# Patient Record
Sex: Female | Born: 1960 | Race: White | Hispanic: No | Smoking: Former smoker
Health system: Southern US, Community
[De-identification: ages and names within clinical notes are randomized; demographics above are authoritative.]

## PROBLEM LIST (undated history)

## (undated) DIAGNOSIS — J309 Allergic rhinitis, unspecified: Secondary | ICD-10-CM

## (undated) DIAGNOSIS — J45909 Unspecified asthma, uncomplicated: Secondary | ICD-10-CM

## (undated) DIAGNOSIS — R053 Chronic cough: Secondary | ICD-10-CM

## (undated) DIAGNOSIS — R112 Nausea with vomiting, unspecified: Secondary | ICD-10-CM

## (undated) DIAGNOSIS — E039 Hypothyroidism, unspecified: Secondary | ICD-10-CM

## (undated) DIAGNOSIS — Z9889 Other specified postprocedural states: Secondary | ICD-10-CM

## (undated) DIAGNOSIS — IMO0002 Reserved for concepts with insufficient information to code with codable children: Secondary | ICD-10-CM

## (undated) DIAGNOSIS — D219 Benign neoplasm of connective and other soft tissue, unspecified: Secondary | ICD-10-CM

## (undated) DIAGNOSIS — R87619 Unspecified abnormal cytological findings in specimens from cervix uteri: Secondary | ICD-10-CM

## (undated) DIAGNOSIS — M1990 Unspecified osteoarthritis, unspecified site: Secondary | ICD-10-CM

## (undated) HISTORY — DX: Unspecified abnormal cytological findings in specimens from cervix uteri: R87.619

## (undated) HISTORY — PX: KNEE SURGERY: SHX244

## (undated) HISTORY — DX: Unspecified osteoarthritis, unspecified site: M19.90

## (undated) HISTORY — DX: Benign neoplasm of connective and other soft tissue, unspecified: D21.9

## (undated) HISTORY — DX: Reserved for concepts with insufficient information to code with codable children: IMO0002

## (undated) HISTORY — PX: APPENDECTOMY: SHX54

---

## 1999-12-12 HISTORY — PX: APPENDECTOMY: SHX54

## 2005-03-13 HISTORY — PX: OTHER SURGICAL HISTORY: SHX169

## 2005-09-29 ENCOUNTER — Ambulatory Visit: Payer: Self-pay | Admitting: Gynecology

## 2005-10-09 ENCOUNTER — Ambulatory Visit: Payer: Self-pay | Admitting: Gynecology

## 2005-10-09 ENCOUNTER — Encounter (INDEPENDENT_AMBULATORY_CARE_PROVIDER_SITE_OTHER): Payer: Self-pay | Admitting: Gynecology

## 2006-01-04 ENCOUNTER — Ambulatory Visit: Payer: Self-pay | Admitting: Specialist

## 2006-01-26 ENCOUNTER — Ambulatory Visit: Payer: Self-pay | Admitting: Specialist

## 2006-09-27 ENCOUNTER — Encounter: Payer: Self-pay | Admitting: Obstetrics & Gynecology

## 2006-09-27 ENCOUNTER — Ambulatory Visit: Payer: Self-pay | Admitting: Obstetrics & Gynecology

## 2006-10-01 ENCOUNTER — Ambulatory Visit: Payer: Self-pay | Admitting: Obstetrics & Gynecology

## 2006-10-01 ENCOUNTER — Encounter: Admission: RE | Admit: 2006-10-01 | Discharge: 2006-10-01 | Payer: Self-pay | Admitting: Obstetrics & Gynecology

## 2006-10-10 ENCOUNTER — Encounter: Admission: RE | Admit: 2006-10-10 | Discharge: 2006-10-10 | Payer: Self-pay | Admitting: Obstetrics & Gynecology

## 2006-10-18 ENCOUNTER — Ambulatory Visit: Payer: Self-pay | Admitting: Obstetrics & Gynecology

## 2006-10-25 ENCOUNTER — Ambulatory Visit: Payer: Self-pay | Admitting: Obstetrics & Gynecology

## 2007-05-07 ENCOUNTER — Encounter: Payer: Self-pay | Admitting: Obstetrics & Gynecology

## 2007-05-07 ENCOUNTER — Ambulatory Visit: Payer: Self-pay | Admitting: Obstetrics & Gynecology

## 2007-08-12 ENCOUNTER — Ambulatory Visit: Payer: Self-pay | Admitting: Family Medicine

## 2007-10-08 ENCOUNTER — Encounter: Admission: RE | Admit: 2007-10-08 | Discharge: 2007-10-08 | Payer: Self-pay | Admitting: Gynecology

## 2007-11-19 ENCOUNTER — Encounter (INDEPENDENT_AMBULATORY_CARE_PROVIDER_SITE_OTHER): Payer: Self-pay | Admitting: Gynecology

## 2007-11-19 ENCOUNTER — Ambulatory Visit: Payer: Self-pay | Admitting: Gynecology

## 2008-09-15 ENCOUNTER — Ambulatory Visit: Payer: Self-pay | Admitting: Family Medicine

## 2008-10-21 ENCOUNTER — Encounter: Admission: RE | Admit: 2008-10-21 | Discharge: 2008-10-21 | Payer: Self-pay | Admitting: Obstetrics & Gynecology

## 2008-11-24 ENCOUNTER — Ambulatory Visit: Payer: Self-pay | Admitting: Family Medicine

## 2008-11-24 ENCOUNTER — Encounter: Payer: Self-pay | Admitting: Family Medicine

## 2009-10-27 ENCOUNTER — Encounter: Admission: RE | Admit: 2009-10-27 | Discharge: 2009-10-27 | Payer: Self-pay | Admitting: Obstetrics & Gynecology

## 2010-03-01 ENCOUNTER — Ambulatory Visit: Payer: Self-pay | Admitting: Family Medicine

## 2010-03-01 ENCOUNTER — Encounter: Payer: Self-pay | Admitting: Family Medicine

## 2010-03-01 HISTORY — PX: COLPOSCOPY: SHX161

## 2010-03-01 LAB — CONVERTED CEMR LAB
ALT: 17 units/L (ref 0–35)
AST: 19 units/L (ref 0–37)
Albumin: 4.5 g/dL (ref 3.5–5.2)
Alkaline Phosphatase: 73 units/L (ref 39–117)
BUN: 13 mg/dL (ref 6–23)
CO2: 26 meq/L (ref 19–32)
Calcium: 8.9 mg/dL (ref 8.4–10.5)
Chloride: 105 meq/L (ref 96–112)
Creatinine, Ser: 0.7 mg/dL (ref 0.40–1.20)
Glucose, Bld: 83 mg/dL (ref 70–99)
HCT: 41.2 % (ref 36.0–46.0)
Hemoglobin: 13.5 g/dL (ref 12.0–15.0)
MCHC: 32.8 g/dL (ref 30.0–36.0)
MCV: 89.4 fL (ref 78.0–100.0)
Platelets: 311 10*3/uL (ref 150–400)
Potassium: 4.1 meq/L (ref 3.5–5.3)
RBC: 4.61 M/uL (ref 3.87–5.11)
RDW: 13.2 % (ref 11.5–15.5)
Sodium: 141 meq/L (ref 135–145)
TSH: 3.294 microintl units/mL (ref 0.350–4.500)
Total Bilirubin: 0.5 mg/dL (ref 0.3–1.2)
Total Protein: 6.8 g/dL (ref 6.0–8.3)
WBC: 9.5 10*3/uL (ref 4.0–10.5)

## 2010-04-03 ENCOUNTER — Encounter: Payer: Self-pay | Admitting: Obstetrics & Gynecology

## 2010-07-26 NOTE — Assessment & Plan Note (Signed)
NAME:  Danielle Duarte, Danielle Duarte                ACCOUNT NO.:  [id number]   MEDICAL RECORD NO.:  [id number]          PATIENT TYPE:  POB   LOCATION:  [facility]         FACILITY:  [facility]   PHYSICIAN:  Tinnie Gens, MD        DATE OF BIRTH:  08/15/1960   DATE OF SERVICE:  08/12/2007                                  CLINIC NOTE   CHIEF COMPLAINT:  Repeat IUD insertion.   HISTORY OF PRESENT ILLNESS:  The patient is a 50 year old gravida  __________ , para 2, who has an IUD in for the last 5 years who desires  repeat.  She has no specific complaints today.  The patient has a  history of abnormal Pap smear, underwent a colposcopy in August 2008,  had a repeat Pap in February 2009, which showed benign reparative  changes, is due for a followup in August 2009.   PHYSICAL EXAMINATION:  VITAL SIGNS:  As noted in the chart.  GENERAL:  She is a well-developed, well-nourished female in no acute  distress.  ABDOMEN:  Soft, nontender, and nondistended.  GU:  Normal external female genitalia.  BUS normal.  Vagina is pink and  rugated.  Cervix is parous without lesion.  Strings are not visualized.   PROCEDURE:  A curved Nicholaus Bloom was used to reach the internal os.  The  strings were easily grasped.  The IUD removed without difficulty.  The  cervix was then cleaned with Betadine x2.  Single-tooth tenaculum was  applied to the anterior cervix.  The uterus sounded to 9 cm, Mirena IUD  was inserted without difficulty.  Strings were trimmed to 2 cm.  Bleeding at the point of the single-tooth tenaculum site was stopped  with pressure from __________ .  The patient tolerated the procedure  well.   IMPRESSION:  Intrauterine device removal with reinsertion.   PLAN:  1. Followup Pap in August 2009.  2. The patient is advised to check her strings over the next 2 weeks.           ______________________________  Tinnie Gens, MD     TP/MEDQ  D:  08/12/2007  T:  08/13/2007  Job:  [id number]

## 2010-07-26 NOTE — Assessment & Plan Note (Signed)
NAME:  Danielle Duarte, SKILTON NO.:  [id number]   MEDICAL RECORD NO.:  [id number]          PATIENT TYPE:  POB   LOCATION:  [facility]         FACILITY:  [facility]   PHYSICIAN:  Elsie Lincoln, MD      DATE OF BIRTH:  1961/01/16   DATE OF SERVICE:                                  CLINIC NOTE   The patient is a 50 year old female who presents for followup of colpo.  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  Her colpo showed  Nabothian cysts and mild acetowhite.  The biopsy showed cervicitis.  This could be secondary to her IUD.  We had a detailed conversation  about HPV both for her and her daughter, and the patient has a better  understanding.  She is going to stay away from secondhand smoke now to  hopefully increase her immune system so she can clear the virus.  The  patient also had a BI-RADS-0 screening mammogram on 10/01/06.  She had  follow-up views of the left breast within normal limits, per the  patient.  We need this report.  She just started Weight Watchers which  is also going to help her elevated LDL cholesterol.  This patient needs  to come back in 6 months for a follow-up Pap smear.           ______________________________  Elsie Lincoln, MD     KL/MEDQ  D:  10/25/2006  T:  10/26/2006  Job:  [id number]

## 2010-07-26 NOTE — Assessment & Plan Note (Signed)
NAME:  Danielle Duarte, Danielle Duarte NO.:  [id number]   MEDICAL RECORD NO.:  [id number]          PATIENT TYPE:  POB   LOCATION:  [facility]         FACILITY:  [facility]   PHYSICIAN:  Ginger Carne, MD DATE OF BIRTH:  [birth year]   DATE OF SERVICE:  11/19/2007                                  CLINIC NOTE   Danielle Duarte is a 50 year old Caucasian female here for routine yearly  examination.  She has no specific complaints and had a Mirena IUD  inserted on August 12, 2007.  Her menses are very light and she is  satisfied with said IUD.  Otherwise, she denies cardiovascular,  genitourinary, gastrointestinal, or gynecologic complaints.  The patient  is on no medications.   SALIENT PHYSICAL FINDINGS:  VITAL SIGNS:  Blood pressure is 132/80,  weight 152 pounds, height 5 feet 4 inches, and pulse 61 and regular.  HEENT:  Grossly normal.  BREASTS:  Without masses, discharge, thickenings, or tenderness.  CHEST:  Clear to percussion and auscultation.  CARDIOVASCULAR:  Without murmurs or enlargements.  Regular rate and  rhythm.  EXTREMITIES:  Within normal limits.  LYMPHATICS:  Within normal limits.  SKIN:  Within normal limits.  NEUROLOGIC:  Within normal limits.  MUSCULOSKELETAL:  Within normal limits.  ABDOMEN:  Soft without gross hepatosplenomegaly.  PELVIC:  Pap smear performed.  External genitalia, vulva, and vagina are  normal.  Cervix smooth without erosions or lesions.  Uterus is small,  anteverted, and flexed.  Both adnexa palpable and found to be normal.  Pap smear performed.   IMPRESSION:  Normal gynecologic exam.   PLAN:  The patient had a normal mammogram performed on October 08, 2007.  She was advised that after age of 55, it is recommended to have a  screening colonoscopy.  Otherwise, no other issues have arisen during  this examination.  She will return in 1 year.           ______________________________  Ginger Carne, MD     SHB/MEDQ  D:  11/19/2007   T:  11/20/2007  Job:  [id number]

## 2010-07-26 NOTE — Assessment & Plan Note (Signed)
NAME:  Danielle Duarte, Danielle Duarte NO.:  [id number]   MEDICAL RECORD NO.:  [id number]          PATIENT TYPE:  POB   LOCATION:  [facility]         FACILITY:  [facility]   PHYSICIAN:  Elsie Lincoln, MD      DATE OF BIRTH:  [birth year]   DATE OF SERVICE:  09/27/2006                                  CLINIC NOTE   Patient is a 50 year old para 2 female who presents for her annual exam.  She has an IUD in place for 4 years and it is due to come out next year.  She would like another one replaced and I told her that this is safe.  She is not having any hot flashes or flushes.  She has had a difficult  year this past calendar year secondary to knee pain and problems in  surgery and rehab from that.  She would like to lose some weight and is  going to look into water walking since her knee is causing her problems.  She does have an occasional loss of urine and she was instructed on  Kegel.  She is not interested in surgical intervention at this time.  She does complain of diarrhea after eating most ethnic type meals,  however chronic diarrhea is not normal and needs to be investigated.  Her dad has just been diagnosed with Crohn disease so this could be  worrisome for her as well.  The patient also complains of dark hairs on  her nipples and I explained to her that this can be normal, although  bothersome.  I referred her to [facility] in  [place].   PAST MEDICAL HISTORY:  Denies any major problems.   PAST SURGICAL HISTORY:  Knee surgery and an appendectomy.   GYN HISTORY:  Questionable history of ovarian cyst, fibroid tumors  during her last pregnancy, however, she cannot remember if it was on her  uterus or her ovary.  She has had 2 vaginal deliveries.  She is sexually  active and not having problems.  Mirena for contraception.  No history  of abnormal pap smears.   FAMILY HISTORY:  New diagnosis of Crohn's in her father.   MEDICATIONS:  None.   ALLERGIES:  None.   REVIEW OF SYSTEMS:  As above, otherwise negative.   Pulse 66, blood pressure 134/80, weight 172, height 5 foot 4.  GENERAL:  Well-nourished, well-developed, no apparent distress.  HEENT:  Normocephalic, atraumatic, good dentition.  THYROID:  No masses.  LUNGS:  Clear to auscultation bilaterally.  HEART:  Regular rate and rhythm.  BREASTS:  No masses, nontender, no skin changes.  Self breast exam  encouraged.  ABDOMEN:  Soft, nontender, slightly obese, no hernia, no organomegaly.  GENITALIA:  Tanner 5; vagina pink, normal rugae; not able to visualize  IUD strings but can feel them just inside the endocervical canal.  Uterus slightly enlarged, mobile, nontender.  Adnexa no masses,  nontender.  Rectovaginal:  no masses.  No hemorrhoids.  No cystocele, no  rectocele.   ASSESSMENT AND PLAN:  A 50 year old female for a well woman exam:  1. Pap smear.  2. Go to primary care doctor for  evaluation of diarrhea.  3. Calcium supplementation reviewed.  4. Mammogram ordered.  5. Patient wants to come in for cholesterol, TSH and glucose      screening.  6. Refer to laser for hair removal.  7. Kegel's for loss of urine.  8. Return to clinic in a year.           ______________________________  Elsie Lincoln, MD     KL/MEDQ  D:  09/27/2006  T:  09/27/2006  Job:  [id number]

## 2010-07-26 NOTE — Assessment & Plan Note (Signed)
NAME:  Danielle Duarte, BERTONI NO.:  [id number]   MEDICAL RECORD NO.:  [id number]          PATIENT TYPE:  POB   LOCATION:  [facility]         FACILITY:  [facility]   PHYSICIAN:  Tinnie Gens, MD        DATE OF BIRTH:  04/07/1960   DATE OF SERVICE:  03/01/2010                                  CLINIC NOTE   CHIEF COMPLAINT:  Yearly exam.   HISTORY OF PRESENT ILLNESS:  The patient is a 50 year old gravida 2,  para 2 who was seen a year ago.  She is doing well.  She had her IUD  removed then because of fear of breast cancer and her cycles have come  back.  Previously, prior to getting her IUD put in, they are heavy.  She  bleeds for approximately 7 days and are monthly.  She has had abnormal  thyroid function testing over the summer and would like to have this  repeated.  She had a mammogram in August which was normal.   PAST MEDICAL HISTORY:  Negative.   PAST SURGICAL HISTORY:  Knee surgery and appendectomy.   MEDICATIONS:  None.   ALLERGIES:  None known.   OBSTETRICAL HISTORY:  Vaginal delivery x2.   GYN HISTORY:  Questionable history of fibroid tumor.   FAMILY HISTORY:  Crohn disease.   SOCIAL HISTORY:  No tobacco, alcohol or drug use.  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   Fourteen-point review of systems is reviewed and she is negative for  chest pain, shortness of breath, abdominal pain, nausea, vomiting,  diarrhea, constipation, blood in her stool, blood in her urine, vaginal  discharge, breast mass or lump.   PHYSICAL EXAMINATION:  VITAL SIGNS:  Noted in the chart.  GENERAL:  She is a well-developed, well-nourished female in no acute  stress.  HEENT:  Normocephalic, atraumatic.  Sclerae anicteric.  NECK:  Supple.  Normal thyroid.  LUNGS:  Clear bilaterally.  CV:  Regular rate and rhythm.  No rubs, gallops, murmurs.  ABDOMEN:  Soft, nontender, nondistended.  GU:  Normal external female genitalia.  BUS normal.  Vagina is pink and  rugated.  Cervix is parous with cervical polyp noted.  Uterus is small,  anteverted.  No adnexal mass or tenderness.  EXTREMITIES:  No cyanosis, clubbing or edema.  BREASTS:  Symmetric with everted nipples.  No masses.  No  supraclavicular or axillary adenopathy.   PROCEDURE:  Cervical polyp was removed by ring forceps with constant  twisting.   IMPRESSION:  1. Physical exam.  2. Cervical polyp status post removal.  3. Continue monthly cycles.  4. History of abnormal thyroid testing.   PLAN:  1. Pap smear today.  2. Yearly mammography.  3. Referral for colonoscopy.  4. TSH, CBC and CMP today.  5. Cervical polyp to Pathology.           ______________________________  Tinnie Gens, MD     TP/MEDQ  D:  03/01/2010  T:  03/02/2010  Job:  [id number]

## 2010-07-26 NOTE — Assessment & Plan Note (Signed)
NAME:  Danielle Duarte, Danielle Duarte NO.:  [id number]   MEDICAL RECORD NO.:  [id number]          PATIENT TYPE:  POB   LOCATION:  [facility]         FACILITY:  [facility]   PHYSICIAN:  Tinnie Gens, MD        DATE OF BIRTH:  September 14, 1960   DATE OF SERVICE:                                  CLINIC NOTE   CHIEF COMPLAINT:  Physical exam.   HISTORY OF PRESENT ILLNESS:  The patient is a 50 year old gravida 2,  para 2 who has had an IUD for the past 7 years who desires to have this  removed.  She wants it removed because her sister was diagnosed with  breast cancer.  She has been without cycles, so she had her first IUD  put in.  She is otherwise without significant complaint today.  She had  a mammogram in August, which was normal and she knows she needs a  colposcopy this year.   PAST MEDICAL HISTORY:  Negative.   PAST SURGICAL HISTORY:  Knee surgery and appendectomy.   MEDICATIONS:  None.   ALLERGIES:  None.   OBSTETRICAL HISTORY:  Two previous vaginal deliveries.   GYN HISTORY:  Questionable history of fibroid tumors.   FAMILY HISTORY:  Crohn's disease.   SOCIAL HISTORY:  No tobacco, alcohol, or drug use.  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   A 14-point review of systems reviewed, negative for breast mass or lump,  chest pain, shortness of breath, abdominal pain, blood in her stool,  blood in her urine, vaginal bleeding, vaginal discharge, nausea,  vomiting, diarrhea, constipation.   PHYSICAL EXAMINATION:  VITAL SIGNS:  Blood pressure is 134/81, weight is  165.  GENERAL:  She is a well-nourished female in no acute stress.  HEENT:  Normocephalic, atraumatic.  Sclerae anicteric.  NECK:  Supple.  Normal thyroid.  LUNGS:  Clear bilaterally.  CV:  Regular rate and rhythm.  No rubs, gallops, murmurs.  ABDOMEN:  Soft, nontender, nondistended.  EXTREMITIES:  No cyanosis, clubbing, or edema.  BREASTS:  Symmetric with everted nipples.  No  masses.  No  supraclavicular or axillary adenopathy.  GU:  Normal external female genitalia.  BUS normal.  Vagina is pink and  rugated.  Cervix is parous without lesion.  Uterus is small, anteverted.  No adnexal mass or tenderness.   IMPRESSION:  Yearly physical exam and gynecologic exam.  There is a new  family diagnosis of breast cancer, not blood related   PLAN:  1. Pap smear today.  2. Mammogram yearly.  3. Colonoscopy yearly.   PROCEDURE:  The patient desires removal of her IUD.  At that time a Pap  smear, the speculum showed 2 cm IUD string.  This was grasped with a  ring forceps and IUD removed intact and without difficulty.  The patient  tolerated the procedure well.           ______________________________  Tinnie Gens, MD     TP/MEDQ  D:  11/24/2008  T:  11/25/2008  Job:  [id number]

## 2010-10-10 HISTORY — PX: COLONOSCOPY: SHX174

## 2010-10-10 LAB — HM COLONOSCOPY

## 2011-04-04 ENCOUNTER — Other Ambulatory Visit: Payer: Self-pay | Admitting: Obstetrics and Gynecology

## 2011-04-04 DIAGNOSIS — Z1231 Encounter for screening mammogram for malignant neoplasm of breast: Secondary | ICD-10-CM

## 2011-04-25 ENCOUNTER — Ambulatory Visit
Admission: RE | Admit: 2011-04-25 | Discharge: 2011-04-25 | Disposition: A | Discharge status: Home / Self Care | Payer: BC Managed Care – PPO | Source: Ambulatory Visit | Attending: Obstetrics and Gynecology | Admitting: Obstetrics and Gynecology

## 2011-04-25 DIAGNOSIS — Z1231 Encounter for screening mammogram for malignant neoplasm of breast: Secondary | ICD-10-CM

## 2011-05-02 ENCOUNTER — Encounter: Payer: Self-pay | Admitting: Obstetrics and Gynecology

## 2011-05-02 ENCOUNTER — Ambulatory Visit (INDEPENDENT_AMBULATORY_CARE_PROVIDER_SITE_OTHER): Payer: BC Managed Care – PPO | Admitting: Obstetrics and Gynecology

## 2011-05-02 VITALS — BP 119/83 | HR 75 | Ht 65.0 in | Wt 179.0 lb

## 2011-05-02 DIAGNOSIS — Z1151 Encounter for screening for human papillomavirus (HPV): Secondary | ICD-10-CM

## 2011-05-02 DIAGNOSIS — Z01419 Encounter for gynecological examination (general) (routine) without abnormal findings: Secondary | ICD-10-CM

## 2011-05-02 DIAGNOSIS — Z1272 Encounter for screening for malignant neoplasm of vagina: Secondary | ICD-10-CM

## 2011-05-02 NOTE — Progress Notes (Signed)
Addended by: Barbara Cower on: 05/02/2011 05:06 PM   Modules accepted: Orders

## 2011-05-02 NOTE — Progress Notes (Signed)
  Subjective:    Patient ID: Danielle Duarte, female    DOB: 01-17-1961, 51 y.o.   MRN: [id number]  HPI  51 yo G2P2 postmenopausal with BMI here for annual exam. Patient is doing well. Patient reports losing urine with coughing on occasions. Patient is not currently sexually active.   Review of Systems  All other systems reviewed and are negative.       Objective:   Physical Exam GENERAL: Well-developed, well-nourished female in no acute distress.  HEENT: Normocephalic, atraumatic. Sclerae anicteric.  NECK: Supple. Normal thyroid.  LUNGS: Clear to auscultation bilaterally.  HEART: Regular rate and rhythm. BREASTS: Symmetric in size. No palpable masses or lymphadenopathy, skin changes, or nipple drainage. ABDOMEN: Soft, nontender, nondistended. No organomegaly. PELVIC: Normal external female genitalia. Vagina is pink and rugated.  Normal discharge. Normal appearing cervix. Uterus is normal in size. No adnexal mass or tenderness.Grade I cystocele EXTREMITIES: No cyanosis, clubbing, or edema, 2+ distal pulses.     Assessment & Plan:  51 yo here for annual exam - pap smear was performed - wet prep collected at patient request - patient advised to continue monthly self breast and vulva exam - Medical vs surgical management of stress incontinence discussed with patient. Patient desires to try Kegel exercises first and will return in 6 months to a year if she feels her incontinence is worsening.

## 2011-05-03 LAB — WET PREP, GENITAL
Trich, Wet Prep: NONE SEEN
Yeast Wet Prep HPF POC: NONE SEEN

## 2011-05-04 LAB — URINE CULTURE: Colony Count: 5000

## 2011-05-04 MED ORDER — METRONIDAZOLE 500 MG PO TABS: 500.0000 mg | ORAL_TABLET | Freq: Two times a day (BID) | ORAL | Status: DC

## 2011-05-04 NOTE — Progress Notes (Signed)
Addended by: Catalina Antigua on: 05/04/2011 08:19 AM   Modules accepted: Orders

## 2011-05-09 ENCOUNTER — Telehealth: Payer: Self-pay | Admitting: *Deleted

## 2011-05-09 MED ORDER — METRONIDAZOLE 500 MG PO TABS: 500.0000 mg | ORAL_TABLET | Freq: Two times a day (BID) | ORAL | Status: AC

## 2011-05-09 NOTE — Telephone Encounter (Signed)
Patient is having no symptoms and will hold off on treatment as she is out of town for a funeral right now and if she begins to have symptoms she will call us to get treatment.

## 2011-05-09 NOTE — Telephone Encounter (Signed)
Patient changed her mind and would like to have the medicine called into a pharmacy in [place].

## 2011-05-10 ENCOUNTER — Telehealth: Payer: Self-pay | Admitting: *Deleted

## 2011-05-10 NOTE — Telephone Encounter (Signed)
Patient needs to have rx cancled here to be able to pick it up in [place].

## 2011-05-22 ENCOUNTER — Encounter: Payer: BC Managed Care – PPO | Admitting: *Deleted

## 2011-05-22 DIAGNOSIS — Z01419 Encounter for gynecological examination (general) (routine) without abnormal findings: Secondary | ICD-10-CM

## 2011-05-23 LAB — COMPREHENSIVE METABOLIC PANEL
ALT: 18 U/L (ref 0–35)
AST: 20 U/L (ref 0–37)
Albumin: 4.1 g/dL (ref 3.5–5.2)
Alkaline Phosphatase: 61 U/L (ref 39–117)
BUN: 11 mg/dL (ref 6–23)
CO2: 25 mEq/L (ref 19–32)
Calcium: 8.5 mg/dL (ref 8.4–10.5)
Chloride: 105 mEq/L (ref 96–112)
Creat: 0.66 mg/dL (ref 0.50–1.10)
Glucose, Bld: 85 mg/dL (ref 70–99)
Potassium: 4.3 mEq/L (ref 3.5–5.3)
Sodium: 143 mEq/L (ref 135–145)
Total Bilirubin: 0.5 mg/dL (ref 0.3–1.2)
Total Protein: 6.1 g/dL (ref 6.0–8.3)

## 2011-05-23 LAB — LIPID PANEL
Cholesterol: 190 mg/dL (ref 0–200)
HDL: 63 mg/dL (ref >39)
LDL Cholesterol: 114 mg/dL — ABNORMAL HIGH (ref 0–99)
Total CHOL/HDL Ratio: 3 Ratio
Triglycerides: 64 mg/dL (ref <150)
VLDL: 13 mg/dL (ref 0–40)

## 2011-05-23 LAB — FOLLICLE STIMULATING HORMONE: FSH: 63.1 m[IU]/mL

## 2011-05-23 LAB — TSH: TSH: 4.822 u[IU]/mL — ABNORMAL HIGH (ref 0.350–4.500)

## 2011-12-08 ENCOUNTER — Ambulatory Visit (INDEPENDENT_AMBULATORY_CARE_PROVIDER_SITE_OTHER): Payer: BC Managed Care – PPO | Admitting: Obstetrics & Gynecology

## 2011-12-08 ENCOUNTER — Encounter: Payer: Self-pay | Admitting: Obstetrics & Gynecology

## 2011-12-08 VITALS — BP 135/83 | HR 51 | Ht 65.0 in | Wt 181.0 lb

## 2011-12-08 DIAGNOSIS — N949 Unspecified condition associated with female genital organs and menstrual cycle: Secondary | ICD-10-CM

## 2011-12-08 DIAGNOSIS — R7989 Other specified abnormal findings of blood chemistry: Secondary | ICD-10-CM

## 2011-12-08 DIAGNOSIS — Z01812 Encounter for preprocedural laboratory examination: Secondary | ICD-10-CM

## 2011-12-08 DIAGNOSIS — N951 Menopausal and female climacteric states: Secondary | ICD-10-CM

## 2011-12-08 DIAGNOSIS — N938 Other specified abnormal uterine and vaginal bleeding: Secondary | ICD-10-CM

## 2011-12-08 LAB — CBC
HCT: 42.1 % (ref 36.0–46.0)
Hemoglobin: 14.2 g/dL (ref 12.0–15.0)
MCH: 29.8 pg (ref 26.0–34.0)
MCHC: 33.7 g/dL (ref 30.0–36.0)
MCV: 88.4 fL (ref 78.0–100.0)
Platelets: 305 10*3/uL (ref 150–400)
RBC: 4.76 MIL/uL (ref 3.87–5.11)
RDW: 13.6 % (ref 11.5–15.5)
WBC: 7.2 10*3/uL (ref 4.0–10.5)

## 2011-12-08 LAB — POCT URINE PREGNANCY: Preg Test, Ur: NEGATIVE

## 2011-12-08 NOTE — Progress Notes (Signed)
51 y.o. G2P2 with DUB, possible perimenopausal or postmenopausal bleeding.  05/22/11 FSH elevated at 63.1, concerning for possible menopausal status.  Also TSH elevated at 4.822 concerning for thyroid dysfuntion. Patient has not gone for one year without bleeding.  This year, she had a period in 03/2011, then in July, then in September. She described changing a pad every 2 hours for the first two days of her period, denies any symptoms of anemia. No other symptoms.    I have reviewed the patient's other medical history in detail and updated the computerized patient record.  Last pap in 04/2011 was ASCUS, negative HPV, will repeat in 04/2012. Normal mammogram in 04/2011.  Recent Results   LIPID PANEL   Collection Time   05/22/11 10:05 AM      Component Value Range   Cholesterol 190  0 - 200 mg/dL   Triglycerides 64  <454 mg/dL   HDL 63  >09 mg/dL   Total CHOL/HDL Ratio 3.0     VLDL 13  0 - 40 mg/dL   LDL Cholesterol 811 (*) 0 - 99 mg/dL  COMPREHENSIVE METABOLIC PANEL   Collection Time   05/22/11 10:05 AM      Component Value Range   Sodium 143  135 - 145 mEq/L   Potassium 4.3  3.5 - 5.3 mEq/L   Chloride 105  96 - 112 mEq/L   CO2 25  19 - 32 mEq/L   Glucose, Bld 85  70 - 99 mg/dL   BUN 11  6 - 23 mg/dL   Creat 9.14  7.82 - 9.56 mg/dL   Total Bilirubin 0.5  0.3 - 1.2 mg/dL   Alkaline Phosphatase 61  39 - 117 U/L   AST 20  0 - 37 U/L   ALT 18  0 - 35 U/L   Total Protein 6.1  6.0 - 8.3 g/dL   Albumin 4.1  3.5 - 5.2 g/dL   Calcium 8.5  8.4 - 21.3 mg/dL  FOLLICLE STIMULATING HORMONE   Collection Time   05/22/11 10:05 AM      Component Value Range   FSH 63.1    TSH   Collection Time   05/22/11 10:05 AM      Component Value Range   TSH 4.822 (*) 0.350 - 4.500 uIU/mL   ENDOMETRIAL BIOPSY     The indications for endometrial biopsy were reviewed.   Risks of the biopsy including cramping, bleeding, infection, uterine perforation, inadequate specimen and need for additional procedures  were  discussed. The patient states she understands and agrees to undergo procedure today. Consent was signed. Time out was performed. Urine HCG was negative. A sterile speculum was placed in the patient's vagina and the cervix was prepped with Betadine.  The 3 mm pipelle was introduced into the endometrial cavity without difficulty to a depth of 8cm, and a moderate amount of tissue was obtained and sent to pathology. The instruments were removed from the patient's vagina. Minimal bleeding from the cervix was noted. The patient tolerated the procedure well.    Routine post-procedure instructions were given to the patient. The patient will follow up to review the results and for further management. Pelvic ultrasound ordered to rule out structural anomalies Repeat TSH, T3, T4, FSH and CBC ordered, will follow up results and manage accordingly. Routine preventative health maintenance measures emphasized. Bleeding precautions reviewed.

## 2011-12-08 NOTE — Patient Instructions (Signed)
Endometrial Biopsy This is a test in which a tissue sample (a biopsy) is taken from inside the uterus (womb). It is then looked at by a specialist under a microscope to see if the tissue is normal or abnormal. The endometrium is the lining of the uterus. This test helps determine where you are in your menstrual cycle and how hormone levels are affecting the lining of the uterus. Another use for this test is to diagnose endometrial cancer, tuberculosis, polyps, or inflammatory conditions and to evaluate uterine bleeding. PREPARATION FOR TEST No preparation or fasting is necessary. NORMAL FINDINGS No pathologic conditions. Presence of "secretory-type" endometrium 3 to 5 days before to normal menstruation. Ranges for normal findings may vary among different laboratories and hospitals. You should always check with your doctor after having lab work or other tests done to discuss the meaning of your test results and whether your values are considered within normal limits. MEANING OF TEST  Your caregiver will go over the test results with you and discuss the importance and meaning of your results, as well as treatment options and the need for additional tests if necessary. OBTAINING THE TEST RESULTS It is your responsibility to obtain your test results. Ask the lab or department performing the test when and how you will get your results. Document Released: 06/30/2004 Document Revised: 02/16/2011 Document Reviewed: 02/07/2008 Exit[employer] Patient Information 2012 [employer]. 

## 2011-12-09 LAB — T4, FREE: Free T4: 1.09 ng/dL (ref 0.80–1.80)

## 2011-12-09 LAB — T3, FREE: T3, Free: 3 pg/mL (ref 2.3–4.2)

## 2011-12-09 LAB — TSH: TSH: 3.392 u[IU]/mL (ref 0.350–4.500)

## 2011-12-09 LAB — FOLLICLE STIMULATING HORMONE: FSH: 30.3 m[IU]/mL

## 2011-12-11 ENCOUNTER — Encounter: Payer: Self-pay | Admitting: Obstetrics & Gynecology

## 2011-12-11 DIAGNOSIS — D279 Benign neoplasm of unspecified ovary: Secondary | ICD-10-CM | POA: Insufficient documentation

## 2011-12-13 ENCOUNTER — Telehealth: Payer: Self-pay | Admitting: Gynecology

## 2011-12-13 NOTE — Telephone Encounter (Signed)
Patient called office regarding her result. Patient aware of biopsy result, lab result and ultrasound.Patient is schedule to have a MRI Set up by susan Training and development officer) Pt will also come in office next Wednesday for her CA-125 Level.

## 2011-12-18 ENCOUNTER — Other Ambulatory Visit (INDEPENDENT_AMBULATORY_CARE_PROVIDER_SITE_OTHER): Payer: BC Managed Care – PPO | Admitting: *Deleted

## 2011-12-18 DIAGNOSIS — R9389 Abnormal findings on diagnostic imaging of other specified body structures: Secondary | ICD-10-CM

## 2011-12-19 LAB — CA 125: CA 125: 10.2 U/mL (ref 0.0–30.2)

## 2011-12-20 ENCOUNTER — Telehealth: Payer: Self-pay | Admitting: *Deleted

## 2011-12-20 NOTE — Telephone Encounter (Signed)
Patient is notified by phone of normal Ca-125.

## 2011-12-21 ENCOUNTER — Telehealth: Payer: Self-pay

## 2011-12-21 NOTE — Telephone Encounter (Signed)
This patient needed pre authorization for her pelvic MRI with and without [facility] called requested the pre-auth number. She was approved from 12/21/11 - 01/19/12 approval number is [id number]. I called [facility] and gave the approval information to [facility].

## 2011-12-22 ENCOUNTER — Telehealth: Payer: Self-pay

## 2011-12-22 NOTE — Telephone Encounter (Signed)
I got a call from [facility] imaging regarding this patients pre- Auth. The [employer] representatives put the incorrect test therefore I had to call [employer] and create another authorization. It was approved #[id number] 12/22/11- 01/20/12. Help by Merri Brunette

## 2011-12-28 ENCOUNTER — Telehealth: Payer: Self-pay | Admitting: *Deleted

## 2011-12-28 NOTE — Telephone Encounter (Signed)
Patient called and is having pain in her back that shoots to her knee, she is wondering if this could be related to what is going on with her ovary, the pain is on the left side and its the right ovary that is affected.  Patient was advised to go to the emergency room if the pain was that severe and she said she would rather maybe see a chiropractor.  I gave her the name of Dr. Yetta Flock in [place] and he has had someone to cancel this afternoon and will be happy to see if he can assist.

## 2012-01-02 ENCOUNTER — Ambulatory Visit (INDEPENDENT_AMBULATORY_CARE_PROVIDER_SITE_OTHER): Payer: BC Managed Care – PPO | Admitting: Obstetrics & Gynecology

## 2012-01-02 ENCOUNTER — Encounter: Payer: Self-pay | Admitting: Obstetrics & Gynecology

## 2012-01-02 VITALS — BP 140/87 | HR 55 | Ht 67.0 in | Wt 181.0 lb

## 2012-01-02 DIAGNOSIS — N83209 Unspecified ovarian cyst, unspecified side: Secondary | ICD-10-CM

## 2012-01-02 DIAGNOSIS — N393 Stress incontinence (female) (male): Secondary | ICD-10-CM

## 2012-01-02 NOTE — Progress Notes (Signed)
  Subjective:    Patient ID: Danielle Duarte, female    DOB: 07/29/1960, 51 y.o.   MRN: [id number]  HPI  She is here to discuss her work up. MRI confirms U/S findings of a 6-7 cm left ovarian cyst. CA-125 and TFTs are normal. EMBX normal. She also complains GSUI (no urge incontinence). She wears pads when she wears light colored pants.   Review of Systems     Objective:   Physical Exam  + Q tip test 1st degree cystocele  No vaginal atrophy    Assessment & Plan:  Ovarian cyst- I have offered watchful waiting versus L/S removal of left ovary GSUI- I have offered watchful waiting versus mid urethral sling. I have discussed risks of surgery including a 5% risk of causing urge incontinence.

## 2012-01-12 ENCOUNTER — Telehealth: Payer: Self-pay | Admitting: *Deleted

## 2012-01-12 NOTE — Telephone Encounter (Signed)
Patient has decided that she would like to have the surgery to have the ovarian mass removed but not the bladder repair.  She would like to have the procedure done with Dr. Macon Large at the end of November if at all possible.  Just let me know if this is ok and I will call her back and let her know what the plan is.

## 2012-01-15 NOTE — Telephone Encounter (Signed)
Patient was notified of information and made a pre op appointment for the 19th of November.

## 2012-01-15 NOTE — Telephone Encounter (Signed)
Patient will be scheduled for laparoscopic left salpingoophorectomy; surgical schedule will contact her with time and date.  However, she also needs to come in for a preoperative visit with me.

## 2012-01-20 ENCOUNTER — Encounter (HOSPITAL_COMMUNITY): Payer: Self-pay | Admitting: Pharmacist

## 2012-01-25 ENCOUNTER — Encounter: Payer: Self-pay | Admitting: Obstetrics & Gynecology

## 2012-01-25 ENCOUNTER — Ambulatory Visit (INDEPENDENT_AMBULATORY_CARE_PROVIDER_SITE_OTHER): Payer: BC Managed Care – PPO | Admitting: Obstetrics & Gynecology

## 2012-01-25 VITALS — BP 135/81 | HR 79 | Ht 65.0 in | Wt 182.0 lb

## 2012-01-25 DIAGNOSIS — N83209 Unspecified ovarian cyst, unspecified side: Secondary | ICD-10-CM

## 2012-01-25 NOTE — Progress Notes (Signed)
History:  51 y.o. G2P2 here today for discussion of surgery for large ovarian cyst. Pelvic ultrasound and MRI showed 7 cm left ovarian cyst.  No septations or nodularity noted. Normal CA-125. Patient desires surgical management and has been scheduled for laparoscopic LSO on 02/28/12. She denies any symptoms.  The following portions of the patient's history were reviewed and updated as appropriate: allergies, current medications, past family history, past medical history, past social history, past surgical history and problem list.  Review of Systems:  Pertinent items are noted in HPI.  Objective:  Physical Exam Blood pressure 135/81, pulse 79, height 5\' 5"  (1.651 m), weight 182 lb (82.555 kg). Gen: NAD Abd: Soft, nontender and nondistended Pelvic: Deferred  Assessment & Plan:  Patient desires surgical management with laparoscopic LSO.  The risks of surgery were discussed in detail with the patient including but not limited to: bleeding which may require transfusion or reoperation; infection which may require prolonged hospitalization or re-hospitalization and antibiotic therapy; injury to bowel, bladder, ureters and major vessels or other surrounding organs; need for additional procedures including laparotomy; thromboembolic phenomenon, incisional problems and other postoperative or anesthesia complications.  Patient was told that the likelihood that her condition and symptoms will be treated effectively with this surgical management was very high; the postoperative expectations were also discussed in detail. The patient also understands the alternative treatment options which were discussed in full.  All questions were answered.  Routine preoperative instructions of having nothing to eat or drink after midnight on the day prior to surgery and also coming to the hospital 1 1/2 hours prior to her time of surgery were also emphasized.  She was told she may be called for a preoperative appointment about  a week prior to surgery and will be given further preoperative instructions at that visit. Printed patient education handouts about the procedure were given to the patient to review at home.

## 2012-01-25 NOTE — Patient Instructions (Signed)
Laparoscopy Laparoscopy is a surgical procedure. It is used to diagnose and treat diseases inside the belly(abdomen). It is usually a brief, common, and relatively simple procedure. The laparoscopeis a thin, lighted, pencil-sized instrument. It is like a telescope. It is inserted into your abdomen through a small cut (incision). Your caregiver can look at the organs inside your body through this instrument. He or she can see if there is anything abnormal. Laparoscopy can be done either in a hospital or outpatient clinic. You may be given a mild sedative to help you relax before the procedure. Once in the operating room, you will be given a drug to make you sleep (general anesthesia). Laparoscopy usually lasts less than 1 hour. After the procedure, you will be monitored in a recovery area until you are stable and doing well. Once you are home, it will take 2 to 3 days to fully recover. RISKS AND COMPLICATIONS  Laparoscopy has relatively few risks. Your caregiver will discuss the risks with you before the procedure. Some problems that can occur include:  Infection.  Bleeding.  Damage to other organs.  Anesthetic side effects. PROCEDURE Once you receive anesthesia, your surgeon inflates the abdomen with a harmless gas (carbon dioxide). This makes the organs easier to see. The laparoscope is inserted into the abdomen through a small incision. This allows your surgeon to see into the abdomen. Other small instruments are also inserted into the abdomen through other small openings. Many surgeons attach a video camera to the laparoscope to enlarge the view. During a diagnostic laparoscopy, the surgeon may be looking for inflammation, infection, or cancer. Your surgeon may take tissue samples(biopsies). The samples are sent to a specialist in looking at cells and tissue samples (pathologist). The pathologist examines them under a microscope. Biopsies can help to diagnose or confirm a disease. AFTER THE  PROCEDURE   The gas is released from inside the abdomen.  The incisions are closed with stitches (sutures). Because these incisions are small (usually less than 1/2 inch), there is usually minimal discomfort after the procedure. There may be some mild discomfort in the throat. This is from the tube placed in the throat while you were sleeping. You may have some mild abdominal discomfort. There may also be discomfort from the instrument placement incisions in the abdomen.  The recovery time is shortened as long as there are no complications.  You will rest in a recovery room until stable and doing well. As long as there are no complications, you may be allowed to go home. FINDING OUT THE RESULTS OF YOUR TEST Not all test results are available during your visit. If your test results are not back during the visit, make an appointment with your caregiver to find out the results. Do not assume everything is normal if you have not heard from your caregiver or the medical facility. It is important for you to follow up on all of your test results. HOME CARE INSTRUCTIONS   Take all medicines as directed.  Only take over-the-counter or prescription medicines for pain, discomfort, or fever as directed by your caregiver.  Resume daily activities as directed.  Showers are preferred over baths.  You may resume sexual activities in 1 week or as directed.  Do not drive while taking narcotics. SEEK MEDICAL CARE IF:   There is increasing abdominal pain.  There is new pain in the shoulders (shoulder strap areas).  You feel lightheaded or faint.  You have the chills.  You or your child  has an oral temperature above 102 F (38.9 C).  There is pus-like (purulent) drainage from any of the wounds.  You are unable to pass gas or have a bowel movement.  You feel sick to your stomach (nauseous) or throw up (vomit). MAKE SURE YOU:   Understand these instructions.  Will watch your condition.  Will  get help right away if you are not doing well or get worse. Document Released: 06/05/2000 Document Revised: 05/22/2011 Document Reviewed: 02/27/2007 [facility] Patient Information 2013 [place], [place].  Unilateral Salpingo-Oophorectomy Unilateral salpingo-oophorectomy is the removal of one fallopian tube and ovary. The fallopian tubes transport the egg from the ovary to the womb (uterus). The fallopian tube is also where the sperm and egg meet and become fertilized and move down into the uterus.  Removing one tube and ovary will not:  Cause problems with your menstrual periods.  Cause problems with your sex drive (libido).  Put you into the menopause.  Give symptoms of the menopause.  Make you not able to get pregnant (sterile). There are several reasons for doing a salpingo-oophorectomy:  Infection of the tube and ovary.  There may be scar tissue of the tube and ovary (adhesions).  It may be necessary to remove the tube when the ovary has a cyst or tumor.  It may have to be done when removing the uterus.  It may have to be done when there is cancer of the tube or ovary. LET YOUR CAREGIVER KNOW ABOUT:  Allergies to food or medications.  All the medications you are taking including prescription and over-the-counter herbs, eye drops and creams.  If you are using illegal drugs or excessive alcohol.  Your smoking habits.  Previous problems with anesthesia including numbing medication.  The possibility of being pregnant.  History of blood clots or bleeding problems.  Previous surgery.  Any other medical or health problems. RISKS AND COMPLICATIONS  All surgery is associated with risks. Some of these risks are:  Injury to surrounding organs.  Bleeding.  Infection.  Blood clots in the legs or lungs.  Problems with the anesthesia.  The surgery does not help the problem.  Death. BEFORE THE PROCEDURE  Do not take aspirin or blood thinners because it can make you  bleed.  Do not eat or drink anything at least 8 hours before the surgery.  Let your caregiver know if you develop a cold or an infection.  If you are being admitted the day of surgery, arrive at least one hour before the surgery.  Arrange for help when you go home from the hospital.  If you smoke, do not smoke for at least 2 weeks before the surgery. PROCEDURE After being admitted to the hospital, you will change into a hospital gown. Then, you will be given an IV (intravenous) and a medication to relax you. Then, you will be put to sleep with an anesthetic. Any hair on your lower belly (abdomen) will be removed, and a catheter will be placed in your bladder. The fallopian tube and ovary will be removed either through 2 very small cuts (incisions) or through large incision in the lower abdomen. The blood vessels will be clamped and tied. AFTER THE PROCEDURE  You will be taken to the recovery room for 1 to 3 hours until your blood pressure, pulse and temperature are stable and you are waking up.  If you had a laparoscopy, you may be discharged in several hours.  If you had a large incision, you will  be admitted to the hospital for a day or two.  If you had a laparoscopy, you may have shoulder pain. This is not unusual. It is from air that is left in the abdomen and affects the nerve that goes from the diaphragm to the shoulder. It goes away in a day or two.  You will be given pain medication as necessary.  The intravenous and catheter will be removed before you are discharged.  Have someone available to take you home. HOME CARE INSTRUCTIONS   It is normal to be sore for a week or two. Call your caregiver if the pain is getting worse or the pain medication is not helping.  Have help when you go home for a week or so to help with the household chores.  Follow your caregiver's advice regarding diet.  Get rest and sleep.  Only take over-the-counter or prescription medicines for pain  or discomfort as directed by your caregiver.  Do not take aspirin. It can cause bleeding.  Do not drive, exercise or lift anything over 5 pounds.  Do not drink alcohol until your caregiver gives you permission.  Do not lift anything over 5 pounds.  Do not have sexual intercourse until your caregiver says it is OK.  Take your temperature twice a day and write it down.  Change the bandage (dressing) as directed.  Make and keep your follow-up appointments for postoperative care.  If you become constipated, ask your caregiver about taking a mild laxative. Drinking more liquids than usual and eating bran foods can help prevent constipation. SEEK MEDICAL CARE IF:   You have swelling or redness around the cut (incision).  You develop a rash.  You have side effects from the medication.  You feel lightheaded.  You need more or stronger medication.  You have pain, swelling or redness where the IV (intravenous) was placed. SEEK IMMEDIATE MEDICAL CARE IF:   You develop an unexplained temperature above 100 F (37.8 C).  You develop increasing belly (abdominal) pain.  You have pus coming out of the incision.  You notice a bad smell coming from the wound or dressing.  The incision is separating.  There is excessive vaginal bleeding.  You start to feel sick to your stomach (nauseous) and vomit.  You have leg or chest pain.  You have pain when you urinate.  You develop shortness of breath.  You pass out. Document Released: 12/25/2008 Document Revised: 05/22/2011 Document Reviewed: 12/25/2008 [facility] Patient Information 2013 [place], [place].

## 2012-01-30 ENCOUNTER — Ambulatory Visit: Payer: BC Managed Care – PPO | Admitting: Obstetrics & Gynecology

## 2012-02-20 ENCOUNTER — Ambulatory Visit (INDEPENDENT_AMBULATORY_CARE_PROVIDER_SITE_OTHER): Payer: BC Managed Care – PPO | Admitting: Obstetrics & Gynecology

## 2012-02-20 ENCOUNTER — Encounter: Payer: Self-pay | Admitting: Obstetrics & Gynecology

## 2012-02-20 VITALS — BP 140/90 | HR 72 | Ht 65.0 in | Wt 181.0 lb

## 2012-02-20 DIAGNOSIS — N83209 Unspecified ovarian cyst, unspecified side: Secondary | ICD-10-CM

## 2012-02-20 NOTE — Patient Instructions (Signed)
Return to clinic for any scheduled appointments or for any gynecologic concerns as needed.   

## 2012-02-20 NOTE — Progress Notes (Signed)
History:   51 y.o. G2P2 here today for followup; has been scheduled for laparoscopic LSO on 02/28/12 for large ovarian cyst. Pelvic ultrasound and MRI showed 7 cm left ovarian cyst. No septations or nodularity noted. Normal CA-125. She denies any symptoms.   Past Medical History  Diagnosis Date  . Abnormal Pap smear   . Diarrhea   . Fibroids   . Kidney infection   . Arthritis     right knee   Past Surgical History  Procedure Date  . Knee surgery   . Appendectomy   . Colposcopy 03/01/10  . Colonoscopy 10/10/2010   The following portions of the patient's history were reviewed and updated as appropriate: allergies, current medications, past family history, past medical history, past social history, past surgical history and problem list.   Review of Systems:   Pertinent items are noted in HPI.   Objective:   Physical Exam  Blood pressure 140/90, pulse 72, height 5\' 5"  (1.651 m), weight 181 lb (82.101 kg). Gen: NAD Lungs: CTAB Heart: RRR  Abd: Soft, nontender and nondistended  Pelvic: Deferred   Assessment & Plan:   Patient is scheduled for laparoscopic LSO on 02/28/12. The risks of surgery were re-discussed in detail with the patient including but not limited to: bleeding which may require transfusion or reoperation; infection which may require prolonged hospitalization or re-hospitalization and antibiotic therapy; injury to bowel, bladder, ureters and major vessels or other surrounding organs; need for additional procedures including laparotomy; thromboembolic phenomenon, incisional problems and other postoperative or anesthesia complications. Patient was told that the likelihood that her condition and symptoms will be treated effectively with this surgical management was very high; the postoperative expectations were also discussed in detail. The patient also understands the alternative treatment options which were discussed in full. All questions were answered. Routine preoperative  instructions of having nothing to eat or drink after midnight on the day prior to surgery and also coming to the hospital 1 1/2 hours prior to her time of surgery were also emphasized. She has a preoperative appointment soon, other preoperative instructions will be given at that visit.  Will see her again on the day of surgery and address any further concerns prior to surgery.

## 2012-02-22 ENCOUNTER — Encounter (HOSPITAL_COMMUNITY): Payer: Self-pay

## 2012-02-22 ENCOUNTER — Encounter (HOSPITAL_COMMUNITY)
Admission: RE | Admit: 2012-02-22 | Discharge: 2012-02-22 | Disposition: A | Discharge status: Home / Self Care | Payer: BC Managed Care – PPO | Source: Ambulatory Visit | Attending: Obstetrics & Gynecology | Admitting: Obstetrics & Gynecology

## 2012-02-22 HISTORY — DX: Nausea with vomiting, unspecified: R11.2

## 2012-02-22 HISTORY — DX: Other specified postprocedural states: Z98.890

## 2012-02-22 LAB — CBC
HCT: 41.9 % (ref 36.0–46.0)
Hemoglobin: 13.6 g/dL (ref 12.0–15.0)
MCH: 29 pg (ref 26.0–34.0)
MCHC: 32.5 g/dL (ref 30.0–36.0)
MCV: 89.3 fL (ref 78.0–100.0)
Platelets: 331 10*3/uL (ref 150–400)
RBC: 4.69 MIL/uL (ref 3.87–5.11)
RDW: 13.5 % (ref 11.5–15.5)
WBC: 9.8 10*3/uL (ref 4.0–10.5)

## 2012-02-22 LAB — SURGICAL PCR SCREEN
MRSA, PCR: NEGATIVE
Staphylococcus aureus: POSITIVE — AB

## 2012-02-22 NOTE — Patient Instructions (Addendum)
Your procedure is scheduled on:02/28/12   Enter through the Main Entrance at :12 noon Pick up desk phone and dial [phone] and inform us of your arrival.  Please call [phone] if you have any problems the morning of surgery.  Remember: Do not eat after midnight:TUESDAY Clear liquids ok until 9am on WEDNESDAY  Take these meds the morning of surgery with a sip of water:NONE  DO NOT wear jewelry, eye make-up, lipstick,body lotion, or dark fingernail polish. Do not shave for 48 hours prior to surgery.  Patients discharged on the day of surgery will not be allowed to drive home.

## 2012-02-27 MED ORDER — CEFOTETAN DISODIUM 2 G IJ SOLR
2.0000 g | INTRAMUSCULAR | Status: AC
  Administered 2012-02-28: 2 g via INTRAVENOUS
  Filled 2012-02-27: qty 2

## 2012-02-27 MED ORDER — DEXTROSE 5 % IV SOLN
2.0000 g | INTRAVENOUS | Status: DC
  Filled 2012-02-27: qty 2

## 2012-02-28 ENCOUNTER — Encounter (HOSPITAL_COMMUNITY)
Admission: RE | Disposition: A | Discharge status: Home / Self Care | Payer: Self-pay | Source: Ambulatory Visit | Attending: Obstetrics & Gynecology

## 2012-02-28 ENCOUNTER — Ambulatory Visit (HOSPITAL_COMMUNITY)
Admission: RE | Admit: 2012-02-28 | Discharge: 2012-02-28 | Disposition: A | Discharge status: Home / Self Care | Payer: BC Managed Care – PPO | Source: Ambulatory Visit | Attending: Obstetrics & Gynecology | Admitting: Obstetrics & Gynecology

## 2012-02-28 ENCOUNTER — Ambulatory Visit (HOSPITAL_COMMUNITY): Payer: BC Managed Care – PPO | Admitting: Anesthesiology

## 2012-02-28 ENCOUNTER — Encounter (HOSPITAL_COMMUNITY): Payer: Self-pay | Admitting: Anesthesiology

## 2012-02-28 ENCOUNTER — Encounter (HOSPITAL_COMMUNITY): Payer: Self-pay | Admitting: Obstetrics & Gynecology

## 2012-02-28 DIAGNOSIS — D279 Benign neoplasm of unspecified ovary: Secondary | ICD-10-CM | POA: Diagnosis present

## 2012-02-28 DIAGNOSIS — Z01818 Encounter for other preprocedural examination: Secondary | ICD-10-CM

## 2012-02-28 DIAGNOSIS — N83209 Unspecified ovarian cyst, unspecified side: Secondary | ICD-10-CM

## 2012-02-28 DIAGNOSIS — D252 Subserosal leiomyoma of uterus: Secondary | ICD-10-CM

## 2012-02-28 DIAGNOSIS — Z01812 Encounter for preprocedural laboratory examination: Secondary | ICD-10-CM

## 2012-02-28 HISTORY — PX: LAPAROSCOPY: SHX197

## 2012-02-28 HISTORY — PX: SALPINGOOPHORECTOMY: SHX82

## 2012-02-28 HISTORY — PX: LAPAROSCOPIC LYSIS OF ADHESIONS: SHX5905

## 2012-02-28 LAB — PREGNANCY, URINE: Preg Test, Ur: NEGATIVE

## 2012-02-28 SURGERY — LAPAROSCOPY OPERATIVE
Anesthesia: General | Wound class: Clean Contaminated

## 2012-02-28 MED ORDER — LIDOCAINE HCL (CARDIAC) 20 MG/ML IV SOLN
INTRAVENOUS | Status: DC | PRN
  Administered 2012-02-28: 50 mg via INTRAVENOUS

## 2012-02-28 MED ORDER — ROCURONIUM BROMIDE 50 MG/5ML IV SOLN
INTRAVENOUS | Status: AC
  Filled 2012-02-28: qty 1

## 2012-02-28 MED ORDER — MEPERIDINE HCL 25 MG/ML IJ SOLN: 6.2500 mg | INTRAMUSCULAR | Status: DC | PRN

## 2012-02-28 MED ORDER — ONDANSETRON HCL 4 MG/2ML IJ SOLN
INTRAMUSCULAR | Status: AC
  Filled 2012-02-28: qty 2

## 2012-02-28 MED ORDER — DOCUSATE SODIUM 100 MG PO CAPS: 100.0000 mg | ORAL_CAPSULE | Freq: Two times a day (BID) | ORAL | Status: DC | PRN

## 2012-02-28 MED ORDER — DEXAMETHASONE SODIUM PHOSPHATE 4 MG/ML IJ SOLN
INTRAMUSCULAR | Status: DC | PRN
  Administered 2012-02-28: 10 mg via INTRAVENOUS

## 2012-02-28 MED ORDER — IBUPROFEN 600 MG PO TABS: 600.0000 mg | ORAL_TABLET | Freq: Four times a day (QID) | ORAL | Status: DC | PRN

## 2012-02-28 MED ORDER — ROCURONIUM BROMIDE 100 MG/10ML IV SOLN
INTRAVENOUS | Status: DC | PRN
  Administered 2012-02-28 (×2): 50 mg via INTRAVENOUS

## 2012-02-28 MED ORDER — FENTANYL CITRATE 0.05 MG/ML IJ SOLN
25.0000 ug | INTRAMUSCULAR | Status: DC | PRN
  Administered 2012-02-28 (×2): 50 ug via INTRAVENOUS

## 2012-02-28 MED ORDER — LACTATED RINGERS IV SOLN: INTRAVENOUS | Status: DC

## 2012-02-28 MED ORDER — GLYCOPYRROLATE 0.2 MG/ML IJ SOLN
INTRAMUSCULAR | Status: DC | PRN
  Administered 2012-02-28: 0.4 mg via INTRAVENOUS

## 2012-02-28 MED ORDER — NEOSTIGMINE METHYLSULFATE 1 MG/ML IJ SOLN
INTRAMUSCULAR | Status: AC
  Filled 2012-02-28: qty 1

## 2012-02-28 MED ORDER — FENTANYL CITRATE 0.05 MG/ML IJ SOLN
INTRAMUSCULAR | Status: AC
  Filled 2012-02-28: qty 2

## 2012-02-28 MED ORDER — BUPIVACAINE HCL (PF) 0.5 % IJ SOLN
INTRAMUSCULAR | Status: AC
  Filled 2012-02-28: qty 30

## 2012-02-28 MED ORDER — ONDANSETRON HCL 4 MG/2ML IJ SOLN
INTRAMUSCULAR | Status: DC | PRN
  Administered 2012-02-28: 4 mg via INTRAVENOUS

## 2012-02-28 MED ORDER — DEXAMETHASONE SODIUM PHOSPHATE 10 MG/ML IJ SOLN
INTRAMUSCULAR | Status: AC
  Filled 2012-02-28: qty 1

## 2012-02-28 MED ORDER — NEOSTIGMINE METHYLSULFATE 1 MG/ML IJ SOLN
INTRAMUSCULAR | Status: DC | PRN
  Administered 2012-02-28: 3 mg via INTRAVENOUS

## 2012-02-28 MED ORDER — LACTATED RINGERS IR SOLN
Status: DC | PRN
  Administered 2012-02-28: 3000 mL

## 2012-02-28 MED ORDER — KETOROLAC TROMETHAMINE 30 MG/ML IJ SOLN
INTRAMUSCULAR | Status: DC | PRN
  Administered 2012-02-28: 30 mg via INTRAVENOUS

## 2012-02-28 MED ORDER — OXYCODONE-ACETAMINOPHEN 5-325 MG PO TABS: 1.0000 | ORAL_TABLET | Freq: Four times a day (QID) | ORAL | Status: DC | PRN

## 2012-02-28 MED ORDER — PROPOFOL 10 MG/ML IV EMUL
INTRAVENOUS | Status: AC
  Filled 2012-02-28: qty 20

## 2012-02-28 MED ORDER — ONDANSETRON HCL 4 MG/2ML IJ SOLN: 4.0000 mg | Freq: Once | INTRAMUSCULAR | Status: DC | PRN

## 2012-02-28 MED ORDER — BUPIVACAINE HCL (PF) 0.5 % IJ SOLN
INTRAMUSCULAR | Status: DC | PRN
  Administered 2012-02-28: 10 mL

## 2012-02-28 MED ORDER — PROPOFOL 10 MG/ML IV EMUL
INTRAVENOUS | Status: DC | PRN
  Administered 2012-02-28: 150 mg via INTRAVENOUS

## 2012-02-28 MED ORDER — KETOROLAC TROMETHAMINE 30 MG/ML IJ SOLN
INTRAMUSCULAR | Status: AC
  Filled 2012-02-28: qty 1

## 2012-02-28 MED ORDER — KETOROLAC TROMETHAMINE 30 MG/ML IJ SOLN
15.0000 mg | Freq: Once | INTRAMUSCULAR | Status: DC | PRN
Start: 2012-02-28 — End: 2012-02-28

## 2012-02-28 MED ORDER — SCOPOLAMINE 1 MG/3DAYS TD PT72
1.0000 | MEDICATED_PATCH | TRANSDERMAL | Status: DC
  Administered 2012-02-28: 1.5 mg via TRANSDERMAL

## 2012-02-28 MED ORDER — GLYCOPYRROLATE 0.2 MG/ML IJ SOLN
INTRAMUSCULAR | Status: AC
  Filled 2012-02-28: qty 2

## 2012-02-28 MED ORDER — FENTANYL CITRATE 0.05 MG/ML IJ SOLN
INTRAMUSCULAR | Status: DC | PRN
  Administered 2012-02-28: 50 ug via INTRAVENOUS
  Administered 2012-02-28: 100 ug via INTRAVENOUS
  Administered 2012-02-28: 50 ug via INTRAVENOUS

## 2012-02-28 MED ORDER — MIDAZOLAM HCL 2 MG/2ML IJ SOLN
INTRAMUSCULAR | Status: AC
  Filled 2012-02-28: qty 2

## 2012-02-28 MED ORDER — LIDOCAINE HCL (CARDIAC) 20 MG/ML IV SOLN
INTRAVENOUS | Status: AC
  Filled 2012-02-28: qty 5

## 2012-02-28 MED ORDER — LACTATED RINGERS IV SOLN
INTRAVENOUS | Status: DC
Start: 2012-02-28 — End: 2012-02-28
  Administered 2012-02-28 (×2): via INTRAVENOUS

## 2012-02-28 MED ORDER — MIDAZOLAM HCL 5 MG/5ML IJ SOLN
INTRAMUSCULAR | Status: DC | PRN
  Administered 2012-02-28: 2 mg via INTRAVENOUS

## 2012-02-28 MED ORDER — SCOPOLAMINE 1 MG/3DAYS TD PT72
MEDICATED_PATCH | TRANSDERMAL | Status: AC
  Administered 2012-02-28: 1.5 mg via TRANSDERMAL
  Filled 2012-02-28: qty 1

## 2012-02-28 SURGICAL SUPPLY — 31 items
CABLE HIGH FREQUENCY MONO STRZ (ELECTRODE) IMPLANT
CHLORAPREP W/TINT 26ML (MISCELLANEOUS) ×3 IMPLANT
CLOTH BEACON ORANGE TIMEOUT ST (SAFETY) ×3 IMPLANT
FORCEPS CUTTING 33CM 5MM (CUTTING FORCEPS) IMPLANT
GLOVE BIO SURGEON STRL SZ7 (GLOVE) ×3 IMPLANT
GLOVE BIOGEL PI IND STRL 7.0 (GLOVE) ×4 IMPLANT
GLOVE BIOGEL PI INDICATOR 7.0 (GLOVE) ×2
GOWN PREVENTION PLUS LG XLONG (DISPOSABLE) ×3 IMPLANT
GOWN STRL REIN XL XLG (GOWN DISPOSABLE) ×3 IMPLANT
NEEDLE GYRUS 33CM (NEEDLE) IMPLANT
NEEDLE INSUFFLATION 14GA 120MM (NEEDLE) ×3 IMPLANT
NEEDLE SPNL 18GX3.5 QUINCKE PK (NEEDLE) ×3 IMPLANT
NS IRRIG 1000ML POUR BTL (IV SOLUTION) ×3 IMPLANT
PACK LAPAROSCOPY BASIN (CUSTOM PROCEDURE TRAY) ×3 IMPLANT
POUCH ENDO CATCH II 15MM (MISCELLANEOUS) ×3 IMPLANT
POUCH SPECIMEN RETRIEVAL 10MM (ENDOMECHANICALS) IMPLANT
PROTECTOR NERVE ULNAR (MISCELLANEOUS) ×3 IMPLANT
SEALER TISSUE G2 CVD JAW 35 (ENDOMECHANICALS) ×2 IMPLANT
SEALER TISSUE G2 CVD JAW 45CM (ENDOMECHANICALS) ×1
SET IRRIG TUBING LAPAROSCOPIC (IRRIGATION / IRRIGATOR) ×3 IMPLANT
SUT VIC AB 3-0 X1 27 (SUTURE) IMPLANT
SUT VICRYL 0 UR6 27IN ABS (SUTURE) ×6 IMPLANT
SUT VICRYL 4-0 PS2 18IN ABS (SUTURE) ×3 IMPLANT
SYR 50ML LL SCALE MARK (SYRINGE) ×3 IMPLANT
TOWEL OR 17X24 6PK STRL BLUE (TOWEL DISPOSABLE) ×6 IMPLANT
TRAY FOLEY CATH 14FR (SET/KITS/TRAYS/PACK) ×3 IMPLANT
TROCAR BLADELESS 15MM (ENDOMECHANICALS) ×3 IMPLANT
TROCAR XCEL NON-BLD 11X100MML (ENDOMECHANICALS) ×3 IMPLANT
TROCAR XCEL NON-BLD 5MMX100MML (ENDOMECHANICALS) ×3 IMPLANT
WARMER LAPAROSCOPE (MISCELLANEOUS) ×3 IMPLANT
WATER STERILE IRR 1000ML POUR (IV SOLUTION) ×3 IMPLANT

## 2012-02-28 NOTE — Anesthesia Postprocedure Evaluation (Signed)
  Anesthesia Post-op Note  Patient: Danielle Duarte  Procedure(s) Performed: Procedure(s) (LRB) with comments: LAPAROSCOPY OPERATIVE (N/A) SALPINGO OOPHORECTOMY (Left) LAPAROSCOPIC LYSIS OF ADHESIONS (N/A)  Patient Location: PACU  Anesthesia Type:General  Level of Consciousness: awake, alert  and oriented  Airway and Oxygen Therapy: Patient Spontanous Breathing  Post-op Pain: mild  Post-op Assessment: Post-op Vital signs reviewed, Patient's Cardiovascular Status Stable, Respiratory Function Stable, Patent Airway, No signs of Nausea or vomiting and Pain level controlled  Post-op Vital Signs: Reviewed and stable  Complications: No apparent anesthesia complications

## 2012-02-28 NOTE — H&P (View-Only) (Signed)
History:   51 y.o. G2P2 here today for followup; has been scheduled for laparoscopic LSO on 02/28/12 for large ovarian cyst. Pelvic ultrasound and MRI showed 7 cm left ovarian cyst. No septations or nodularity noted. Normal CA-125. She denies any symptoms.   Past Medical History  Diagnosis Date  . Abnormal Pap smear   . Diarrhea   . Fibroids   . Kidney infection   . Arthritis     right knee   Past Surgical History  Procedure Date  . Knee surgery   . Appendectomy   . Colposcopy 03/01/10  . Colonoscopy 10/10/2010   The following portions of the patient's history were reviewed and updated as appropriate: allergies, current medications, past family history, past medical history, past social history, past surgical history and problem list.   Review of Systems:   Pertinent items are noted in HPI.   Objective:   Physical Exam  Blood pressure 140/90, pulse 72, height 5' 5" (1.651 m), weight 181 lb (82.101 kg). Gen: NAD Lungs: CTAB Heart: RRR  Abd: Soft, nontender and nondistended  Pelvic: Deferred   Assessment & Plan:   Patient is scheduled for laparoscopic LSO on 02/28/12. The risks of surgery were re-discussed in detail with the patient including but not limited to: bleeding which may require transfusion or reoperation; infection which may require prolonged hospitalization or re-hospitalization and antibiotic therapy; injury to bowel, bladder, ureters and major vessels or other surrounding organs; need for additional procedures including laparotomy; thromboembolic phenomenon, incisional problems and other postoperative or anesthesia complications. Patient was told that the likelihood that her condition and symptoms will be treated effectively with this surgical management was very high; the postoperative expectations were also discussed in detail. The patient also understands the alternative treatment options which were discussed in full. All questions were answered. Routine preoperative  instructions of having nothing to eat or drink after midnight on the day prior to surgery and also coming to the hospital 1 1/2 hours prior to her time of surgery were also emphasized. She has a preoperative appointment soon, other preoperative instructions will be given at that visit.  Will see her again on the day of surgery and address any further concerns prior to surgery.  

## 2012-02-28 NOTE — Anesthesia Procedure Notes (Signed)
Procedure Name: Intubation Date/Time: 02/28/2012 1:38 PM Performed by: Shanon Payor Pre-anesthesia Checklist: Patient identified, Emergency Drugs available, Suction available, Patient being monitored and Timeout performed Patient Re-evaluated:Patient Re-evaluated prior to inductionOxygen Delivery Method: Circle system utilized Preoxygenation: Pre-oxygenation with 100% oxygen Intubation Type: Combination inhalational/ intravenous induction Ventilation: Mask ventilation without difficulty Laryngoscope Size: Miller and 3 Grade View: Grade III Tube type: Oral Tube size: 7.0 mm Number of attempts: 3 Airway Equipment and Method: Stylet and Video-laryngoscopy Placement Confirmation: ETT inserted through vocal cords under direct vision,  positive ETCO2 and breath sounds checked- equal and bilateral Secured at: 22 cm Tube secured with: Tape Dental Injury: Teeth and Oropharynx as per pre-operative assessment  Difficulty Due To: Difficult Airway- due to anterior larynx Comments: DL by CRNA, esophogeal intubation, removed tube. DL by Anesthesiologist with esophogeal intubation, removed tube. DL with glidescope by CRNA, endotracheal intubation achieved and verified with etCO2, bilaterally equal breath sounds. OG tube placed and gastric contents evacuated. No trauma to airway noted.

## 2012-02-28 NOTE — Op Note (Signed)
Danielle Duarte PROCEDURE DATE: 02/28/2012  PREOPERATIVE DIAGNOSIS: Symptomatic left ovarian cyst POSTOPERATIVE DIAGNOSIS: The same PROCEDURE: Laparoscopic left salpingo-oophorectomy, lysis of adhesions SURGEON:  Dr. Jaynie Collins ASSISTANT: Dr. Elsie Lincoln  INDICATIONS: 51 y.o. G2P2 here for definitive surgical management of large left ovarian cyst.  Risks of surgery were discussed with the patient including but not limited to: bleeding which may require transfusion or reoperation; infection which may require antibiotics; injury to bowel, bladder, ureters or other surrounding organs; need for additional procedures; thromboembolic phenomenon, incisional problems and other postoperative/anesthesia complications. Written informed consent was obtained.    FINDINGS:  8 cm enlarged left ovary containing the large cyst.  Adhesions of the omentum to anterior abdominal wall.  Other adhesions noted in the posterior cul-de-sac.  Adhesions lysed. Normal uterus with small right fundal subserosal fibroid noted, normal right ovary and fallopian tube.  ANESTHESIA:    General INTRAVENOUS FLUIDS:1400  ml ESTIMATED BLOOD LOSS:10 ml URINE OUTPUT: 100 ml SPECIMENS: Left ovary and fallopian tube; peritoneal washings COMPLICATIONS: None immediate  PROCEDURE IN DETAIL:  The patient received intravenous antibiotics and had sequential compression devices applied to her lower extremities while in the preoperative area.  She was then taken to the operating room where general anesthesia was administered and was found to be adequate.  She was placed in the dorsal lithotomy position, and was prepped and draped in a sterile manner.  A Foley catheter was inserted into her bladder and attached to constant drainage and a uterine manipulator was then advanced into the uterus .  After an adequate timeout was performed, attention was then turned to the patient's abdomen where a 11-mm skin incision was made in the umbilical fold.   The Veress needle was carefully introduced into the peritoneal cavity through the abdominal wall.  Intraperitoneal placement was confirmed by drop in intraabdominal pressure with insufflation of carbon dioxide gas.  Adequate pneumoperitoneum was obtained, and the 11-mm trocar and sleeve were then advanced without difficulty into the abdomen where intraabdominal placement was confirmed by the laparoscope. A survey of the patient's pelvis and abdomen revealed the findings above.   Bilateral lower quadrant ports were then placed under direct visualization ; 5-mm on the left and 15-mm on the right.  Peritoneal washings were obtained.  On the left side, the uteroovarian ligament was then clamped and transected with the Enseal device.  The left infundibulopelvic ligament was also clamped and transected allowing for salpingooophorectomy.  Excellent hemostasis was noted. The specimen was then placed in a 15-mm Endocatch bag through the 15-mm port, and the cyst was aspirated for over 180 ml of clear, serous fluid before the specimen was able to be removed from the abdomen under direct visualization.  The operative site was surveyed, and it was found to be hemostatic. The adhesions that were visualized were lysed using the Enseal device.   No intraoperative injury to other surrounding organs was noted.  The 15-mm fascial incision was closed using the laparoscopic fascial closure device.  The abdomen was desufflated and all instruments were then removed from the patient's abdomen.  The fascial incision of the umbilicus was closed with a 0 Vicryl figure of eight stitch.  All skin incisions were closed with 4-0 Vicryl subcuticular stitches and Dermabond.   The patient will be discharged to home as per PACU criteria.  Routine postoperative instructions given.  She was prescribed Percocet, Ibuprofen and Colace.  She will follow up in the clinic on 03/28/12 for postoperative evaluation .

## 2012-02-28 NOTE — Transfer of Care (Signed)
Immediate Anesthesia Transfer of Care Note  Patient: Danise A Yaney  Procedure(s) Performed: Procedure(s) (LRB) with comments: LAPAROSCOPY OPERATIVE (N/A) SALPINGO OOPHORECTOMY (Left) LAPAROSCOPIC LYSIS OF ADHESIONS (N/A)  Patient Location: PACU  Anesthesia Type:General  Level of Consciousness: awake, alert  and oriented  Airway & Oxygen Therapy: Patient Spontanous Breathing and Patient connected to nasal cannula oxygen  Post-op Assessment: Report given to PACU RN and Post -op Vital signs reviewed and stable  Post vital signs: Reviewed and stable  Complications: No apparent anesthesia complications

## 2012-02-28 NOTE — Anesthesia Preprocedure Evaluation (Signed)
Anesthesia Evaluation  Patient identified by MRN, date of birth, ID band Patient awake    Reviewed: Allergy & Precautions, H&P , NPO status , Patient's Chart, lab work & pertinent test results  Airway Mallampati: II TM Distance: >3 FB Neck ROM: full    Dental No notable dental hx. (+) Teeth Intact   Pulmonary neg pulmonary ROS,          Cardiovascular negative cardio ROS      Neuro/Psych negative neurological ROS  negative psych ROS   GI/Hepatic negative GI ROS, Neg liver ROS,   Endo/Other  negative endocrine ROS  Renal/GU negative Renal ROS  negative genitourinary   Musculoskeletal negative musculoskeletal ROS (+)   Abdominal Normal abdominal exam  (+)   Peds negative pediatric ROS (+)  Hematology negative hematology ROS (+)   Anesthesia Other Findings   Reproductive/Obstetrics negative OB ROS                           Anesthesia Physical Anesthesia Plan  ASA: II  Anesthesia Plan: General   Post-op Pain Management:    Induction: Intravenous  Airway Management Planned: Oral ETT  Additional Equipment:   Intra-op Plan:   Post-operative Plan: Extubation in OR  Informed Consent: I have reviewed the patients History and Physical, chart, labs and discussed the procedure including the risks, benefits and alternatives for the proposed anesthesia with the patient or authorized representative who has indicated his/her understanding and acceptance.   Dental Advisory Given  Plan Discussed with: CRNA and Surgeon  Anesthesia Plan Comments:         Anesthesia Quick Evaluation

## 2012-02-28 NOTE — Interval H&P Note (Signed)
History and Physical Interval Note: 02/28/2012 1:16 PM  Danielle Duarte  has presented today for surgery, with the diagnosis of Left Ovarian Cyst  The various methods of treatment have been discussed with the patient and family. After consideration of risks, benefits and other options for treatment, the patient has consented to  Procedure: LAPAROSCOPY OPERATIVE (N/A), LEFT SALPINGO OOPHORECTOMY as a surgical intervention .  The patient's history has been reviewed, patient examined, no change in status, stable for surgery.    Recent Results (from the past 168 hour(s))  SURGICAL PCR SCREEN   Collection Time   02/22/12  4:20 PM      Component Value Range   MRSA, PCR NEGATIVE  NEGATIVE   Staphylococcus aureus POSITIVE (*) NEGATIVE  CBC   Collection Time   02/22/12  4:20 PM      Component Value Range   WBC 9.8  4.0 - 10.5 K/uL   RBC 4.69  3.87 - 5.11 MIL/uL   Hemoglobin 13.6  12.0 - 15.0 g/dL   HCT 57.8  46.9 - 62.9 %   MCV 89.3  78.0 - 100.0 fL   MCH 29.0  26.0 - 34.0 pg   MCHC 32.5  30.0 - 36.0 g/dL   RDW 52.8  41.3 - 24.4 %   Platelets 331  150 - 400 K/uL   I have reviewed the patient's chart and labs.  Questions were answered to the patient's satisfaction.  To OR when ready.  Jaynie Collins, MD, FACOG Attending Obstetrician & Gynecologist Faculty Practice, [facility] [place]

## 2012-02-29 ENCOUNTER — Encounter (HOSPITAL_COMMUNITY): Payer: Self-pay | Admitting: Obstetrics & Gynecology

## 2012-03-05 ENCOUNTER — Encounter: Payer: Self-pay | Admitting: Obstetrics & Gynecology

## 2012-03-18 ENCOUNTER — Encounter: Payer: BC Managed Care – PPO | Admitting: Obstetrics & Gynecology

## 2012-03-20 ENCOUNTER — Ambulatory Visit (INDEPENDENT_AMBULATORY_CARE_PROVIDER_SITE_OTHER): Payer: BC Managed Care – PPO | Admitting: Obstetrics & Gynecology

## 2012-03-20 ENCOUNTER — Encounter: Payer: Self-pay | Admitting: Obstetrics & Gynecology

## 2012-03-20 VITALS — BP 118/72 | HR 77 | Ht 65.0 in | Wt 180.0 lb

## 2012-03-20 DIAGNOSIS — D279 Benign neoplasm of unspecified ovary: Secondary | ICD-10-CM

## 2012-03-20 DIAGNOSIS — Z09 Encounter for follow-up examination after completed treatment for conditions other than malignant neoplasm: Secondary | ICD-10-CM

## 2012-03-20 NOTE — Progress Notes (Signed)
  Subjective:     Danielle Duarte is a 52 y.o. G2P2 female who presents to the clinic after laparoscopic LSO for large ovarian cysts. Pathology was benign. She reports mild incisional pan and bloating due to gas, no other concerns.  Eating a regular diet without difficulty. Bowel movements are normal. Pain is controlled with current analgesics. Medications being used: ibuprofen (OTC).  The following portions of the patient's history were reviewed and updated as appropriate: allergies, current medications, past family history, past medical history, past social history, past surgical history and problem list. Last pap was on 05/02/11 and was ASCUS, negative HPV, needs repeat next month. Last mammogram was also in 04/2011, patient will schedule next mammogram.  Review of Systems Pertinent items are noted in HPI.    Objective:    BP 118/72  Pulse 77  Ht 5\' 5"  (1.651 m)  Wt 180 lb (81.647 kg)  BMI 29.95 kg/m2  LMP 11/23/2011 General:  cooperative and no distress  Abdomen: soft, bowel sounds active, non-tender  Incision:   healing well, no drainage, no erythema, no hernia, no seroma, no swelling, no dehiscence, incision well approximated     Pathology 12/03/21/11 Fallopian tube, left, and ovary - OVARIAN SEROUS CYSTADENOFIBROMA. - BENIGN FALLOPIAN TUBE TISSUE. - NO ATYPIA OR MALIGNANCY IDENTIFIED. Assessment:    Doing well postoperatively. Operative findings again reviewed. Pathology report discussed.    Plan:   1. Continue any current medications. Recommended simethicone for bloating.  2. Wound care discussed. 3. Activity restrictions: none 4. Anticipated return to work: work Physicist, medical provided. 5. Follow up next month for annual exam and pap smear

## 2012-03-20 NOTE — Patient Instructions (Addendum)
Return to clinic for any scheduled appointments or for any gynecologic concerns as needed.   

## 2012-03-28 ENCOUNTER — Encounter: Payer: BC Managed Care – PPO | Admitting: Obstetrics & Gynecology

## 2012-05-20 ENCOUNTER — Other Ambulatory Visit: Payer: Self-pay

## 2012-05-20 DIAGNOSIS — Z1231 Encounter for screening mammogram for malignant neoplasm of breast: Secondary | ICD-10-CM

## 2012-06-04 ENCOUNTER — Ambulatory Visit (INDEPENDENT_AMBULATORY_CARE_PROVIDER_SITE_OTHER): Payer: BC Managed Care – PPO | Admitting: Obstetrics & Gynecology

## 2012-06-04 ENCOUNTER — Encounter: Payer: Self-pay | Admitting: Obstetrics & Gynecology

## 2012-06-04 VITALS — BP 138/82 | HR 73 | Ht 65.0 in | Wt 184.0 lb

## 2012-06-04 DIAGNOSIS — Z1151 Encounter for screening for human papillomavirus (HPV): Secondary | ICD-10-CM

## 2012-06-04 DIAGNOSIS — R300 Dysuria: Secondary | ICD-10-CM

## 2012-06-04 DIAGNOSIS — Z01419 Encounter for gynecological examination (general) (routine) without abnormal findings: Secondary | ICD-10-CM

## 2012-06-04 DIAGNOSIS — Z124 Encounter for screening for malignant neoplasm of cervix: Secondary | ICD-10-CM

## 2012-06-04 LAB — POCT URINALYSIS DIPSTICK
Bilirubin, UA: NEGATIVE
Glucose, UA: NEGATIVE
Nitrite, UA: NEGATIVE
Spec Grav, UA: 1.015
Urobilinogen, UA: 0.2
pH, UA: 5

## 2012-06-04 NOTE — Progress Notes (Signed)
  Subjective:     Danielle Duarte is a 52 y.o. G2P2 female and is here for a comprehensive physical exam. The patient reports no problems.  History   Social History  . Marital Status: Divorced    Spouse Name: N/A    Number of Children: N/A  . Years of Education: N/A   Occupational History  . Not on file.   Social History Main Topics  . Smoking status: Former Games developer  . Smokeless tobacco: Not on file  . Alcohol Use: No  . Drug Use: No  . Sexually Active: No   Other Topics Concern  . Not on file   Social History Narrative  . No narrative on file   Health Maintenance  Topic Date Due  . Influenza Vaccine  11/12/1958  . Tetanus/tdap  10/10/1977  . Colonoscopy  10/10/2008  . Mammogram  04/24/2013  . Pap Smear  05/01/2014    The following portions of the patient's history were reviewed and updated as appropriate: allergies, current medications, past family history, past medical history, past social history, past surgical history and problem list.  Review of Systems Pertinent items are noted in HPI.   Objective:   BP 138/82  Pulse 73  Ht 5\' 5"  (1.651 m)  Wt 184 lb (83.462 kg)  BMI 30.62 kg/m2 GENERAL: Well-developed, well-nourished female in no acute distress.  HEENT: Normocephalic, atraumatic. Sclerae anicteric.  NECK: Supple. Normal thyroid.  LUNGS: Clear to auscultation bilaterally.  HEART: Regular rate and rhythm. BREASTS: Symmetric in size. No masses, skin changes, nipple drainage, or lymphadenopathy. ABDOMEN: Soft, nontender, nondistended. No organomegaly. PELVIC: Normal external female genitalia. Vagina is pink and rugated.  Normal discharge. Normal cervix contour. Pap smear obtained. Uterus is normal in size. No adnexal mass or tenderness.  EXTREMITIES: No cyanosis, clubbing, or edema, 2+ distal pulses.   Assessment:    Healthy female exam.   Plan:   Pap done, will follow up results and manage accordingly. Mammogram scheduled for tomorrow, will follow up  results Routine preventative health maintenance measures emphasized

## 2012-06-04 NOTE — Patient Instructions (Signed)
Preventive Care for Adults, Female A healthy lifestyle and preventive care can promote health and wellness. Preventive health guidelines for women include the following key practices.  A routine yearly physical is a good way to check with your caregiver about your health and preventive screening. It is a chance to share any concerns and updates on your health, and to receive a thorough exam.  Visit your dentist for a routine exam and preventive care every 6 months. Brush your teeth twice a day and floss once a day. Good oral hygiene prevents tooth decay and gum disease.  The frequency of eye exams is based on your age, health, family medical history, use of contact lenses, and other factors. Follow your caregiver's recommendations for frequency of eye exams.  Eat a healthy diet. Foods like vegetables, fruits, whole grains, low-fat dairy products, and lean protein foods contain the nutrients you need without too many calories. Decrease your intake of foods high in solid fats, added sugars, and salt. Eat the right amount of calories for you.Get information about a proper diet from your caregiver, if necessary.  Regular physical exercise is one of the most important things you can do for your health. Most adults should get at least 150 minutes of moderate-intensity exercise (any activity that increases your heart rate and causes you to sweat) each week. In addition, most adults need muscle-strengthening exercises on 2 or more days a week.  Maintain a healthy weight. The body mass index (BMI) is a screening tool to identify possible weight problems. It provides an estimate of body fat based on height and weight. Your caregiver can help determine your BMI, and can help you achieve or maintain a healthy weight.For adults 20 years and older:  A BMI below 18.5 is considered underweight.  A BMI of 18.5 to 24.9 is normal.  A BMI of 25 to 29.9 is considered overweight.  A BMI of 30 and above is  considered obese.  Maintain normal blood lipids and cholesterol levels by exercising and minimizing your intake of saturated fat. Eat a balanced diet with plenty of fruit and vegetables. Blood tests for lipids and cholesterol should begin at age 41 and be repeated every 5 years. If your lipid or cholesterol levels are high, you are over 50, or you are at high risk for heart disease, you may need your cholesterol levels checked more frequently.Ongoing high lipid and cholesterol levels should be treated with medicines if diet and exercise are not effective.  If you smoke, find out from your caregiver how to quit. If you do not use tobacco, do not start.  If you are pregnant, do not drink alcohol. If you are breastfeeding, be very cautious about drinking alcohol. If you are not pregnant and choose to drink alcohol, do not exceed 1 drink per day. One drink is considered to be 12 ounces (355 mL) of beer, 5 ounces (148 mL) of wine, or 1.5 ounces (44 mL) of liquor.  Avoid use of street drugs. Do not share needles with anyone. Ask for help if you need support or instructions about stopping the use of drugs.  High blood pressure causes heart disease and increases the risk of stroke. Your blood pressure should be checked at least every 1 to 2 years. Ongoing high blood pressure should be treated with medicines if weight loss and exercise are not effective.  If you are 65 to 54 years old, ask your caregiver if you should take aspirin to prevent strokes.  Diabetes  screening involves taking a blood sample to check your fasting blood sugar level. This should be done once every 3 years, after age 45, if you are within normal weight and without risk factors for diabetes. Testing should be considered at a younger age or be carried out more frequently if you are overweight and have at least 1 risk factor for diabetes.  Breast cancer screening is essential preventive care for women. You should practice "breast  self-awareness." This means understanding the normal appearance and feel of your breasts and may include breast self-examination. Any changes detected, no matter how small, should be reported to a caregiver. Women in their 20s and 30s should have a clinical breast exam (CBE) by a caregiver as part of a regular health exam every 1 to 3 years. After age 40, women should have a CBE every year. Starting at age 40, women should consider having a mammography (breast X-ray test) every year. Women who have a family history of breast cancer should talk to their caregiver about genetic screening. Women at a high risk of breast cancer should talk to their caregivers about having magnetic resonance imaging (MRI) and a mammography every year.  The Pap test is a screening test for cervical cancer. A Pap test can show cell changes on the cervix that might become cervical cancer if left untreated. A Pap test is a procedure in which cells are obtained and examined from the lower end of the uterus (cervix).  Women should have a Pap test starting at age 21.  Between ages 21 and 29, Pap tests should be repeated every 2 years.  Beginning at age 30, you should have a Pap test every 3 years as long as the past 3 Pap tests have been normal.  Some women have medical problems that increase the chance of getting cervical cancer. Talk to your caregiver about these problems. It is especially important to talk to your caregiver if a new problem develops soon after your last Pap test. In these cases, your caregiver may recommend more frequent screening and Pap tests.  The above recommendations are the same for women who have or have not gotten the vaccine for human papillomavirus (HPV).  If you had a hysterectomy for a problem that was not cancer or a condition that could lead to cancer, then you no longer need Pap tests. Even if you no longer need a Pap test, a regular exam is a good idea to make sure no other problems are  starting.  If you are between ages 65 and 70, and you have had normal Pap tests going back 10 years, you no longer need Pap tests. Even if you no longer need a Pap test, a regular exam is a good idea to make sure no other problems are starting.  If you have had past treatment for cervical cancer or a condition that could lead to cancer, you need Pap tests and screening for cancer for at least 20 years after your treatment.  If Pap tests have been discontinued, risk factors (such as a new sexual partner) need to be reassessed to determine if screening should be resumed.  The HPV test is an additional test that may be used for cervical cancer screening. The HPV test looks for the virus that can cause the cell changes on the cervix. The cells collected during the Pap test can be tested for HPV. The HPV test could be used to screen women aged 30 years and older, and should   be used in women of any age who have unclear Pap test results. After the age of 30, women should have HPV testing at the same frequency as a Pap test.  Colorectal cancer can be detected and often prevented. Most routine colorectal cancer screening begins at the age of 50 and continues through age 75. However, your caregiver may recommend screening at an earlier age if you have risk factors for colon cancer. On a yearly basis, your caregiver may provide home test kits to check for hidden blood in the stool. Use of a small camera at the end of a tube, to directly examine the colon (sigmoidoscopy or colonoscopy), can detect the earliest forms of colorectal cancer. Talk to your caregiver about this at age 50, when routine screening begins. Direct examination of the colon should be repeated every 5 to 10 years through age 75, unless early forms of pre-cancerous polyps or small growths are found.  Hepatitis C blood testing is recommended for all people born from 1945 through 1965 and any individual with known risks for hepatitis C.  Practice  safe sex. Use condoms and avoid high-risk sexual practices to reduce the spread of sexually transmitted infections (STIs). STIs include gonorrhea, chlamydia, syphilis, trichomonas, herpes, HPV, and human immunodeficiency virus (HIV). Herpes, HIV, and HPV are viral illnesses that have no cure. They can result in disability, cancer, and death. Sexually active women aged 25 and younger should be checked for chlamydia. Older women with new or multiple partners should also be tested for chlamydia. Testing for other STIs is recommended if you are sexually active and at increased risk.  Osteoporosis is a disease in which the bones lose minerals and strength with aging. This can result in serious bone fractures. The risk of osteoporosis can be identified using a bone density scan. Women ages 65 and over and women at risk for fractures or osteoporosis should discuss screening with their caregivers. Ask your caregiver whether you should take a calcium supplement or vitamin D to reduce the rate of osteoporosis.  Menopause can be associated with physical symptoms and risks. Hormone replacement therapy is available to decrease symptoms and risks. You should talk to your caregiver about whether hormone replacement therapy is right for you.  Use sunscreen with sun protection factor (SPF) of 30 or more. Apply sunscreen liberally and repeatedly throughout the day. You should seek shade when your shadow is shorter than you. Protect yourself by wearing long sleeves, pants, a wide-brimmed hat, and sunglasses year round, whenever you are outdoors.  Once a month, do a whole body skin exam, using a mirror to look at the skin on your back. Notify your caregiver of new moles, moles that have irregular borders, moles that are larger than a pencil eraser, or moles that have changed in shape or color.  Stay current with required immunizations.  Influenza. You need a dose every fall (or winter). The composition of the flu vaccine  changes each year, so being vaccinated once is not enough.  Pneumococcal polysaccharide. You need 1 to 2 doses if you smoke cigarettes or if you have certain chronic medical conditions. You need 1 dose at age 65 (or older) if you have never been vaccinated.  Tetanus, diphtheria, pertussis (Tdap, Td). Get 1 dose of Tdap vaccine if you are younger than age 65, are over 65 and have contact with an infant, are a healthcare worker, are pregnant, or simply want to be protected from whooping cough. After that, you need a Td   booster dose every 10 years. Consult your caregiver if you have not had at least 3 tetanus and diphtheria-containing shots sometime in your life or have a deep or dirty wound.  HPV. You need this vaccine if you are a woman age 26 or younger. The vaccine is given in 3 doses over 6 months.  Measles, mumps, rubella (MMR). You need at least 1 dose of MMR if you were born in 1957 or later. You may also need a second dose.  Meningococcal. If you are age 19 to 21 and a first-year college student living in a residence hall, or have one of several medical conditions, you need to get vaccinated against meningococcal disease. You may also need additional booster doses.  Zoster (shingles). If you are age 60 or older, you should get this vaccine.  Varicella (chickenpox). If you have never had chickenpox or you were vaccinated but received only 1 dose, talk to your caregiver to find out if you need this vaccine.  Hepatitis A. You need this vaccine if you have a specific risk factor for hepatitis A virus infection or you simply wish to be protected from this disease. The vaccine is usually given as 2 doses, 6 to 18 months apart.  Hepatitis B. You need this vaccine if you have a specific risk factor for hepatitis B virus infection or you simply wish to be protected from this disease. The vaccine is given in 3 doses, usually over 6 months. Preventive Services / Frequency Ages 19 to 39  Blood  pressure check.** / Every 1 to 2 years.  Lipid and cholesterol check.** / Every 5 years beginning at age 20.  Clinical breast exam.** / Every 3 years for women in their 20s and 30s.  Pap test.** / Every 2 years from ages 21 through 29. Every 3 years starting at age 30 through age 65 or 70 with a history of 3 consecutive normal Pap tests.  HPV screening.** / Every 3 years from ages 30 through ages 65 to 70 with a history of 3 consecutive normal Pap tests.  Hepatitis C blood test.** / For any individual with known risks for hepatitis C.  Skin self-exam. / Monthly.  Influenza immunization.** / Every year.  Pneumococcal polysaccharide immunization.** / 1 to 2 doses if you smoke cigarettes or if you have certain chronic medical conditions.  Tetanus, diphtheria, pertussis (Tdap, Td) immunization. / A one-time dose of Tdap vaccine. After that, you need a Td booster dose every 10 years.  HPV immunization. / 3 doses over 6 months, if you are 26 and younger.  Measles, mumps, rubella (MMR) immunization. / You need at least 1 dose of MMR if you were born in 1957 or later. You may also need a second dose.  Meningococcal immunization. / 1 dose if you are age 19 to 21 and a first-year college student living in a residence hall, or have one of several medical conditions, you need to get vaccinated against meningococcal disease. You may also need additional booster doses.  Varicella immunization.** / Consult your caregiver.  Hepatitis A immunization.** / Consult your caregiver. 2 doses, 6 to 18 months apart.  Hepatitis B immunization.** / Consult your caregiver. 3 doses usually over 6 months. Ages 40 to 64  Blood pressure check.** / Every 1 to 2 years.  Lipid and cholesterol check.** / Every 5 years beginning at age 20.  Clinical breast exam.** / Every year after age 40.  Mammogram.** / Every year beginning at age 40   and continuing for as long as you are in good health. Consult with your  caregiver.  Pap test.** / Every 3 years starting at age 30 through age 65 or 70 with a history of 3 consecutive normal Pap tests.  HPV screening.** / Every 3 years from ages 30 through ages 65 to 70 with a history of 3 consecutive normal Pap tests.  Fecal occult blood test (FOBT) of stool. / Every year beginning at age 50 and continuing until age 75. You may not need to do this test if you get a colonoscopy every 10 years.  Flexible sigmoidoscopy or colonoscopy.** / Every 5 years for a flexible sigmoidoscopy or every 10 years for a colonoscopy beginning at age 50 and continuing until age 75.  Hepatitis C blood test.** / For all people born from 1945 through 1965 and any individual with known risks for hepatitis C.  Skin self-exam. / Monthly.  Influenza immunization.** / Every year.  Pneumococcal polysaccharide immunization.** / 1 to 2 doses if you smoke cigarettes or if you have certain chronic medical conditions.  Tetanus, diphtheria, pertussis (Tdap, Td) immunization.** / A one-time dose of Tdap vaccine. After that, you need a Td booster dose every 10 years.  Measles, mumps, rubella (MMR) immunization. / You need at least 1 dose of MMR if you were born in 1957 or later. You may also need a second dose.  Varicella immunization.** / Consult your caregiver.  Meningococcal immunization.** / Consult your caregiver.  Hepatitis A immunization.** / Consult your caregiver. 2 doses, 6 to 18 months apart.  Hepatitis B immunization.** / Consult your caregiver. 3 doses, usually over 6 months. Ages 65 and over  Blood pressure check.** / Every 1 to 2 years.  Lipid and cholesterol check.** / Every 5 years beginning at age 20.  Clinical breast exam.** / Every year after age 40.  Mammogram.** / Every year beginning at age 40 and continuing for as long as you are in good health. Consult with your caregiver.  Pap test.** / Every 3 years starting at age 30 through age 65 or 70 with a 3  consecutive normal Pap tests. Testing can be stopped between 65 and 70 with 3 consecutive normal Pap tests and no abnormal Pap or HPV tests in the past 10 years.  HPV screening.** / Every 3 years from ages 30 through ages 65 or 70 with a history of 3 consecutive normal Pap tests. Testing can be stopped between 65 and 70 with 3 consecutive normal Pap tests and no abnormal Pap or HPV tests in the past 10 years.  Fecal occult blood test (FOBT) of stool. / Every year beginning at age 50 and continuing until age 75. You may not need to do this test if you get a colonoscopy every 10 years.  Flexible sigmoidoscopy or colonoscopy.** / Every 5 years for a flexible sigmoidoscopy or every 10 years for a colonoscopy beginning at age 50 and continuing until age 75.  Hepatitis C blood test.** / For all people born from 1945 through 1965 and any individual with known risks for hepatitis C.  Osteoporosis screening.** / A one-time screening for women ages 65 and over and women at risk for fractures or osteoporosis.  Skin self-exam. / Monthly.  Influenza immunization.** / Every year.  Pneumococcal polysaccharide immunization.** / 1 dose at age 65 (or older) if you have never been vaccinated.  Tetanus, diphtheria, pertussis (Tdap, Td) immunization. / A one-time dose of Tdap vaccine if you are over   65 and have contact with an infant, are a Research scientist (physical sciences), or simply want to be protected from whooping cough. After that, you need a Td booster dose every 10 years.  Varicella immunization.** / Consult your caregiver.  Meningococcal immunization.** / Consult your caregiver.  Hepatitis A immunization.** / Consult your caregiver. 2 doses, 6 to 18 months apart.  Hepatitis B immunization.** / Check with your caregiver. 3 doses, usually over 6 months. ** Family history and personal history of risk and conditions may change your caregiver's recommendations. Document Released: 04/25/2001 Document Revised: 05/22/2011  Document Reviewed: 07/25/2010 [facility] Patient Information 2013 [place], [place].  Thank you for enrolling in MyChart. Please follow the instructions below to securely access your online medical record. MyChart allows you to send messages to your doctor, view your test results, manage appointments, and more.   How Do I Sign Up? 1. In your Internet browser, go to [employer] and enter [email or web address]. 2. Click on the Sign Up Now link in the Sign In box. You will see the New Member Sign Up page. 3. Enter your MyChart Access Code exactly as it appears below. You will not need to use this code after you've completed the sign-up process. If you do not sign up before the expiration date, you must request a new code. MyChart Access Code: [email or web address] Expires: 07/04/2012  3:50 PM  4. Enter your Social Security Number ([email or web address]) and Date of Birth (mm/dd/yyyy) as indicated and click Submit. You will be taken to the next sign-up page. 5. Create a MyChart ID. This will be your MyChart login ID and cannot be changed, so think of one that is secure and easy to remember. 6. Create a MyChart password. You can change your password at any time. 7. Enter your Password Reset Question and Answer. This can be used at a later time if you forget your password.  8. Enter your e-mail address. You will receive e-mail notification when new information is available in MyChart. 9. Click Sign Up. You can now view your medical record.   Additional Information Remember, MyChart is NOT to be used for urgent needs. For medical emergencies, dial 911.

## 2012-06-04 NOTE — Progress Notes (Signed)
Here today for yearly physical and pap smear.  Does have a "lump" on her right side that she would like checked. Also having some urinary frequency.

## 2012-06-05 ENCOUNTER — Ambulatory Visit
Admission: RE | Admit: 2012-06-05 | Discharge: 2012-06-05 | Disposition: A | Discharge status: Home / Self Care | Payer: BC Managed Care – PPO | Source: Ambulatory Visit

## 2012-06-05 DIAGNOSIS — Z1231 Encounter for screening mammogram for malignant neoplasm of breast: Secondary | ICD-10-CM

## 2012-06-12 ENCOUNTER — Encounter: Payer: Self-pay | Admitting: Obstetrics & Gynecology

## 2012-06-12 DIAGNOSIS — IMO0002 Reserved for concepts with insufficient information to code with codable children: Secondary | ICD-10-CM | POA: Insufficient documentation

## 2012-06-12 HISTORY — DX: Reserved for concepts with insufficient information to code with codable children: IMO0002

## 2013-01-11 HISTORY — PX: HERNIA REPAIR: SHX51

## 2013-01-23 ENCOUNTER — Ambulatory Visit: Payer: Self-pay | Admitting: Surgery

## 2013-01-31 ENCOUNTER — Ambulatory Visit: Payer: Self-pay | Admitting: Surgery

## 2013-05-01 ENCOUNTER — Other Ambulatory Visit: Payer: Self-pay

## 2013-05-01 DIAGNOSIS — Z1231 Encounter for screening mammogram for malignant neoplasm of breast: Secondary | ICD-10-CM

## 2013-06-30 ENCOUNTER — Ambulatory Visit
Admission: RE | Admit: 2013-06-30 | Discharge: 2013-06-30 | Disposition: A | Discharge status: Home / Self Care | Payer: BC Managed Care – PPO | Source: Ambulatory Visit

## 2013-06-30 DIAGNOSIS — Z1231 Encounter for screening mammogram for malignant neoplasm of breast: Secondary | ICD-10-CM

## 2013-06-30 LAB — HM MAMMOGRAPHY

## 2013-09-10 ENCOUNTER — Encounter: Payer: Self-pay | Admitting: Obstetrics & Gynecology

## 2013-09-10 ENCOUNTER — Ambulatory Visit (INDEPENDENT_AMBULATORY_CARE_PROVIDER_SITE_OTHER): Payer: BC Managed Care – PPO | Admitting: Obstetrics & Gynecology

## 2013-09-10 VITALS — BP 143/84 | HR 73 | Ht 64.5 in | Wt 189.0 lb

## 2013-09-10 DIAGNOSIS — Z0189 Encounter for other specified special examinations: Secondary | ICD-10-CM

## 2013-09-10 DIAGNOSIS — Z124 Encounter for screening for malignant neoplasm of cervix: Secondary | ICD-10-CM

## 2013-09-10 DIAGNOSIS — Z1151 Encounter for screening for human papillomavirus (HPV): Secondary | ICD-10-CM

## 2013-09-10 DIAGNOSIS — N390 Urinary tract infection, site not specified: Secondary | ICD-10-CM

## 2013-09-10 DIAGNOSIS — Z01419 Encounter for gynecological examination (general) (routine) without abnormal findings: Secondary | ICD-10-CM

## 2013-09-10 LAB — POCT URINALYSIS DIPSTICK
Bilirubin, UA: NEGATIVE
Blood, UA: NEGATIVE
Glucose, UA: NEGATIVE
Ketones, UA: NEGATIVE
Leukocytes, UA: NEGATIVE
Nitrite, UA: NEGATIVE
Protein, UA: NEGATIVE
Spec Grav, UA: >=1.03
Urobilinogen, UA: NEGATIVE
pH, UA: 6

## 2013-09-10 NOTE — Patient Instructions (Signed)
Human Papillomavirus Human papillomavirus (HPV) is the most common sexually transmitted infection (STI) and is highly contagious. HPV infections cause genital warts and cancers to the outlet of the womb (cervix), birth canal (vagina), opening of the birth canal (vulva), and anus. There are over 100 types of HPV. Four types of HPV are responsible for causing 70% of all cervical cancers. Ninety percent of anal cancers and genital warts are caused by HPV. Unless you have wart-like lesions in the throat or genital warts that you can see or feel, HPV usually does not cause symptoms. Therefore, people can be infected for long periods and pass it on to others without knowing it. HPV in pregnancy usually does not cause a problem for the mother or baby. If the mother has genital warts, the baby rarely gets infected. When the HPV infection is found to be pre-cancerous on the cervix, vagina, or vulva, the mother will be followed closely during the pregnancy. Any needed treatment will be done after the baby is born. CAUSES   Having unprotected sex. HPV can be spread by oral, vaginal, or anal sexual activity.  Having several sex partners.  Having a sex partner who has other sex partners.  Having or having had another sexually transmitted infection. SYMPTOMS   More than 90% of people carrying HPV cannot tell anything is wrong.  Wart-like lesions in the throat (from having oral sex).  Warts in the infected skin or mucous membranes.  Genital warts may itch, burn, or bleed.  Genital warts may be painful or bleed during sexual intercourse. DIAGNOSIS   Genital warts are easily seen with the naked eye.  Currently, there is no FDA-approved test to detect HPV in males.  In females, a Pap test can show cells which are infected with HPV.  In females, a scope can be used to view the cervix (colposcopy). A colposcopy can be performed if the pelvic exam or Pap test is abnormal.  In females, a sample of tissue  may be removed (biopsy) during the colposcopy. TREATMENT   Treatment of genital warts can include:  Podophyllin lotion or gel.  Bichloroacetic acid (BCA) or trichloroacetic acid (TCA).  Podofilox solution or gel.  Imiquimod cream.  Interferon injections.  Use of a probe to apply extreme cold (cryotherapy).  Application of an intense beam of light (laser treatment).  Use of a probe to apply extreme heat (electrocautery).  Surgery.  HPV of the cervix, vagina, or vulva can be treated with:  Cryotherapy.  Laser treatment.  Electrocautery.  Surgery. Your caregiver will follow you closely after you are treated. This is because the HPV can come back and may need treatment again. HOME CARE INSTRUCTIONS   Follow your caregiver's instructions regarding medications, Pap tests, and follow-up exams.  Do not touch or scratch the warts.  Do not treat genital warts with medication used for treating hand warts.  Tell your sex partner about your infection because he or she may also need treatment.  Do not have sex while you are being treated.  After treatment, use condoms during sex to prevent future infections.  Have only 1 sex partner.  Have a sex partner who does not have other sex partners.  Use over-the-counter creams for itching or irritation as directed by your caregiver.  Use over-the-counter or prescription medicines for pain, discomfort, or fever as directed by your caregiver.  Do not douche or use tampons during treatment of HPV. PREVENTION   Talk to your caregiver about getting the HPV   vaccines. These vaccines prevent some HPV infections and cancers. It is recommended that the vaccine be given to males and females between the age of 41 and 26 years old. It will not work if you already have HPV and it is not recommended for pregnant women. The vaccines are not recommended for pregnant women.  Call your caregiver if you think you are pregnant and have the HPV.  A  PAP test is done to screen for cervical cancer.  The first PAP test should be done at age 20.  Between ages 55 and 15, PAP tests are repeated every 2 years.  Beginning at age 65, you are advised to have a PAP test every 3 years as long as your past 3 PAP tests have been normal.  Some women have medical problems that increase the chance of getting cervical cancer. Talk to your caregiver about these problems. It is especially important to talk to your caregiver if a new problem develops soon after your last PAP test. In these cases, your caregiver may recommend more frequent screening and Pap tests.  The above recommendations are the same for women who have or have not gotten the vaccine for HPV (Human Papillomavirus).  If you had a hysterectomy for a problem that was not a cancer or a condition that could lead to cancer, then you no longer need Pap tests. However, even if you no longer need a PAP test, a regular exam is a good idea to make sure no other problems are starting.   If you are between ages 40 and 65, and you have had normal Pap tests going back 10 years, you no longer need Pap tests. However, even if you no longer need a PAP test, a regular exam is a good idea to make sure no other problems are starting.  If you have had past treatment for cervical cancer or a condition that could lead to cancer, you need Pap tests and screening for cancer for at least 20 years after your treatment.  If Pap tests have been discontinued, risk factors (such as a new sexual partner)need to be re-assessed to determine if screening should be resumed.  Some women may need screenings more often if they are at high risk for cervical cancer. SEEK MEDICAL CARE IF:   The treated skin becomes red, swollen or painful.  You have an oral temperature above 102 F (38.9 C).  You feel generally ill.  You feel lumps or pimple-like projections in and around your genital area.  You develop bleeding of the  vagina or the treatment area.  You develop painful sexual intercourse. Document Released: 05/20/2003 Document Revised: 05/22/2011 Document Reviewed: 05/09/2007 [facility] Patient Information 2015 [place], [place]. This information is not intended to replace advice given to you by your health care provider. Make sure you discuss any questions you have with your health care provider.

## 2013-09-10 NOTE — Progress Notes (Signed)
Patient had a uti a few weeks ago and would like her urine checked to be sure it is completely gone.

## 2013-09-10 NOTE — Addendum Note (Signed)
Addended by: Erik Obey on: 09/10/2013 10:58 AM   Modules accepted: Orders

## 2013-09-10 NOTE — Progress Notes (Deleted)
Patient ID: Danielle Duarte, female   DOB: 1961/02/16, 53 y.o.   MRN: [id number] Subjective:    Danielle Duarte is a 53 y.o. female who presents for an annual exam. The patient has no complaints today. The patient {is/is not/has never been:13135} sexually active. GYN screening history: {gyn screen history:13142}. The patient wears seatbelts: {yes/no:311178}. The patient participates in regular exercise: {yes/no/not asked:9010}. Has the patient ever been transfused or tattooed?: {yes/no/not asked:9010}. The patient reports that there {is/is not:9024} domestic violence in her life.   Menstrual History: OB History   Grav Para Term Preterm Abortions TAB SAB Ect Mult Living   2 2        2       Menarche age: *** No LMP recorded. Patient is not currently having periods (Reason: Perimenopausal).    {Common ambulatory SmartLinks:19316}  Review of Systems {ros; complete:30496}    Objective:    {exam; complete:18323}.    Assessment:    Healthy female exam.    Plan:     {gyn plan:13146}

## 2013-09-10 NOTE — Progress Notes (Signed)
Patient ID: Danielle Duarte, female   DOB: 14-Nov-1960, 53 y.o.   MRN: [id number] Subjective:     Danielle Duarte is a 53 y.o. female here for a routine exam.  Current complaints: none.     Gynecologic History No LMP recorded. Patient is not currently having periods (Reason: Perimenopausal). Contraception: abstinence Last Pap: 06/2012. Results were: normal with + HR HPV  Last mammogram: 07/2013. Results were: normal  Obstetric History OB History  Gravida Para Term Preterm AB SAB TAB Ectopic Multiple Living  2 2        2     # Outcome Date GA Lbr Len/2nd Weight Sex Delivery Anes PTL Lv  2 PAR 9        Y  1 PAR 1993        Y       The following portions of the patient's history were reviewed and updated as appropriate: allergies, current medications, past family history, past medical history, past social history, past surgical history and problem list.  Review of Systems A comprehensive review of systems was negative.    Objective:    BP 143/84  Pulse 73  Ht 5' 4.5" (1.638 m)  Wt 189 lb (85.73 kg)  BMI 31.95 kg/m2  General Appearance:    Alert, cooperative, no distress, appears stated age                 Neck:   Supple, symmetrical, trachea midline, no adenopathy;    thyroid:  no enlargement/tenderness/nodules; no carotid   bruit or JVD  Back:     Symmetric, no curvature, ROM normal, no CVA tenderness  Lungs:     Clear to auscultation bilaterally, respirations unlabored  Chest Wall:    No tenderness or deformity   Heart:    Regular rate and rhythm, S1 and S2 normal, no murmur, rub   or gallop  Breast Exam:    No tenderness, masses, or nipple abnormality  Abdomen:     Soft, non-tender, bowel sounds active all four quadrants,    no masses, no organomegaly  Genitalia:    Normal female without lesion, discharge or tenderness     Extremities:   Extremities normal, atraumatic, no cyanosis or edema  Pulses:   2+ and symmetric all extremities  Skin:   Skin color, texture,  turgor normal, no rashes or lesions            Assessment:    Healthy female exam.  +HR HPV- reviewed with pt Reviewed mammogram with pt    Plan:    Follow up in: 1 year.   Yearly PAP for now F/u for mammogram in 1 year

## 2013-09-16 LAB — CYTOLOGY - PAP

## 2013-10-08 ENCOUNTER — Ambulatory Visit: Payer: Self-pay | Admitting: Allergy and Immunology

## 2013-12-10 LAB — BASIC METABOLIC PANEL
BUN: 12 mg/dL (ref 4–21)
Creatinine: 0.8 mg/dL (ref 0.5–1.1)
Glucose: 86 mg/dL
Potassium: 4.2 mmol/L (ref 3.4–5.3)
Sodium: 141 mmol/L (ref 137–147)

## 2013-12-16 DIAGNOSIS — R0681 Apnea, not elsewhere classified: Secondary | ICD-10-CM | POA: Insufficient documentation

## 2014-01-05 DIAGNOSIS — I471 Supraventricular tachycardia, unspecified: Secondary | ICD-10-CM

## 2014-01-05 HISTORY — DX: Supraventricular tachycardia: I47.1

## 2014-01-05 HISTORY — DX: Supraventricular tachycardia, unspecified: I47.10

## 2014-01-12 ENCOUNTER — Encounter: Payer: Self-pay | Admitting: Obstetrics & Gynecology

## 2014-05-28 LAB — LIPID PANEL
Cholesterol: 249 mg/dL — AB (ref 0–200)
HDL: 59 mg/dL (ref 35–70)
LDL Cholesterol: 162 mg/dL
Triglycerides: 142 mg/dL (ref 40–160)

## 2014-05-28 LAB — TSH: TSH: 6.62 u[IU]/mL — AB (ref 0.41–5.90)

## 2014-06-12 ENCOUNTER — Other Ambulatory Visit: Payer: Self-pay

## 2014-06-12 DIAGNOSIS — Z1231 Encounter for screening mammogram for malignant neoplasm of breast: Secondary | ICD-10-CM

## 2014-07-03 NOTE — Op Note (Signed)
PATIENT NAME:  Danielle Duarte, Danielle Duarte MR#:  [id number] DATE OF BIRTH:  [birth year]  DATE OF PROCEDURE:  01/31/2013  PREOPERATIVE DIAGNOSIS: Ventral hernia.   POSTOPERATIVE DIAGNOSIS: Ventral hernia.   PROCEDURE: Ventral hernia repair.   SURGEON: Rochel Brome, M.D.   ANESTHESIA: General.   INDICATIONS: This 54 year old female has chief complaint of bulge in the right lower abdomen. She reports last December had laparoscopic left salpingo-oophorectomy and ovary was removed through the laparoscopic port site, right lower quadrant. She developed a bulge at this site, some 7 cm in dimension with moderate discomfort. A ventral hernia was demonstrated on physical exam and repair recommended for definitive treatment.   The patient was placed on the operating table in the supine position under general anesthesia. The abdomen was prepared with ChloraPrep, draped in a sterile manner.   A transversely oriented right lower quadrant incision was made approximately a third of the way from the anterior/superior iliac spine to the umbilicus, approximately 6 cm in length, carried down through subcutaneous tissues, dissected down through Scarpa's fascia. There was a large amount of adipose tissue encountered. The fascia was approximately 2 inches deep to the skin. There was a large ventral hernia defect. There was a sac which was dissected free from surrounding structures. The sac was somewhat thin and after dissection and separating sac from the fascia, the sac was excised. It did not need to be sent for pathology. The external oblique aponeurosis, the internal oblique muscle and transversus abdominis were all identified. The fascial ring defect was approximately 4 cm and this was closed with a transversely oriented suture line of 0 Surgilon sutures, suturing all 3 muscle layers. Following this, an onlay mesh was used and selected Atrium mesh and cut to create piece of mesh which was approximately 2.8 cm wide and approximately  6 cm long, and this was placed over the repair and was sutured to the external oblique aponeurosis with running 0 Surgilon suture, which was placed along the edges of the mesh so that the mesh laid flat on the fascia. It was further noted hemostasis was intact, as electrocautery had been used for bleeding points throughout the case. The deep fascia was infiltrated with 0.5% Sensorcaine with epinephrine and also subcutaneous tissues were infiltrated using a total of 20 mL. The Scarpa's fascia was closed with interrupted 3-0 chromic, and the skin was closed with running 4-0 Monocryl subcuticular suture and Dermabond. The patient tolerated surgery satisfactorily and was then prepared for transfer to the recovery room.     ____________________________ Lenna Sciara. Rochel Brome, MD jws:dmm D: 01/31/2013 09:26:54 ET T: 01/31/2013 09:32:19 ET JOB#: [id number]  cc: Loreli Dollar, MD, <Dictator> Loreli Dollar MD ELECTRONICALLY SIGNED 01/31/2013 19:29

## 2014-07-27 ENCOUNTER — Ambulatory Visit: Payer: BC Managed Care – PPO

## 2014-09-21 ENCOUNTER — Ambulatory Visit
Admission: RE | Admit: 2014-09-21 | Discharge: 2014-09-21 | Disposition: A | Discharge status: Home / Self Care | Payer: BC Managed Care – PPO | Source: Ambulatory Visit

## 2014-09-21 ENCOUNTER — Other Ambulatory Visit: Payer: Self-pay

## 2014-09-21 DIAGNOSIS — Z1231 Encounter for screening mammogram for malignant neoplasm of breast: Secondary | ICD-10-CM

## 2014-12-07 ENCOUNTER — Telehealth: Payer: Self-pay | Admitting: Family Medicine

## 2014-12-07 DIAGNOSIS — E039 Hypothyroidism, unspecified: Secondary | ICD-10-CM | POA: Insufficient documentation

## 2014-12-07 DIAGNOSIS — E038 Other specified hypothyroidism: Secondary | ICD-10-CM

## 2014-12-07 NOTE — Telephone Encounter (Signed)
Printed.  Please notify patient. Thanks.  

## 2014-12-07 NOTE — Telephone Encounter (Signed)
Pt advised.   Thanks,   -Laura  

## 2014-12-07 NOTE — Telephone Encounter (Signed)
Pt called requesting a lab order to have her thyroid panel checked.  Her call back is [phone]  Thanks Con Memos

## 2014-12-12 LAB — T4 AND TSH
T4, Total: 5.7 ug/dL (ref 4.5–12.0)
TSH: 8.39 u[IU]/mL — ABNORMAL HIGH (ref 0.450–4.500)

## 2014-12-15 ENCOUNTER — Telehealth: Payer: Self-pay

## 2014-12-15 NOTE — Telephone Encounter (Signed)
-----   Message from Margarita Rana, MD sent at 12/12/2014  1:32 PM EDT ----- Labs stable. Still low thyroid, but not low enough to treat unless having symptoms like fatigue, hair loss, etc. Thanks.

## 2014-12-15 NOTE — Telephone Encounter (Signed)
LMTCB 12/15/2014  Thanks,   -Mickel Baas

## 2014-12-16 NOTE — Telephone Encounter (Signed)
Patient advised as below. Patient denies any symptoms and does not feel the need to start treatment. sd

## 2015-01-14 DIAGNOSIS — M179 Osteoarthritis of knee, unspecified: Secondary | ICD-10-CM | POA: Insufficient documentation

## 2015-01-14 DIAGNOSIS — M169 Osteoarthritis of hip, unspecified: Secondary | ICD-10-CM | POA: Insufficient documentation

## 2015-01-14 DIAGNOSIS — M1710 Unilateral primary osteoarthritis, unspecified knee: Secondary | ICD-10-CM | POA: Insufficient documentation

## 2015-03-30 ENCOUNTER — Encounter: Payer: Self-pay | Admitting: Family Medicine

## 2015-03-30 ENCOUNTER — Ambulatory Visit (INDEPENDENT_AMBULATORY_CARE_PROVIDER_SITE_OTHER): Payer: BC Managed Care – PPO | Admitting: Family Medicine

## 2015-03-30 VITALS — BP 130/80 | HR 78 | Temp 98.5°F | Resp 16 | Ht 65.0 in | Wt 195.0 lb

## 2015-03-30 DIAGNOSIS — I493 Ventricular premature depolarization: Secondary | ICD-10-CM | POA: Insufficient documentation

## 2015-03-30 DIAGNOSIS — J45909 Unspecified asthma, uncomplicated: Secondary | ICD-10-CM | POA: Insufficient documentation

## 2015-03-30 DIAGNOSIS — K4090 Unilateral inguinal hernia, without obstruction or gangrene, not specified as recurrent: Secondary | ICD-10-CM | POA: Insufficient documentation

## 2015-03-30 DIAGNOSIS — J309 Allergic rhinitis, unspecified: Secondary | ICD-10-CM | POA: Insufficient documentation

## 2015-03-30 DIAGNOSIS — R09A2 Foreign body sensation, throat: Secondary | ICD-10-CM | POA: Insufficient documentation

## 2015-03-30 DIAGNOSIS — F458 Other somatoform disorders: Secondary | ICD-10-CM | POA: Insufficient documentation

## 2015-03-30 DIAGNOSIS — Z23 Encounter for immunization: Secondary | ICD-10-CM

## 2015-03-30 DIAGNOSIS — Z0000 Encounter for general adult medical examination without abnormal findings: Secondary | ICD-10-CM | POA: Diagnosis not present

## 2015-03-30 DIAGNOSIS — E039 Hypothyroidism, unspecified: Secondary | ICD-10-CM

## 2015-03-30 DIAGNOSIS — Z6834 Body mass index (BMI) 34.0-34.9, adult: Secondary | ICD-10-CM | POA: Insufficient documentation

## 2015-03-30 DIAGNOSIS — Z6833 Body mass index (BMI) 33.0-33.9, adult: Secondary | ICD-10-CM | POA: Insufficient documentation

## 2015-03-30 DIAGNOSIS — R319 Hematuria, unspecified: Secondary | ICD-10-CM | POA: Insufficient documentation

## 2015-03-30 DIAGNOSIS — Z87898 Personal history of other specified conditions: Secondary | ICD-10-CM | POA: Insufficient documentation

## 2015-03-30 DIAGNOSIS — M25559 Pain in unspecified hip: Secondary | ICD-10-CM | POA: Insufficient documentation

## 2015-03-30 DIAGNOSIS — J329 Chronic sinusitis, unspecified: Secondary | ICD-10-CM | POA: Insufficient documentation

## 2015-03-30 DIAGNOSIS — L608 Other nail disorders: Secondary | ICD-10-CM | POA: Insufficient documentation

## 2015-03-30 DIAGNOSIS — R600 Localized edema: Secondary | ICD-10-CM | POA: Insufficient documentation

## 2015-03-30 DIAGNOSIS — E038 Other specified hypothyroidism: Secondary | ICD-10-CM

## 2015-03-30 DIAGNOSIS — E785 Hyperlipidemia, unspecified: Secondary | ICD-10-CM | POA: Insufficient documentation

## 2015-03-30 NOTE — Progress Notes (Signed)
Patient ID: Danielle Duarte, female   DOB: September 14, 1960, 55 y.o.   MRN: [id number]       Patient: Danielle Duarte, Female    DOB: 01/25/1961, 55 y.o.   MRN: [id number] Visit Date: 03/30/2015  Today's Provider: Margarita Rana, MD   Chief Complaint  Patient presents with  . Annual Exam   Subjective:    Annual physical exam Danielle Duarte is a 55 y.o. female who presents today for health maintenance and complete physical. She feels well. She reports exercising none. She reports she is sleeping well. 12/01/13 CPE 09/10/13 Pap-neg (GYN) 09/22/14 Mammo-BI-RADS 1 10/10/10 Colon-diverticulosis and internal hemorrhoids recheck in 5-10 yrs  Lab Results  Component Value Date   WBC 9.8 02/22/2012   HGB 13.6 02/22/2012   HCT 41.9 02/22/2012   PLT 331 02/22/2012   GLUCOSE 85 05/22/2011   CHOL 249* 05/28/2014   TRIG 142 05/28/2014   HDL 59 05/28/2014   LDLCALC 162 05/28/2014   ALT 18 05/22/2011   AST 20 05/22/2011   NA 141 12/10/2013   K 4.2 12/10/2013   CL 105 05/22/2011   CREATININE 0.8 12/10/2013   BUN 12 12/10/2013   CO2 25 05/22/2011   TSH 8.390* 12/11/2014    -----------------------------------------------------------------   Review of Systems  Constitutional: Negative.   HENT: Negative.   Eyes: Negative.   Respiratory: Negative.   Cardiovascular: Negative.   Gastrointestinal: Negative.   Endocrine: Negative.   Genitourinary: Negative.   Musculoskeletal: Positive for joint swelling and arthralgias.  Skin: Negative.   Allergic/Immunologic: Positive for environmental allergies.  Neurological: Negative.   Hematological: Negative.   Psychiatric/Behavioral: Negative.     Social History      She  reports that she quit smoking about 28 years ago. She has never used smokeless tobacco. She reports that she does not drink alcohol or use illicit drugs.       Social History   Social History  . Marital Status: Divorced    Spouse Name: N/A  . Number of Children: N/A  .  Years of Education: N/A   Social History Main Topics  . Smoking status: Former Smoker    Quit date: 03/13/1987  . Smokeless tobacco: Never Used  . Alcohol Use: No  . Drug Use: No  . Sexual Activity: No   Other Topics Concern  . None   Social History Narrative    Past Medical History  Diagnosis Date  . Abnormal Pap smear   . Diarrhea   . Fibroids   . Arthritis     right knee  . No pertinent past medical history   . PONV (postoperative nausea and vomiting)   . HLD (hyperlipidemia) 03/30/2015     Patient Active Problem List   Diagnosis Date Noted  . Allergic rhinitis 03/30/2015  . Allergic asthma 03/30/2015  . Direct hernia 03/30/2015  . Edema extremities 03/30/2015  . 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/30/2015  . Blood in the urine 03/30/2015  . Arthralgia of hip 03/30/2015  . History of palpitations 03/30/2015  . HLD (hyperlipidemia) 03/30/2015  . Gonalgia 03/30/2015  . Adiposity 03/30/2015  . Beat, premature ventricular 03/30/2015  . Sinus infection 03/30/2015  . Nail deformity 03/30/2015  . Degenerative arthritis of hip 01/14/2015  . Arthritis of knee, degenerative 01/14/2015  . Subclinical hypothyroidism 12/07/2014  . Paroxysmal supraventricular tachycardia ([place]) 01/05/2014  . Breathlessness on exertion 12/16/2013  . Normal cytology on  Pap smear with positive HPV test 06/12/2012  . 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 DUB (dysfunctional uterine bleeding) 12/08/2011    Past Surgical History  Procedure Laterality Date  . Knee surgery    . Appendectomy    . Colposcopy  03/01/10  . Colonoscopy  10/10/2010  . Laparoscopy  02/28/2012    Procedure: LAPAROSCOPY OPERATIVE;  Surgeon: Osborne Oman, MD;  Location: [facility];  Service: Gynecology;  Laterality: N/A;  . Salpingoophorectomy  02/28/2012    Procedure: SALPINGO OOPHORECTOMY;  Surgeon: Osborne Oman, MD;  Location: [facility];  Service: Gynecology;  Laterality: Left;  . Laparoscopic  lysis of adhesions  02/28/2012    Procedure: LAPAROSCOPIC LYSIS OF ADHESIONS;  Surgeon: Osborne Oman, MD;  Location: [facility];  Service: Gynecology;  Laterality: N/A;  . Hernia repair  November 2014    Family History        Family Status  Relation Status Death Age  . Mother Alive   . Father Alive   . Paternal Uncle Alive         Her family history includes Diabetes in her paternal uncle; Hypertension in her father and mother.    Allergies  Allergen Reactions  . Dust Mite Extract   . Pollen Extract     from trees and weeds    Previous Medications   ALBUTEROL SULFATE (PROAIR RESPICLICK) 123XX123 (90 BASE) MCG/ACT AEPB    Inhale 2 sprays into the lungs as needed.   CETIRIZINE (ZYRTEC) 10 MG TABLET    Take 10 mg by mouth daily.   FLUTICASONE (FLONASE) 50 MCG/ACT NASAL SPRAY    Place 2 sprays into both nostrils daily. Reported on 03/30/2015   FLUTICASONE (FLOVENT DISKUS) 50 MCG/BLIST DISKUS INHALER    Inhale 1 puff into the lungs 2 (two) times daily.   FLUTICASONE-SALMETEROL (ADVAIR) 100-50 MCG/DOSE AEPB    Inhale 1 puff into the lungs 2 (two) times daily.   IBUPROFEN (ADVIL,MOTRIN) 600 MG TABLET    Take 1 tablet (600 mg total) by mouth every 6 (six) hours as needed. For pain   METHOCARBAMOL (ROBAXIN) 500 MG TABLET    Take 1 tablet by mouth 2 (two) times daily. Reported on 03/30/2015   MULTIPLE VITAMIN (MULTIVITAMIN WITH MINERALS) TABS    Take 1 tablet by mouth daily.   TRAMADOL (ULTRAM) 50 MG TABLET    Take 1 tablet by mouth every 6 (six) hours as needed. Reported on 03/30/2015    Patient Care Team: Margarita Rana, MD as PCP - General (Family Medicine)     Objective:   Vitals: BP 130/80 mmHg  Pulse 78  Temp(Src) 98.5 F (36.9 C) (Oral)  Resp 16  Ht 5\' 5"  (1.651 m)  Wt 195 lb (88.451 kg)  BMI 32.45 kg/m2  SpO2 97%   Physical Exam  Constitutional: She is oriented to person, place, and time. She appears well-developed and well-nourished.  HENT:  Head: Normocephalic and  atraumatic.  Right Ear: Tympanic membrane, external ear and ear canal normal.  Left Ear: Tympanic membrane, external ear and ear canal normal.  Nose: Rhinorrhea present.  Mouth/Throat: Uvula is midline, oropharynx is clear and moist and mucous membranes are normal.  Eyes: Conjunctivae, EOM and lids are normal. Pupils are equal, round, and reactive to light.  Neck: Trachea normal and normal range of motion. Neck supple. Carotid bruit is not present. No thyroid mass and no thyromegaly present.  Cardiovascular: Normal rate, regular rhythm and normal heart sounds.   Pulmonary/Chest: Effort normal and breath sounds normal.  Abdominal: Soft.  Normal appearance and bowel sounds are normal. There is no hepatosplenomegaly. There is no tenderness.  Musculoskeletal: Normal range of motion.  Lymphadenopathy:    She has no cervical adenopathy.    She has no axillary adenopathy.  Neurological: She is alert and oriented to person, place, and time. She has normal strength and normal reflexes. No cranial nerve deficit.  Skin: Skin is warm, dry and intact.  Psychiatric: She has a normal mood and affect. Her speech is normal and behavior is normal. Judgment and thought content normal. Cognition and memory are normal.     Depression Screen PHQ 2/9 Scores 03/30/2015  PHQ - 2 Score 0      Assessment & Plan:     Routine Health Maintenance and Physical Exam  Exercise Activities and Dietary recommendations Goals    . Exercise 150 minutes per week (moderate activity)           1. Annual physical exam Stable. Patient advised to continue eating healthy and exercise daily.  2. Subclinical hypothyroidism - T4 AND TSH  3. Need for influenza vaccination - Flu Vaccine QUAD 36+ mos IM     Patient seen and examined by Dr. Jerrell Belfast, and note scribed by Philbert Riser. Dimas, CMA.  I have reviewed the document for accuracy and completeness and I agree with above. Jerrell Belfast, MD   Margarita Rana, MD    --------------------------------------------------------------------

## 2015-03-31 ENCOUNTER — Telehealth: Payer: Self-pay

## 2015-03-31 LAB — T4 AND TSH
T4, Total: 5.7 ug/dL (ref 4.5–12.0)
TSH: 5.21 u[IU]/mL — ABNORMAL HIGH (ref 0.450–4.500)

## 2015-03-31 NOTE — Telephone Encounter (Signed)
Pt advised.   Thanks,   -laura

## 2015-03-31 NOTE — Telephone Encounter (Signed)
LMTCB 03/31/2015  Thanks,   -Mickel Baas

## 2015-03-31 NOTE — Telephone Encounter (Signed)
-----   Message from Margarita Rana, MD sent at 03/31/2015 10:19 AM EST ----- Thyroid improved. Thanks.

## 2015-04-14 DIAGNOSIS — Z96652 Presence of left artificial knee joint: Secondary | ICD-10-CM

## 2015-04-14 HISTORY — DX: Presence of left artificial knee joint: Z96.652

## 2015-04-19 DIAGNOSIS — Z96642 Presence of left artificial hip joint: Secondary | ICD-10-CM | POA: Insufficient documentation

## 2015-04-19 DIAGNOSIS — Z96649 Presence of unspecified artificial hip joint: Secondary | ICD-10-CM

## 2015-04-19 HISTORY — PX: TOTAL HIP ARTHROPLASTY: SHX124

## 2015-04-19 HISTORY — DX: Presence of unspecified artificial hip joint: Z96.649

## 2015-05-06 DIAGNOSIS — J45909 Unspecified asthma, uncomplicated: Secondary | ICD-10-CM | POA: Diagnosis not present

## 2015-05-06 DIAGNOSIS — E782 Mixed hyperlipidemia: Secondary | ICD-10-CM | POA: Diagnosis not present

## 2015-05-06 DIAGNOSIS — M1991 Primary osteoarthritis, unspecified site: Secondary | ICD-10-CM | POA: Diagnosis not present

## 2015-05-06 DIAGNOSIS — Z471 Aftercare following joint replacement surgery: Secondary | ICD-10-CM | POA: Diagnosis not present

## 2015-07-19 ENCOUNTER — Ambulatory Visit (INDEPENDENT_AMBULATORY_CARE_PROVIDER_SITE_OTHER): Payer: BC Managed Care – PPO | Admitting: Family Medicine

## 2015-07-19 ENCOUNTER — Encounter: Payer: Self-pay | Admitting: Family Medicine

## 2015-07-19 VITALS — BP 133/87 | HR 82 | Resp 16 | Ht 65.0 in | Wt 185.0 lb

## 2015-07-19 DIAGNOSIS — Z1151 Encounter for screening for human papillomavirus (HPV): Secondary | ICD-10-CM

## 2015-07-19 DIAGNOSIS — Z1239 Encounter for other screening for malignant neoplasm of breast: Secondary | ICD-10-CM

## 2015-07-19 DIAGNOSIS — Z01419 Encounter for gynecological examination (general) (routine) without abnormal findings: Secondary | ICD-10-CM

## 2015-07-19 DIAGNOSIS — Z124 Encounter for screening for malignant neoplasm of cervix: Secondary | ICD-10-CM

## 2015-07-19 DIAGNOSIS — Z23 Encounter for immunization: Secondary | ICD-10-CM

## 2015-07-19 NOTE — Patient Instructions (Signed)
Preventive Care for Adults, Female A healthy lifestyle and preventive care can promote health and wellness. Preventive health guidelines for women include the following key practices.  A routine yearly physical is a good way to check with your health care provider about your health and preventive screening. It is a chance to share any concerns and updates on your health and to receive a thorough exam.  Visit your dentist for a routine exam and preventive care every 6 months. Brush your teeth twice a day and floss once a day. Good oral hygiene prevents tooth decay and gum disease.  The frequency of eye exams is based on your age, health, family medical history, use of contact lenses, and other factors. Follow your health care provider's recommendations for frequency of eye exams.  Eat a healthy diet. Foods like vegetables, fruits, whole grains, low-fat dairy products, and lean protein foods contain the nutrients you need without too many calories. Decrease your intake of foods high in solid fats, added sugars, and salt. Eat the right amount of calories for you.Get information about a proper diet from your health care provider, if necessary.  Regular physical exercise is one of the most important things you can do for your health. Most adults should get at least 150 minutes of moderate-intensity exercise (any activity that increases your heart rate and causes you to sweat) each week. In addition, most adults need muscle-strengthening exercises on 2 or more days a week.  Maintain a healthy weight. The body mass index (BMI) is a screening tool to identify possible weight problems. It provides an estimate of body fat based on height and weight. Your health care provider can find your BMI and can help you achieve or maintain a healthy weight.For adults 20 years and older:  A BMI below 18.5 is considered underweight.  A BMI of 18.5 to 24.9 is normal.  A BMI of 25 to 29.9 is considered overweight.  A  BMI of 30 and above is considered obese.  Maintain normal blood lipids and cholesterol levels by exercising and minimizing your intake of saturated fat. Eat a balanced diet with plenty of fruit and vegetables. Blood tests for lipids and cholesterol should begin at age 45 and be repeated every 5 years. If your lipid or cholesterol levels are high, you are over 50, or you are at high risk for heart disease, you may need your cholesterol levels checked more frequently.Ongoing high lipid and cholesterol levels should be treated with medicines if diet and exercise are not working.  If you smoke, find out from your health care provider how to quit. If you do not use tobacco, do not start.  Lung cancer screening is recommended for adults aged 45-80 years who are at high risk for developing lung cancer because of a history of smoking. A yearly low-dose CT scan of the lungs is recommended for people who have at least a 30-pack-year history of smoking and are a current smoker or have quit within the past 15 years. A pack year of smoking is smoking an average of 1 pack of cigarettes a day for 1 year (for example: 1 pack a day for 30 years or 2 packs a day for 15 years). Yearly screening should continue until the smoker has stopped smoking for at least 15 years. Yearly screening should be stopped for people who develop a health problem that would prevent them from having lung cancer treatment.  If you are pregnant, do not drink alcohol. If you are  breastfeeding, be very cautious about drinking alcohol. If you are not pregnant and choose to drink alcohol, do not have more than 1 drink per day. One drink is considered to be 12 ounces (355 mL) of beer, 5 ounces (148 mL) of wine, or 1.5 ounces (44 mL) of liquor.  Avoid use of street drugs. Do not share needles with anyone. Ask for help if you need support or instructions about stopping the use of drugs.  High blood pressure causes heart disease and increases the risk  of stroke. Your blood pressure should be checked at least every 1 to 2 years. Ongoing high blood pressure should be treated with medicines if weight loss and exercise do not work.  If you are 55-79 years old, ask your health care provider if you should take aspirin to prevent strokes.  Diabetes screening is done by taking a blood sample to check your blood glucose level after you have not eaten for a certain period of time (fasting). If you are not overweight and you do not have risk factors for diabetes, you should be screened once every 3 years starting at age 45. If you are overweight or obese and you are 40-70 years of age, you should be screened for diabetes every year as part of your cardiovascular risk assessment.  Breast cancer screening is essential preventive care for women. You should practice "breast self-awareness." This means understanding the normal appearance and feel of your breasts and may include breast self-examination. Any changes detected, no matter how small, should be reported to a health care provider. Women in their 20s and 30s should have a clinical breast exam (CBE) by a health care provider as part of a regular health exam every 1 to 3 years. After age 40, women should have a CBE every year. Starting at age 40, women should consider having a mammogram (breast X-ray test) every year. Women who have a family history of breast cancer should talk to their health care provider about genetic screening. Women at a high risk of breast cancer should talk to their health care providers about having an MRI and a mammogram every year.  Breast cancer gene (BRCA)-related cancer risk assessment is recommended for women who have family members with BRCA-related cancers. BRCA-related cancers include breast, ovarian, tubal, and peritoneal cancers. Having family members with these cancers may be associated with an increased risk for harmful changes (mutations) in the breast cancer genes BRCA1 and  BRCA2. Results of the assessment will determine the need for genetic counseling and BRCA1 and BRCA2 testing.  Your health care provider may recommend that you be screened regularly for cancer of the pelvic organs (ovaries, uterus, and vagina). This screening involves a pelvic examination, including checking for microscopic changes to the surface of your cervix (Pap test). You may be encouraged to have this screening done every 3 years, beginning at age 21.  For women ages 30-65, health care providers may recommend pelvic exams and Pap testing every 3 years, or they may recommend the Pap and pelvic exam, combined with testing for human papilloma virus (HPV), every 5 years. Some types of HPV increase your risk of cervical cancer. Testing for HPV may also be done on women of any age with unclear Pap test results.  Other health care providers may not recommend any screening for nonpregnant women who are considered low risk for pelvic cancer and who do not have symptoms. Ask your health care provider if a screening pelvic exam is right for   you.  If you have had past treatment for cervical cancer or a condition that could lead to cancer, you need Pap tests and screening for cancer for at least 20 years after your treatment. If Pap tests have been discontinued, your risk factors (such as having a new sexual partner) need to be reassessed to determine if screening should resume. Some women have medical problems that increase the chance of getting cervical cancer. In these cases, your health care provider may recommend more frequent screening and Pap tests.  Colorectal cancer can be detected and often prevented. Most routine colorectal cancer screening begins at the age of 50 years and continues through age 75 years. However, your health care provider may recommend screening at an earlier age if you have risk factors for colon cancer. On a yearly basis, your health care provider may provide home test kits to check  for hidden blood in the stool. Use of a small camera at the end of a tube, to directly examine the colon (sigmoidoscopy or colonoscopy), can detect the earliest forms of colorectal cancer. Talk to your health care provider about this at age 50, when routine screening begins. Direct exam of the colon should be repeated every 5-10 years through age 75 years, unless early forms of precancerous polyps or small growths are found.  People who are at an increased risk for hepatitis B should be screened for this virus. You are considered at high risk for hepatitis B if:  You were born in a country where hepatitis B occurs often. Talk with your health care provider about which countries are considered high risk.  Your parents were born in a high-risk country and you have not received a shot to protect against hepatitis B (hepatitis B vaccine).  You have HIV or AIDS.  You use needles to inject street drugs.  You live with, or have sex with, someone who has hepatitis B.  You get hemodialysis treatment.  You take certain medicines for conditions like cancer, organ transplantation, and autoimmune conditions.  Hepatitis C blood testing is recommended for all people born from 1945 through 1965 and any individual with known risks for hepatitis C.  Practice safe sex. Use condoms and avoid high-risk sexual practices to reduce the spread of sexually transmitted infections (STIs). STIs include gonorrhea, chlamydia, syphilis, trichomonas, herpes, HPV, and human immunodeficiency virus (HIV). Herpes, HIV, and HPV are viral illnesses that have no cure. They can result in disability, cancer, and death.  You should be screened for sexually transmitted illnesses (STIs) including gonorrhea and chlamydia if:  You are sexually active and are younger than 24 years.  You are older than 24 years and your health care provider tells you that you are at risk for this type of infection.  Your sexual activity has changed  since you were last screened and you are at an increased risk for chlamydia or gonorrhea. Ask your health care provider if you are at risk.  If you are at risk of being infected with HIV, it is recommended that you take a prescription medicine daily to prevent HIV infection. This is called preexposure prophylaxis (PrEP). You are considered at risk if:  You are sexually active and do not regularly use condoms or know the HIV status of your partner(s).  You take drugs by injection.  You are sexually active with a partner who has HIV.  Talk with your health care provider about whether you are at high risk of being infected with HIV. If   you choose to begin PrEP, you should first be tested for HIV. You should then be tested every 3 months for as long as you are taking PrEP.  Osteoporosis is a disease in which the bones lose minerals and strength with aging. This can result in serious bone fractures or breaks. The risk of osteoporosis can be identified using a bone density scan. Women ages 67 years and over and women at risk for fractures or osteoporosis should discuss screening with their health care providers. Ask your health care provider whether you should take a calcium supplement or vitamin D to reduce the rate of osteoporosis.  Menopause can be associated with physical symptoms and risks. Hormone replacement therapy is available to decrease symptoms and risks. You should talk to your health care provider about whether hormone replacement therapy is right for you.  Use sunscreen. Apply sunscreen liberally and repeatedly throughout the day. You should seek shade when your shadow is shorter than you. Protect yourself by wearing long sleeves, pants, a wide-brimmed hat, and sunglasses year round, whenever you are outdoors.  Once a month, do a whole body skin exam, using a mirror to look at the skin on your back. Tell your health care provider of new moles, moles that have irregular borders, moles that  are larger than a pencil eraser, or moles that have changed in shape or color.  Stay current with required vaccines (immunizations).  Influenza vaccine. All adults should be immunized every year.  Tetanus, diphtheria, and acellular pertussis (Td, Tdap) vaccine. Pregnant women should receive 1 dose of Tdap vaccine during each pregnancy. The dose should be obtained regardless of the length of time since the last dose. Immunization is preferred during the 27th-36th week of gestation. An adult who has not previously received Tdap or who does not know her vaccine status should receive 1 dose of Tdap. This initial dose should be followed by tetanus and diphtheria toxoids (Td) booster doses every 10 years. Adults with an unknown or incomplete history of completing a 3-dose immunization series with Td-containing vaccines should begin or complete a primary immunization series including a Tdap dose. Adults should receive a Td booster every 10 years.  Varicella vaccine. An adult without evidence of immunity to varicella should receive 2 doses or a second dose if she has previously received 1 dose. Pregnant females who do not have evidence of immunity should receive the first dose after pregnancy. This first dose should be obtained before leaving the health care facility. The second dose should be obtained 4-8 weeks after the first dose.  Human papillomavirus (HPV) vaccine. Females aged 13-26 years who have not received the vaccine previously should obtain the 3-dose series. The vaccine is not recommended for use in pregnant females. However, pregnancy testing is not needed before receiving a dose. If a female is found to be pregnant after receiving a dose, no treatment is needed. In that case, the remaining doses should be delayed until after the pregnancy. Immunization is recommended for any person with an immunocompromised condition through the age of 61 years if she did not get any or all doses earlier. During the  3-dose series, the second dose should be obtained 4-8 weeks after the first dose. The third dose should be obtained 24 weeks after the first dose and 16 weeks after the second dose.  Zoster vaccine. One dose is recommended for adults aged 30 years or older unless certain conditions are present.  Measles, mumps, and rubella (MMR) vaccine. Adults born  before 1957 generally are considered immune to measles and mumps. Adults born in 1957 or later should have 1 or more doses of MMR vaccine unless there is a contraindication to the vaccine or there is laboratory evidence of immunity to each of the three diseases. A routine second dose of MMR vaccine should be obtained at least 28 days after the first dose for students attending postsecondary schools, health care workers, or international travelers. People who received inactivated measles vaccine or an unknown type of measles vaccine during 1963-1967 should receive 2 doses of MMR vaccine. People who received inactivated mumps vaccine or an unknown type of mumps vaccine before 1979 and are at high risk for mumps infection should consider immunization with 2 doses of MMR vaccine. For females of childbearing age, rubella immunity should be determined. If there is no evidence of immunity, females who are not pregnant should be vaccinated. If there is no evidence of immunity, females who are pregnant should delay immunization until after pregnancy. Unvaccinated health care workers born before 1957 who lack laboratory evidence of measles, mumps, or rubella immunity or laboratory confirmation of disease should consider measles and mumps immunization with 2 doses of MMR vaccine or rubella immunization with 1 dose of MMR vaccine.  Pneumococcal 13-valent conjugate (PCV13) vaccine. When indicated, a person who is uncertain of his immunization history and has no record of immunization should receive the PCV13 vaccine. All adults 65 years of age and older should receive this  vaccine. An adult aged 19 years or older who has certain medical conditions and has not been previously immunized should receive 1 dose of PCV13 vaccine. This PCV13 should be followed with a dose of pneumococcal polysaccharide (PPSV23) vaccine. Adults who are at high risk for pneumococcal disease should obtain the PPSV23 vaccine at least 8 weeks after the dose of PCV13 vaccine. Adults older than 57 years of age who have normal immune system function should obtain the PPSV23 vaccine dose at least 1 year after the dose of PCV13 vaccine.  Pneumococcal polysaccharide (PPSV23) vaccine. When PCV13 is also indicated, PCV13 should be obtained first. All adults aged 65 years and older should be immunized. An adult younger than age 65 years who has certain medical conditions should be immunized. Any person who resides in a nursing home or long-term care facility should be immunized. An adult smoker should be immunized. People with an immunocompromised condition and certain other conditions should receive both PCV13 and PPSV23 vaccines. People with human immunodeficiency virus (HIV) infection should be immunized as soon as possible after diagnosis. Immunization during chemotherapy or radiation therapy should be avoided. Routine use of PPSV23 vaccine is not recommended for American Indians, [place] Natives, or people younger than 65 years unless there are medical conditions that require PPSV23 vaccine. When indicated, people who have unknown immunization and have no record of immunization should receive PPSV23 vaccine. One-time revaccination 5 years after the first dose of PPSV23 is recommended for people aged 19-64 years who have chronic kidney failure, nephrotic syndrome, asplenia, or immunocompromised conditions. People who received 1-2 doses of PPSV23 before age 65 years should receive another dose of PPSV23 vaccine at age 65 years or later if at least 5 years have passed since the previous dose. Doses of PPSV23 are not  needed for people immunized with PPSV23 at or after age 65 years.  Meningococcal vaccine. Adults with asplenia or persistent complement component deficiencies should receive 2 doses of quadrivalent meningococcal conjugate (MenACWY-D) vaccine. The doses should be obtained   at least 2 months apart. Microbiologists working with certain meningococcal bacteria, [place] recruits, people at risk during an outbreak, and people who travel to or live in countries with a high rate of meningitis should be immunized. A first-year college student up through age 34 years who is living in a residence hall should receive a dose if she did not receive a dose on or after her 16th birthday. Adults who have certain high-risk conditions should receive one or more doses of vaccine.  Hepatitis A vaccine. Adults who wish to be protected from this disease, have certain high-risk conditions, work with hepatitis A-infected animals, work in hepatitis A research labs, or travel to or work in countries with a high rate of hepatitis A should be immunized. Adults who were previously unvaccinated and who anticipate close contact with an international adoptee during the first 60 days after arrival in the [place] from a country with a high rate of hepatitis A should be immunized.  Hepatitis B vaccine. Adults who wish to be protected from this disease, have certain high-risk conditions, may be exposed to blood or other infectious body fluids, are household contacts or sex partners of hepatitis B positive people, are clients or workers in certain care facilities, or travel to or work in countries with a high rate of hepatitis B should be immunized.  Haemophilus influenzae type b (Hib) vaccine. A previously unvaccinated person with asplenia or sickle cell disease or having a scheduled splenectomy should receive 1 dose of Hib vaccine. Regardless of previous immunization, a recipient of a hematopoietic stem cell transplant should receive a  3-dose series 6-12 months after her successful transplant. Hib vaccine is not recommended for adults with HIV infection. Preventive Services / Frequency Ages 35 to 4 years  Blood pressure check.** / Every 3-5 years.  Lipid and cholesterol check.** / Every 5 years beginning at age 60.  Clinical breast exam.** / Every 3 years for women in their 71s and 10s.  BRCA-related cancer risk assessment.** / For women who have family members with a BRCA-related cancer (breast, ovarian, tubal, or peritoneal cancers).  Pap test.** / Every 2 years from ages 76 through 26. Every 3 years starting at age 61 through age 76 or 93 with a history of 3 consecutive normal Pap tests.  HPV screening.** / Every 3 years from ages 37 through ages 60 to 51 with a history of 3 consecutive normal Pap tests.  Hepatitis C blood test.** / For any individual with known risks for hepatitis C.  Skin self-exam. / Monthly.  Influenza vaccine. / Every year.  Tetanus, diphtheria, and acellular pertussis (Tdap, Td) vaccine.** / Consult your health care provider. Pregnant women should receive 1 dose of Tdap vaccine during each pregnancy. 1 dose of Td every 10 years.  Varicella vaccine.** / Consult your health care provider. Pregnant females who do not have evidence of immunity should receive the first dose after pregnancy.  HPV vaccine. / 3 doses over 6 months, if 93 and younger. The vaccine is not recommended for use in pregnant females. However, pregnancy testing is not needed before receiving a dose.  Measles, mumps, rubella (MMR) vaccine.** / You need at least 1 dose of MMR if you were born in 1957 or later. You may also need a 2nd dose. For females of childbearing age, rubella immunity should be determined. If there is no evidence of immunity, females who are not pregnant should be vaccinated. If there is no evidence of immunity, females who are  pregnant should delay immunization until after pregnancy.  Pneumococcal  13-valent conjugate (PCV13) vaccine.** / Consult your health care provider.  Pneumococcal polysaccharide (PPSV23) vaccine.** / 1 to 2 doses if you smoke cigarettes or if you have certain conditions.  Meningococcal vaccine.** / 1 dose if you are age 68 to 8 years and a Market researcher living in a residence hall, or have one of several medical conditions, you need to get vaccinated against meningococcal disease. You may also need additional booster doses.  Hepatitis A vaccine.** / Consult your health care provider.  Hepatitis B vaccine.** / Consult your health care provider.  Haemophilus influenzae type b (Hib) vaccine.** / Consult your health care provider. Ages 7 to 53 years  Blood pressure check.** / Every year.  Lipid and cholesterol check.** / Every 5 years beginning at age 25 years.  Lung cancer screening. / Every year if you are aged 11-80 years and have a 30-pack-year history of smoking and currently smoke or have quit within the past 15 years. Yearly screening is stopped once you have quit smoking for at least 15 years or develop a health problem that would prevent you from having lung cancer treatment.  Clinical breast exam.** / Every year after age 48 years.  BRCA-related cancer risk assessment.** / For women who have family members with a BRCA-related cancer (breast, ovarian, tubal, or peritoneal cancers).  Mammogram.** / Every year beginning at age 41 years and continuing for as long as you are in good health. Consult with your health care provider.  Pap test.** / Every 3 years starting at age 65 years through age 37 or 70 years with a history of 3 consecutive normal Pap tests.  HPV screening.** / Every 3 years from ages 72 years through ages 60 to 40 years with a history of 3 consecutive normal Pap tests.  Fecal occult blood test (FOBT) of stool. / Every year beginning at age 21 years and continuing until age 5 years. You may not need to do this test if you get  a colonoscopy every 10 years.  Flexible sigmoidoscopy or colonoscopy.** / Every 5 years for a flexible sigmoidoscopy or every 10 years for a colonoscopy beginning at age 35 years and continuing until age 48 years.  Hepatitis C blood test.** / For all people born from [birth year] through 1965 and any individual with known risks for hepatitis C.  Skin self-exam. / Monthly.  Influenza vaccine. / Every year.  Tetanus, diphtheria, and acellular pertussis (Tdap/Td) vaccine.** / Consult your health care provider. Pregnant women should receive 1 dose of Tdap vaccine during each pregnancy. 1 dose of Td every 10 years.  Varicella vaccine.** / Consult your health care provider. Pregnant females who do not have evidence of immunity should receive the first dose after pregnancy.  Zoster vaccine.** / 1 dose for adults aged 30 years or older.  Measles, mumps, rubella (MMR) vaccine.** / You need at least 1 dose of MMR if you were born in 1957 or later. You may also need a second dose. For females of childbearing age, rubella immunity should be determined. If there is no evidence of immunity, females who are not pregnant should be vaccinated. If there is no evidence of immunity, females who are pregnant should delay immunization until after pregnancy.  Pneumococcal 13-valent conjugate (PCV13) vaccine.** / Consult your health care provider.  Pneumococcal polysaccharide (PPSV23) vaccine.** / 1 to 2 doses if you smoke cigarettes or if you have certain conditions.  Meningococcal vaccine.** /  Consult your health care provider.  Hepatitis A vaccine.** / Consult your health care provider.  Hepatitis B vaccine.** / Consult your health care provider.  Haemophilus influenzae type b (Hib) vaccine.** / Consult your health care provider. Ages 64 years and over  Blood pressure check.** / Every year.  Lipid and cholesterol check.** / Every 5 years beginning at age 23 years.  Lung cancer screening. / Every year if you  are aged 16-80 years and have a 30-pack-year history of smoking and currently smoke or have quit within the past 15 years. Yearly screening is stopped once you have quit smoking for at least 15 years or develop a health problem that would prevent you from having lung cancer treatment.  Clinical breast exam.** / Every year after age 74 years.  BRCA-related cancer risk assessment.** / For women who have family members with a BRCA-related cancer (breast, ovarian, tubal, or peritoneal cancers).  Mammogram.** / Every year beginning at age 44 years and continuing for as long as you are in good health. Consult with your health care provider.  Pap test.** / Every 3 years starting at age 58 years through age 22 or 39 years with 3 consecutive normal Pap tests. Testing can be stopped between 65 and 70 years with 3 consecutive normal Pap tests and no abnormal Pap or HPV tests in the past 10 years.  HPV screening.** / Every 3 years from ages 64 years through ages 70 or 61 years with a history of 3 consecutive normal Pap tests. Testing can be stopped between 65 and 70 years with 3 consecutive normal Pap tests and no abnormal Pap or HPV tests in the past 10 years.  Fecal occult blood test (FOBT) of stool. / Every year beginning at age 40 years and continuing until age 27 years. You may not need to do this test if you get a colonoscopy every 10 years.  Flexible sigmoidoscopy or colonoscopy.** / Every 5 years for a flexible sigmoidoscopy or every 10 years for a colonoscopy beginning at age 7 years and continuing until age 32 years.  Hepatitis C blood test.** / For all people born from [birth year] through 1965 and any individual with known risks for hepatitis C.  Osteoporosis screening.** / A one-time screening for women ages 30 years and over and women at risk for fractures or osteoporosis.  Skin self-exam. / Monthly.  Influenza vaccine. / Every year.  Tetanus, diphtheria, and acellular pertussis (Tdap/Td)  vaccine.** / 1 dose of Td every 10 years.  Varicella vaccine.** / Consult your health care provider.  Zoster vaccine.** / 1 dose for adults aged 35 years or older.  Pneumococcal 13-valent conjugate (PCV13) vaccine.** / Consult your health care provider.  Pneumococcal polysaccharide (PPSV23) vaccine.** / 1 dose for all adults aged 46 years and older.  Meningococcal vaccine.** / Consult your health care provider.  Hepatitis A vaccine.** / Consult your health care provider.  Hepatitis B vaccine.** / Consult your health care provider.  Haemophilus influenzae type b (Hib) vaccine.** / Consult your health care provider. ** Family history and personal history of risk and conditions may change your health care provider's recommendations.   This information is not intended to replace advice given to you by your health care provider. Make sure you discuss any questions you have with your health care provider.   Document Released: 04/25/2001 Document Revised: 03/20/2014 Document Reviewed: 07/25/2010 Elsevier Interactive Patient Education [employer].

## 2015-07-19 NOTE — Progress Notes (Signed)
  Subjective:     Danielle Duarte is a 55 y.o. female and is here for a comprehensive physical exam. The patient reports problems - recent hip replacement.  Social History   Social History  . Marital Status: Divorced    Spouse Name: N/A  . Number of Children: N/A  . Years of Education: N/A   Occupational History  . Not on file.   Social History Main Topics  . Smoking status: Former Smoker    Quit date: 03/13/1987  . Smokeless tobacco: Never Used  . Alcohol Use: No  . Drug Use: No  . Sexual Activity: No   Other Topics Concern  . Not on file   Social History Narrative   Health Maintenance  Topic Date Due  . Hepatitis C Screening  [birth year]  . HIV Screening  10/10/1973  . TETANUS/TDAP  10/10/1977  . INFLUENZA VACCINE  10/12/2015  . PAP SMEAR  09/10/2016  . MAMMOGRAM  09/20/2016  . COLONOSCOPY  10/09/2020    The following portions of the patient's history were reviewed and updated as appropriate: allergies, current medications, past family history, past medical history, past social history, past surgical history and problem list.  Review of Systems Pertinent items noted in HPI and remainder of comprehensive ROS otherwise negative.   Objective:    BP 133/87 mmHg  Pulse 82  Resp 16  Ht 5\' 5"  (1.651 m)  Wt 185 lb (83.915 kg)  BMI 30.79 kg/m2  LMP 11/23/2011 General appearance: alert, cooperative and appears stated age Head: Normocephalic, without obvious abnormality, atraumatic Neck: no adenopathy, supple, symmetrical, trachea midline and thyroid not enlarged, symmetric, no tenderness/mass/nodules Lungs: clear to auscultation bilaterally Breasts: normal appearance, no masses or tenderness Heart: regular rate and rhythm, S1, S2 normal, no murmur, click, rub or gallop Abdomen: soft, non-tender; bowel sounds normal; no masses,  no organomegaly Pelvic: cervix normal in appearance, external genitalia normal, no adnexal masses or tenderness, no cervical motion  tenderness, uterus normal size, shape, and consistency, vagina normal without discharge and small inclusion cysts noted Extremities: extremities normal, atraumatic, no cyanosis or edema Pulses: 2+ and symmetric Skin: Skin color, texture, turgor normal. No rashes or lesions Lymph nodes: Cervical, supraclavicular, and axillary nodes normal. Neurologic: Grossly normal    Assessment:    Healthy female exam.      Plan:     1. Screening for malignant neoplasm of cervix  - Cytology - PAP  2. Breast screening  - MM DIGITAL SCREENING BILATERAL; Future  3. Encounter for routine gynecological examination Routine screening and advice given - Tdap vaccine greater than or equal to 7yo IM  See After Visit Summary for Counseling Recommendations

## 2015-07-20 LAB — CYTOLOGY - PAP

## 2015-07-26 ENCOUNTER — Ambulatory Visit: Payer: BC Managed Care – PPO | Admitting: Obstetrics & Gynecology

## 2015-07-31 ENCOUNTER — Ambulatory Visit (INDEPENDENT_AMBULATORY_CARE_PROVIDER_SITE_OTHER): Payer: BC Managed Care – PPO | Admitting: Physician Assistant

## 2015-07-31 VITALS — BP 132/64 | HR 80 | Temp 97.5°F | Resp 14 | Wt 184.0 lb

## 2015-07-31 DIAGNOSIS — R05 Cough: Secondary | ICD-10-CM

## 2015-07-31 DIAGNOSIS — R059 Cough, unspecified: Secondary | ICD-10-CM

## 2015-07-31 DIAGNOSIS — J0120 Acute ethmoidal sinusitis, unspecified: Secondary | ICD-10-CM

## 2015-07-31 MED ORDER — HYDROCODONE-HOMATROPINE 5-1.5 MG/5ML PO SYRP: 5.0000 mL | ORAL_SOLUTION | Freq: Three times a day (TID) | ORAL | Status: DC | PRN

## 2015-07-31 MED ORDER — AZITHROMYCIN 250 MG PO TABS: ORAL_TABLET | ORAL | Status: DC

## 2015-07-31 NOTE — Progress Notes (Signed)
Patient ID: Danielle Duarte, female   DOB: 1960/05/02, 55 y.o.   MRN: [id number]       Patient: Danielle Duarte Female    DOB: 12/11/1960   55 y.o.   MRN: [id number] Visit Date: 07/31/2015  Today's Provider: Mar Daring, PA-C   Chief Complaint  Patient presents with  . Sinus Problem  . Asthma   Subjective:    HPI  Patient started to develop issues with sinuses x 1 week. Symptoms present are laryngitis, post nasal drip, ears stopped up, nasal congestion, head congestion, cough-more severe with laying down, headache, sinus pressure. No fever, no body aches, no chills. Patient states she does have Asthma-she uses Proair RespiClick as needed, Zyrtec and Advair.    Allergies  Allergen Reactions  . Dust Mite Extract   . Pollen Extract     from trees and weeds   Previous Medications   ALBUTEROL SULFATE (PROAIR RESPICLICK) 123XX123 (90 BASE) MCG/ACT AEPB    Inhale 2 sprays into the lungs as needed.   CETIRIZINE (ZYRTEC) 10 MG TABLET    Take 10 mg by mouth daily.   FLUTICASONE-SALMETEROL (ADVAIR) 100-50 MCG/DOSE AEPB    Inhale 1 puff into the lungs 2 (two) times daily.   IBUPROFEN (ADVIL,MOTRIN) 600 MG TABLET    Take 1 tablet (600 mg total) by mouth every 6 (six) hours as needed. For pain   MULTIPLE VITAMIN (MULTIVITAMIN WITH MINERALS) TABS    Take 1 tablet by mouth daily.    Review of Systems  Constitutional: Positive for fatigue. Negative for fever.  HENT: Positive for congestion, ear pain, postnasal drip, rhinorrhea, sinus pressure and voice change. Negative for sneezing.   Respiratory: Positive for cough, chest tightness and shortness of breath.   Cardiovascular: Negative.   Gastrointestinal: Negative for nausea, vomiting and abdominal pain.  Neurological: Positive for headaches. Negative for dizziness.    Social History  Substance Use Topics  . Smoking status: Former Smoker    Quit date: 03/13/1987  . Smokeless tobacco: Never Used  . Alcohol Use: No   Objective:   BP  132/64 mmHg  Pulse 80  Temp(Src) 97.5 F (36.4 C)  Resp 14  Wt 184 lb (83.462 kg)  SpO2 98%  LMP 11/23/2011  Physical Exam  Constitutional: She appears well-developed and well-nourished. No distress.  HENT:  Head: Normocephalic and atraumatic.  Right Ear: Hearing, external ear and ear canal normal. Tympanic membrane is not erythematous and not bulging. A middle ear effusion is present.  Left Ear: Hearing, external ear and ear canal normal. Tympanic membrane is not erythematous and not bulging. A middle ear effusion is present.  Nose: Mucosal edema and rhinorrhea present. Right sinus exhibits maxillary sinus tenderness. Right sinus exhibits no frontal sinus tenderness. Left sinus exhibits maxillary sinus tenderness. Left sinus exhibits no frontal sinus tenderness.  Mouth/Throat: Uvula is midline and mucous membranes are normal. Posterior oropharyngeal erythema present. No oropharyngeal exudate or posterior oropharyngeal edema.  Most tender over ethmoid sinuses  Neck: Normal range of motion. Neck supple. No tracheal deviation present. No thyromegaly present.  Cardiovascular: Normal rate, regular rhythm and normal heart sounds.  Exam reveals no gallop and no friction rub.   No murmur heard. Pulmonary/Chest: Effort normal and breath sounds normal. No stridor. No respiratory distress. She has no wheezes. She has no rales.  Lymphadenopathy:    She has no cervical adenopathy.  Skin: She is not diaphoretic.  Vitals reviewed.       Assessment &  Plan:     1. Acute ethmoidal sinusitis, recurrence not specified Worsening symptoms that is not responding to OTC medications. Will treat with a Zpak as below. Continue other allergy and asthma medications. Hycodan cough syrup was given for nighttime cough and patient advised of drowsiness precautions. She is to call if symptoms worsen or do not improve after antibiotic. - azithromycin (ZITHROMAX) 250 MG tablet; Take 2 tablets PO on day one, and one  tablet PO daily thereafter until completed.  Dispense: 6 tablet; Refill: 0  2. Cough See above medical treatment plan. - HYDROcodone-homatropine (HYCODAN) 5-1.5 MG/5ML syrup; Take 5 mLs by mouth every 8 (eight) hours as needed.  Dispense: 180 mL; Refill: 0       Mar Daring, PA-C  [facility]

## 2015-07-31 NOTE — Patient Instructions (Signed)

## 2015-08-28 ENCOUNTER — Encounter: Payer: Self-pay | Admitting: Family Medicine

## 2015-08-28 ENCOUNTER — Ambulatory Visit (INDEPENDENT_AMBULATORY_CARE_PROVIDER_SITE_OTHER): Payer: BC Managed Care – PPO | Admitting: Family Medicine

## 2015-08-28 VITALS — BP 122/78 | HR 78 | Temp 98.2°F | Resp 16 | Wt 182.0 lb

## 2015-08-28 DIAGNOSIS — J4521 Mild intermittent asthma with (acute) exacerbation: Secondary | ICD-10-CM

## 2015-08-28 DIAGNOSIS — J301 Allergic rhinitis due to pollen: Secondary | ICD-10-CM | POA: Diagnosis not present

## 2015-08-28 MED ORDER — BENZONATATE 200 MG PO CAPS: 200.0000 mg | ORAL_CAPSULE | Freq: Two times a day (BID) | ORAL | Status: DC | PRN

## 2015-08-28 MED ORDER — ALBUTEROL SULFATE (2.5 MG/3ML) 0.083% IN NEBU: 2.5000 mg | INHALATION_SOLUTION | Freq: Once | RESPIRATORY_TRACT | Status: DC

## 2015-08-28 MED ORDER — FLUTICASONE PROPIONATE 50 MCG/ACT NA SUSP: 2.0000 | Freq: Every day | NASAL | Status: DC

## 2015-08-28 NOTE — Progress Notes (Signed)
Subjective:    Patient ID: Danielle Duarte, female    DOB: 07/27/1960, 55 y.o.   MRN: [id number]  Cough This is a recurrent problem. The current episode started more than 1 month ago. The problem has been waxing and waning. The cough is non-productive (sometimes productive of yellow sputum). Associated symptoms include ear congestion, ear pain, nasal congestion, postnasal drip, rhinorrhea, a sore throat and shortness of breath. Pertinent negatives include no chest pain, chills, fever, headaches, hemoptysis, sweats, weight loss or wheezing. Treatments tried: ProAir, Advair, OTC cough syrup, Zyrtec, Also tried Z Pak and Hycodan prescribed by [place] on 07/31/2015. The treatment provided moderate (did seem to improve sx, but sx have since returned) relief. Her past medical history is significant for asthma and environmental allergies.      Review of Systems  Constitutional: Negative for fever, chills and weight loss.  HENT: Positive for ear pain, postnasal drip, rhinorrhea and sore throat.   Respiratory: Positive for cough and shortness of breath. Negative for hemoptysis and wheezing.   Cardiovascular: Negative for chest pain.  Allergic/Immunologic: Positive for environmental allergies.  Neurological: Negative for headaches.   BP 122/78 mmHg  Pulse 78  Temp(Src) 98.2 F (36.8 C) (Oral)  Resp 16  Wt 182 lb (82.555 kg)  SpO2 97%  LMP 11/23/2011   Patient Active Problem List   Diagnosis Date Noted  . Allergic rhinitis 03/30/2015  . Allergic asthma 03/30/2015  . Direct hernia 03/30/2015  . Edema extremities 03/30/2015  . 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/30/2015  . Blood in the urine 03/30/2015  . Arthralgia of hip 03/30/2015  . History of palpitations 03/30/2015  . HLD (hyperlipidemia) 03/30/2015  . Gonalgia 03/30/2015  . Adiposity 03/30/2015  . Beat, premature ventricular 03/30/2015  . Sinus infection 03/30/2015  . Nail deformity 03/30/2015  . Degenerative arthritis of hip 01/14/2015  .  Arthritis of knee, degenerative 01/14/2015  . Subclinical hypothyroidism 12/07/2014  . Paroxysmal supraventricular tachycardia ([place]) 01/05/2014  . Breathlessness on exertion 12/16/2013  . Normal cytology on  Pap smear with positive HPV test 06/12/2012  . 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 DUB (dysfunctional uterine bleeding) 12/08/2011   Past Medical History  Diagnosis Date  . Abnormal Pap smear   . Diarrhea   . Fibroids   . Arthritis     right knee  . No pertinent past medical history   . PONV (postoperative nausea and vomiting)   . HLD (hyperlipidemia) 03/30/2015   Current Outpatient Prescriptions on File Prior to Visit  Medication Sig  . cetirizine (ZYRTEC) 10 MG tablet Take 10 mg by mouth daily.  . Fluticasone-Salmeterol (ADVAIR) 100-50 MCG/DOSE AEPB Inhale 1 puff into the lungs 2 (two) times daily.  Marland Kitchen ibuprofen (ADVIL,MOTRIN) 600 MG tablet Take 1 tablet (600 mg total) by mouth every 6 (six) hours as needed. For pain  . Multiple Vitamin (MULTIVITAMIN WITH MINERALS) TABS Take 1 tablet by mouth daily.   No current facility-administered medications on file prior to visit.   Allergies  Allergen Reactions  . Dust Mite Extract   . Pollen Extract     from trees and weeds   Past Surgical History  Procedure Laterality Date  . Knee surgery    . Appendectomy    . Colposcopy  03/01/10  . Colonoscopy  10/10/2010  . Laparoscopy  02/28/2012    Procedure: LAPAROSCOPY OPERATIVE;  Surgeon: Osborne Oman, MD;  Location: [facility];  Service: Gynecology;  Laterality: N/A;  . Salpingoophorectomy  02/28/2012    Procedure: SALPINGO OOPHORECTOMY;  Surgeon: Osborne Oman, MD;  Location: [facility];  Service: Gynecology;  Laterality: Left;  . Laparoscopic lysis of adhesions  02/28/2012    Procedure: LAPAROSCOPIC LYSIS OF ADHESIONS;  Surgeon: Osborne Oman, MD;  Location: [facility];  Service: Gynecology;  Laterality: N/A;  . Hernia repair  November 2014  .  Total hip arthroplasty  04/19/15    Left   Social History   Social History  . Marital Status: Divorced    Spouse Name: N/A  . Number of Children: N/A  . Years of Education: N/A   Occupational History  . Not on file.   Social History Main Topics  . Smoking status: Former Smoker    Quit date: 03/13/1987  . Smokeless tobacco: Never Used  . Alcohol Use: No  . Drug Use: No  . Sexual Activity: No   Other Topics Concern  . Not on file   Social History Narrative   Family History  Problem Relation Age of Onset  . Hypertension Mother   . Hypertension Father   . Diabetes Paternal Uncle      BP 122/78 mmHg  Pulse 78  Temp(Src) 98.2 F (36.8 C) (Oral)  Resp 16  Wt 182 lb (82.555 kg)  SpO2 97%  LMP 11/23/2011   Objective:   Physical Exam  Constitutional: She is oriented to person, place, and time. She appears well-developed and well-nourished. No distress.  HENT:  Head: Normocephalic and atraumatic.  Right Ear: Hearing and external ear normal.  Left Ear: Hearing and external ear normal.  Nose: Nose normal.  Poor transillumination of the right maxillary sinus. No tenderness or redness of membranes.  Eyes: Conjunctivae and lids are normal. Right eye exhibits no discharge. Left eye exhibits no discharge. No scleral icterus.  Neck: Neck supple.  Cardiovascular: Normal rate and regular rhythm.   Pulmonary/Chest: Effort normal. No respiratory distress. She has wheezes.  Slight wheeze without rhonchi or rales.  Musculoskeletal: Normal range of motion.  Lymphadenopathy:    She has no cervical adenopathy.  Neurological: She is alert and oriented to person, place, and time.  Skin: Skin is intact. No lesion and no rash noted.  Psychiatric: She has a normal mood and affect. Her speech is normal and behavior is normal. Thought content normal.      Assessment & Plan:  1. Allergic asthma, mild intermittent, with acute exacerbation Recent flare the past week. No purulent sputum  production or fever. Wheeze mild and responded very well to Albuterol nebulizer treatment. Recommend she restart the Advair and use the ProAir-HFA prn. May use Mucinex-DM prn cough or try Tessalon Perles if no controlled. Recheck if no better in 5 days. - benzonatate (TESSALON) 200 MG capsule; Take 1 capsule (200 mg total) by mouth 2 (two) times daily as needed for cough.  Dispense: 20 capsule; Refill: 0 - albuterol (PROVENTIL) (2.5 MG/3ML) 0.083% nebulizer solution 2.5 mg; Take 3 mLs (2.5 mg total) by nebulization once.  2. Allergic rhinitis due to pollen Some slight stuffiness, post nasal drip with rhinorrhea and scratchy throat over the past month. Should use Zyrtec daily and add Flonase daily. Recheck prn. - fluticasone (FLONASE) 50 MCG/ACT nasal spray; Place 2 sprays into both nostrils daily.  Dispense: 16 g; Refill: 6

## 2015-10-30 ENCOUNTER — Other Ambulatory Visit (HOSPITAL_COMMUNITY): Payer: Self-pay | Admitting: Diagnostic Radiology

## 2015-11-30 ENCOUNTER — Other Ambulatory Visit: Payer: Self-pay | Admitting: Family Medicine

## 2015-11-30 DIAGNOSIS — Z1231 Encounter for screening mammogram for malignant neoplasm of breast: Secondary | ICD-10-CM

## 2015-12-06 ENCOUNTER — Ambulatory Visit
Admission: RE | Admit: 2015-12-06 | Discharge: 2015-12-06 | Disposition: A | Discharge status: Home / Self Care | Payer: BC Managed Care – PPO | Source: Ambulatory Visit | Attending: Family Medicine | Admitting: Family Medicine

## 2015-12-06 DIAGNOSIS — Z1231 Encounter for screening mammogram for malignant neoplasm of breast: Secondary | ICD-10-CM

## 2016-03-30 ENCOUNTER — Encounter: Payer: BC Managed Care – PPO | Admitting: Family Medicine

## 2016-03-31 ENCOUNTER — Ambulatory Visit (INDEPENDENT_AMBULATORY_CARE_PROVIDER_SITE_OTHER): Payer: BC Managed Care – PPO | Admitting: Family Medicine

## 2016-03-31 ENCOUNTER — Encounter: Payer: Self-pay | Admitting: Family Medicine

## 2016-03-31 VITALS — BP 110/70 | HR 77 | Temp 98.4°F | Resp 16 | Ht 65.0 in | Wt 195.0 lb

## 2016-03-31 DIAGNOSIS — Z1159 Encounter for screening for other viral diseases: Secondary | ICD-10-CM

## 2016-03-31 DIAGNOSIS — E038 Other specified hypothyroidism: Secondary | ICD-10-CM

## 2016-03-31 DIAGNOSIS — E039 Hypothyroidism, unspecified: Secondary | ICD-10-CM | POA: Diagnosis not present

## 2016-03-31 DIAGNOSIS — E785 Hyperlipidemia, unspecified: Secondary | ICD-10-CM | POA: Diagnosis not present

## 2016-03-31 DIAGNOSIS — Z0000 Encounter for general adult medical examination without abnormal findings: Secondary | ICD-10-CM

## 2016-03-31 NOTE — Progress Notes (Signed)
Patient: Danielle Duarte, Female    DOB: Mar 17, 1960, 56 y.o.   MRN: [id number] Visit Date: 03/31/2016  Today's Provider: Lelon Huh, MD   Chief Complaint  Patient presents with  . Annual Exam  . Hypothyroidism   Subjective:    Annual physical exam Danielle Duarte is a 56 y.o. female who presents today for health maintenance and complete physical. She feels fairly well. She reports exercising yes/water aerobics . She reports she is sleeping well. She is up to date on gyn and breast exam, followed by Dr. Kennon Rounds. Reports having flu vaccine in October.  ----------------------------------------------------------------    Subclinical hypothyroidism From 03/30/2015-no changes, labs checked. Lab Results  Component Value Date   TSH 5.210 (H) 03/30/2015     Review of Systems  Constitutional: Negative for chills, fatigue and fever.  HENT: Negative for congestion, ear pain, rhinorrhea, sneezing and sore throat.   Eyes: Negative.  Negative for pain and redness.  Respiratory: Negative for cough, shortness of breath and wheezing.   Cardiovascular: Negative for chest pain and leg swelling.  Gastrointestinal: Negative for abdominal pain, blood in stool, constipation, diarrhea and nausea.  Endocrine: Negative for polydipsia and polyphagia.  Genitourinary: Negative.  Negative for dysuria, flank pain, hematuria, pelvic pain, vaginal bleeding and vaginal discharge.  Musculoskeletal: Negative for arthralgias, back pain, gait problem and joint swelling.  Skin: Negative for rash.  Neurological: Negative.  Negative for dizziness, tremors, seizures, weakness, light-headedness, numbness and headaches.  Hematological: Negative for adenopathy.  Psychiatric/Behavioral: Negative.  Negative for behavioral problems, confusion and dysphoric mood. The patient is not nervous/anxious and is not hyperactive.   All other systems reviewed and are negative.   Social History      She  reports that she  quit smoking about 29 years ago. She has never used smokeless tobacco. She reports that she does not drink alcohol or use drugs.       Social History   Social History  . Marital status: Divorced    Spouse name: N/A  . Number of children: N/A  . Years of education: N/A   Social History Main Topics  . Smoking status: Former Smoker    Quit date: 03/13/1987  . Smokeless tobacco: Never Used  . Alcohol use No  . Drug use: No  . Sexual activity: No   Other Topics Concern  . None   Social History Narrative  . None    Past Medical History:  Diagnosis Date  . Abnormal Pap smear   . Arthritis    right knee  . Diarrhea   . Fibroids   . HLD (hyperlipidemia) 03/30/2015  . No pertinent past medical history   . PONV (postoperative nausea and vomiting)      Patient Active Problem List   Diagnosis Date Noted  . Allergic rhinitis 03/30/2015  . Allergic asthma 03/30/2015  . Direct hernia 03/30/2015  . Edema extremities 03/30/2015  . 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/30/2015  . Blood in the urine 03/30/2015  . Arthralgia of hip 03/30/2015  . History of palpitations 03/30/2015  . HLD (hyperlipidemia) 03/30/2015  . Gonalgia 03/30/2015  . Adiposity 03/30/2015  . Beat, premature ventricular 03/30/2015  . Sinus infection 03/30/2015  . Nail deformity 03/30/2015  . Degenerative arthritis of hip 01/14/2015  . Arthritis of knee, degenerative 01/14/2015  . Subclinical hypothyroidism 12/07/2014  . Paroxysmal supraventricular tachycardia ([place]) 01/05/2014  . Breathlessness on exertion 12/16/2013  . Normal cytology on  Pap smear with positive HPV  test 06/12/2012  . 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 DUB (dysfunctional uterine bleeding) 12/08/2011    Past Surgical History:  Procedure Laterality Date  . APPENDECTOMY    . COLONOSCOPY  10/10/2010  . COLPOSCOPY  03/01/10  . HERNIA REPAIR  November 2014  . KNEE SURGERY    . LAPAROSCOPIC LYSIS OF ADHESIONS   02/28/2012   Procedure: LAPAROSCOPIC LYSIS OF ADHESIONS;  Surgeon: Osborne Oman, MD;  Location: [facility];  Service: Gynecology;  Laterality: N/A;  . LAPAROSCOPY  02/28/2012   Procedure: LAPAROSCOPY OPERATIVE;  Surgeon: Osborne Oman, MD;  Location: [facility];  Service: Gynecology;  Laterality: N/A;  . SALPINGOOPHORECTOMY  02/28/2012   Procedure: SALPINGO OOPHORECTOMY;  Surgeon: Osborne Oman, MD;  Location: [facility];  Service: Gynecology;  Laterality: Left;  . TOTAL HIP ARTHROPLASTY  04/19/15   Left    Family History        Family Status  Relation Status  . Mother Alive  . Father Alive  . Paternal Uncle Alive        Her family history includes Diabetes in her paternal uncle; Hypertension in her father and mother.     Allergies  Allergen Reactions  . Dust Mite Extract   . Pollen Extract     from trees and weeds     Current Outpatient Prescriptions:  .  ADVAIR HFA 45-21 MCG/ACT inhaler, , Disp: , Rfl:  .  amoxicillin (AMOXIL) 500 MG capsule, Take 500 mg by mouth as needed., Disp: , Rfl:  .  cetirizine (ZYRTEC) 10 MG tablet, Take 10 mg by mouth daily., Disp: , Rfl:  .  fluticasone (FLONASE) 50 MCG/ACT nasal spray, Place 2 sprays into both nostrils daily., Disp: 16 g, Rfl: 6 .  ibuprofen (ADVIL,MOTRIN) 600 MG tablet, Take 1 tablet (600 mg total) by mouth every 6 (six) hours as needed. For pain, Disp: 30 tablet, Rfl: 2 .  meloxicam (MOBIC) 15 MG tablet, Take 15 mg by mouth daily as needed for pain., Disp: , Rfl:  .  montelukast (SINGULAIR) 10 MG tablet, Take 10 mg by mouth at bedtime., Disp: , Rfl:  .  PROAIR HFA 108 (90 Base) MCG/ACT inhaler, TAKE 2 PUFFS BY MOUTH EVERY 4 TO 6 HOURS AS NEEDED . MUST LAST 90 DAYS PER MD, Disp: , Rfl: 0 .  Multiple Vitamin (MULTIVITAMIN WITH MINERALS) TABS, Take 1 tablet by mouth daily., Disp: , Rfl:   Current Facility-Administered Medications:  .  albuterol (PROVENTIL) (2.5 MG/3ML) 0.083% nebulizer solution 2.5 mg, 2.5 mg, Nebulization, Once,  Margo Common, PA   Patient Care Team: Birdie Sons, MD as PCP - General (Family Medicine)      Objective:   Vitals: BP 110/70 (BP Location: Left Arm, Patient Position: Sitting, Cuff Size: Large)   Pulse 77   Temp 98.4 F (36.9 C) (Oral)   Resp 16   Ht 5\' 5"  (1.651 m)   Wt 195 lb (88.5 kg)   LMP 11/23/2011   SpO2 96%   BMI 32.45 kg/m    Physical Exam   General Appearance:    Alert, cooperative, no distress, appears stated age, overweight  Head:    Normocephalic, without obvious abnormality, atraumatic  Eyes:    PERRL, conjunctiva/corneas clear, EOM's intact, fundi    benign, both eyes       Ears:    Normal TM's and external ear canals, both ears  Nose:   Nares normal, septum midline, mucosa normal,  no drainage   or sinus tenderness  Throat:   Lips, mucosa, and tongue normal; teeth and gums normal  Neck:   Supple, symmetrical, trachea midline, no adenopathy;       thyroid:  No enlargement/tenderness/nodules; no carotid   bruit or JVD  Back:     Symmetric, no curvature, ROM normal, no CVA tenderness  Lungs:     Clear to auscultation bilaterally, respirations unlabored  Chest wall:    No tenderness or deformity  Heart:    Regular rate and rhythm, S1 and S2 normal, no murmur, rub   or gallop  Abdomen:     Soft, non-tender, bowel sounds active all four quadrants,    no masses, no organomegaly  Genitalia:    deferred  Rectal:    deferred  Extremities:   Extremities normal, atraumatic, no cyanosis or edema  Pulses:   2+ and symmetric all extremities  Skin:   Skin color, texture, turgor normal, no rashes or lesions  Lymph nodes:   Cervical, supraclavicular, and axillary nodes normal  Neurologic:   CNII-XII intact. Normal strength, sensation and reflexes      throughout    Depression Screen PHQ 2/9 Scores 03/31/2016 03/30/2015  PHQ - 2 Score 0 0  PHQ- 9 Score 0 -      Assessment & Plan:     Routine Health Maintenance and Physical Exam  Exercise Activities  and Dietary recommendations Goals    . Exercise 150 minutes per week (moderate activity)       Immunization History  Administered Date(s) Administered  . Influenza,inj,Quad PF,36+ Mos 03/30/2015  . Tdap 07/19/2015    Health Maintenance  Topic Date Due  . Hepatitis C Screening  09-09-1960  . HIV Screening  10/10/1973  . INFLUENZA VACCINE  10/12/2015  . MAMMOGRAM  12/05/2017  . PAP SMEAR  07/19/2018  . COLONOSCOPY  10/09/2020  . TETANUS/TDAP  07/18/2025     Discussed health benefits of physical activity, and encouraged her to engage in regular exercise appropriate for her age and condition.    --------------------------------------------------------------------  1. Annual physical exam Overweight. Discussed diet and exercise - Comprehensive metabolic panel  2. Subclinical hypothyroidism  - T4 AND TSH  3. Hyperlipidemia, unspecified hyperlipidemia type  - Lipid panel  4. Need for hepatitis C screening test  - Hepatitis C antibody  The entirety of the information documented in the History of Present Illness, Review of Systems and Physical Exam were personally obtained by me. Portions of this information were initially documented by April M. Sabra Heck, CMA and reviewed by me for thoroughness and accuracy.    Lelon Huh, MD  [facility]

## 2016-04-05 ENCOUNTER — Telehealth: Payer: Self-pay

## 2016-04-05 NOTE — Telephone Encounter (Signed)
-----   Message from Birdie Sons, MD sent at 04/05/2016  8:04 AM EST ----- Cholesterol is high at 269, should be under 269. Needs to reduce saturated fats in diet such as red meats, not quite high enough to require medications.  She is mildly hyPOthyroid. This could make it more difficult to lose weight and can cause high cholesterol. It's kind of borderline, but I would recommend trying low dose of thyroid replacement. If she would like to we can rx levothyroxine 30mcg once a day, #30, rf x 3 and follow up in 3 months.

## 2016-04-05 NOTE — Telephone Encounter (Signed)
Left message to call back  

## 2016-04-06 LAB — COMPREHENSIVE METABOLIC PANEL
ALT: 23 IU/L (ref 0–32)
AST: 18 IU/L (ref 0–40)
Albumin/Globulin Ratio: 1.8 (ref 1.2–2.2)
Albumin: 4.4 g/dL (ref 3.5–5.5)
Alkaline Phosphatase: 111 IU/L (ref 39–117)
BUN/Creatinine Ratio: 18 (ref 9–23)
BUN: 13 mg/dL (ref 6–24)
Bilirubin Total: 0.7 mg/dL (ref 0.0–1.2)
CO2: 24 mmol/L (ref 18–29)
Calcium: 9.4 mg/dL (ref 8.7–10.2)
Chloride: 101 mmol/L (ref 96–106)
Creatinine, Ser: 0.72 mg/dL (ref 0.57–1.00)
GFR calc Af Amer: 108 mL/min/{1.73_m2} (ref >59)
GFR calc non Af Amer: 93 mL/min/{1.73_m2} (ref >59)
Globulin, Total: 2.4 g/dL (ref 1.5–4.5)
Glucose: 87 mg/dL (ref 65–99)
Potassium: 4.4 mmol/L (ref 3.5–5.2)
Sodium: 142 mmol/L (ref 134–144)
Total Protein: 6.8 g/dL (ref 6.0–8.5)

## 2016-04-06 LAB — LIPID PANEL
Chol/HDL Ratio: 4.5 ratio units — ABNORMAL HIGH (ref 0.0–4.4)
Cholesterol, Total: 269 mg/dL — ABNORMAL HIGH (ref 100–199)
HDL: 60 mg/dL (ref >39)
LDL Calculated: 174 mg/dL — ABNORMAL HIGH (ref 0–99)
Triglycerides: 176 mg/dL — ABNORMAL HIGH (ref 0–149)
VLDL Cholesterol Cal: 35 mg/dL (ref 5–40)

## 2016-04-06 LAB — T4 AND TSH
T4, Total: 5.7 ug/dL (ref 4.5–12.0)
TSH: 3.76 u[IU]/mL (ref 0.450–4.500)

## 2016-04-06 LAB — HEPATITIS C ANTIBODY: Hep C Virus Ab: <0.1 s/co ratio (ref 0.0–0.9)

## 2016-04-06 NOTE — Telephone Encounter (Signed)
LMOVM for pt to return call 

## 2016-04-06 NOTE — Telephone Encounter (Signed)
Patient states hat she was notified of results. Patient does not want to take levothyroxine. Patient wants to use diet and exercise to see if she can get her numbers down. Patient wants to get her labs rechecked in a couple of months.

## 2016-04-24 ENCOUNTER — Telehealth: Payer: Self-pay | Admitting: Family Medicine

## 2016-04-24 MED ORDER — LEVOTHYROXINE SODIUM 75 MCG PO TABS: 75.0000 ug | ORAL_TABLET | Freq: Every day | ORAL | 3 refills | Status: DC

## 2016-04-24 NOTE — Telephone Encounter (Signed)
Spoke with patient on the phone who wanted to review over lab report again from 04/04/16. Patient wanted to know more information in regards to levothyroxine and common side effects, advised patient on medication and she was in agreeance to start. I scheduled a follow up appt for patient in 3 months with Dr. Caryn Section on 5/14, and [facility] [address]. Patient states that she spoke with both of Dr. Sabino Snipes nurses and is requesting a handout in regards to diet, she states that nurses had discussed this with her on phone. Please review lab. KW

## 2016-04-24 NOTE — Telephone Encounter (Signed)
[id number] 

## 2016-04-24 NOTE — Telephone Encounter (Signed)
Pt would like a nurse to call back regarding her labs most, recent.  Danielle Duarte

## 2016-04-25 NOTE — Telephone Encounter (Signed)
Diet plan was mailed to pt.

## 2016-04-25 NOTE — Telephone Encounter (Signed)
Can send her printout of 1600 calorie diet.

## 2016-05-10 ENCOUNTER — Telehealth: Payer: Self-pay | Admitting: Family Medicine

## 2016-05-10 DIAGNOSIS — Z6839 Body mass index (BMI) 39.0-39.9, adult: Secondary | ICD-10-CM

## 2016-05-10 DIAGNOSIS — E669 Obesity, unspecified: Secondary | ICD-10-CM

## 2016-05-10 DIAGNOSIS — E785 Hyperlipidemia, unspecified: Secondary | ICD-10-CM

## 2016-05-10 NOTE — Telephone Encounter (Signed)
Do [facility]? I though they just had diabetes classes.

## 2016-05-10 NOTE — Telephone Encounter (Signed)
She can meet with dietician at [facility] to discuss a meal plan to lower cholesterol,weight loss

## 2016-05-10 NOTE — Telephone Encounter (Signed)
Pt is requesting referral to [facility] because last labs showed cholesterol and thyroid results were not normal.She also would like some tips on eating better and weight loss

## 2016-05-10 NOTE — Telephone Encounter (Signed)
Please review-aa 

## 2016-05-11 NOTE — Telephone Encounter (Signed)
Ok, but I don't know how to order this... Do I order 3 hours medical nutrition therapy like we do for diabetics??

## 2016-05-11 NOTE — Telephone Encounter (Signed)
Yes same way

## 2016-05-22 ENCOUNTER — Ambulatory Visit (INDEPENDENT_AMBULATORY_CARE_PROVIDER_SITE_OTHER): Payer: BC Managed Care – PPO | Admitting: Family Medicine

## 2016-05-22 ENCOUNTER — Encounter: Payer: Self-pay | Admitting: Family Medicine

## 2016-05-22 ENCOUNTER — Other Ambulatory Visit: Payer: Self-pay

## 2016-05-22 VITALS — BP 128/82 | HR 88 | Temp 98.3°F | Wt 193.6 lb

## 2016-05-22 DIAGNOSIS — J069 Acute upper respiratory infection, unspecified: Secondary | ICD-10-CM | POA: Diagnosis not present

## 2016-05-22 NOTE — Progress Notes (Signed)
Patient: Danielle Duarte Female    DOB: 1960-09-04   56 y.o.   MRN: [id number] Visit Date: 05/22/2016  Today's Provider: Vernie Murders, PA   Chief Complaint  Patient presents with  . URI   Subjective:    URI   This is a new problem. Episode onset: Friday. The problem has been unchanged. Associated symptoms include coughing. Associated symptoms comments: Hoarseness, green sputum . She has tried antihistamine (Zyrtec) for the symptoms. The treatment provided mild relief.   Patient Active Problem List   Diagnosis Date Noted  . 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 Allergic rhinitis 03/30/2015  . Allergic asthma 03/30/2015  . Direct hernia 03/30/2015  . Edema extremities 03/30/2015  . 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/30/2015  . Blood in the urine 03/30/2015  . Arthralgia of hip 03/30/2015  . History of palpitations 03/30/2015  . HLD (hyperlipidemia) 03/30/2015  . Obesity 03/30/2015  . Beat, premature ventricular 03/30/2015  . Nail deformity 03/30/2015  . Degenerative arthritis of hip 01/14/2015  . Arthritis of knee, degenerative 01/14/2015  . Subclinical hypothyroidism 12/07/2014  . Paroxysmal supraventricular tachycardia ([place]) 01/05/2014  . Breathlessness on exertion 12/16/2013  . Normal cytology on  Pap smear with positive HPV test 06/12/2012  . 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 DUB (dysfunctional uterine bleeding) 12/08/2011   Past Surgical History:  Procedure Laterality Date  . APPENDECTOMY    . COLONOSCOPY  10/10/2010  . COLPOSCOPY  03/01/10  . HERNIA REPAIR  November 2014  . KNEE SURGERY    . LAPAROSCOPIC LYSIS OF ADHESIONS  02/28/2012   Procedure: LAPAROSCOPIC LYSIS OF ADHESIONS;  Surgeon: Osborne Oman, MD;  Location: [facility];  Service: Gynecology;  Laterality: N/A;  . LAPAROSCOPY  02/28/2012   Procedure: LAPAROSCOPY OPERATIVE;  Surgeon: Osborne Oman, MD;  Location: [facility];  Service: Gynecology;  Laterality: N/A;  .  SALPINGOOPHORECTOMY  02/28/2012   Procedure: SALPINGO OOPHORECTOMY;  Surgeon: Osborne Oman, MD;  Location: [facility];  Service: Gynecology;  Laterality: Left;  . TOTAL HIP ARTHROPLASTY  04/19/15   Left   Family History  Problem Relation Age of Onset  . Hypertension Mother   . Hypertension Father   . Diabetes Paternal Uncle    Allergies  Allergen Reactions  . Dust Mite Extract   . Other Other (See Comments)    Dust mites  . Pollen Extract     from trees and weeds     Previous Medications   ADVAIR HFA 45-21 MCG/ACT INHALER       CETIRIZINE (ZYRTEC) 10 MG TABLET    Take 10 mg by mouth daily.   FLUTICASONE (FLONASE) 50 MCG/ACT NASAL SPRAY    Place 2 sprays into both nostrils daily.   IBUPROFEN (ADVIL,MOTRIN) 600 MG TABLET    Take 1 tablet (600 mg total) by mouth every 6 (six) hours as needed. For pain   LEVOTHYROXINE (SYNTHROID, LEVOTHROID) 75 MCG TABLET    Take 1 tablet (75 mcg total) by mouth daily.   MELOXICAM (MOBIC) 15 MG TABLET    Take 15 mg by mouth daily as needed for pain.   MONTELUKAST (SINGULAIR) 10 MG TABLET    Take 10 mg by mouth at bedtime.   MULTIPLE VITAMIN (MULTIVITAMIN WITH MINERALS) TABS    Take 1 tablet by mouth daily.   PROAIR HFA 108 (90 BASE) MCG/ACT INHALER    TAKE 2 PUFFS BY MOUTH EVERY 4 TO 6 HOURS AS NEEDED . MUST LAST  42 DAYS PER MD    Review of Systems  Constitutional: Negative.   HENT: Positive for voice change.   Respiratory: Positive for cough.   Cardiovascular: Negative.     Social History  Substance Use Topics  . Smoking status: Former Smoker    Quit date: 03/13/1987  . Smokeless tobacco: Never Used  . Alcohol use No   Objective:   BP 128/82 (BP Location: Right Arm, Patient Position: Sitting, Cuff Size: Normal)   Pulse 88   Temp 98.3 F (36.8 C) (Oral)   Wt 193 lb 9.6 oz (87.8 kg)   LMP 12/04/2011   SpO2 97%   BMI 32.22 kg/m   Physical Exam  Constitutional: She is oriented to person, place, and time. She appears well-developed  and well-nourished. No distress.  HENT:  Head: Normocephalic and atraumatic.  Right Ear: Hearing and external ear normal.  Left Ear: Hearing and external ear normal.  Nose: Nose normal.  Mouth/Throat: Oropharynx is clear and moist.  Eyes: Conjunctivae and lids are normal. Right eye exhibits no discharge. Left eye exhibits no discharge. No scleral icterus.  Neck: Neck supple.  Cardiovascular: Normal rate and regular rhythm.   Pulmonary/Chest: Effort normal and breath sounds normal. No respiratory distress.  Abdominal: Soft. Bowel sounds are normal.  Musculoskeletal: Normal range of motion.  Lymphadenopathy:    She has no cervical adenopathy.  Neurological: She is alert and oriented to person, place, and time.  Skin: Skin is intact. No lesion and no rash noted.  Psychiatric: She has a normal mood and affect. Her speech is normal and behavior is normal. Thought content normal.      Assessment & Plan:      1. Upper respiratory tract infection, unspecified type Onset with cough, hoarseness, scratchy throat and stopped up ears over the past 3 days. No fever or headache. History of allergic rhinitis and asthma. No wheezing or dyspnea. Continue Zyrtec, Advair and ProAir prn. Add Mucinex-DM, gargle with warm saltwater and may use Advil or Tylenol prn. Recheck if no better in 5 days (may need antibiotic).

## 2016-05-22 NOTE — Patient Instructions (Signed)
Upper Respiratory Infection, Adult Most upper respiratory infections (URIs) are a viral infection of the air passages leading to the lungs. A URI affects the nose, throat, and upper air passages. The most common type of URI is nasopharyngitis and is typically referred to as "the common cold." URIs run their course and usually go away on their own. Most of the time, a URI does not require medical attention, but sometimes a bacterial infection in the upper airways can follow a viral infection. This is called a secondary infection. Sinus and middle ear infections are common types of secondary upper respiratory infections. Bacterial pneumonia can also complicate a URI. A URI can worsen asthma and chronic obstructive pulmonary disease (COPD). Sometimes, these complications can require emergency medical care and may be life threatening. What are the causes? Almost all URIs are caused by viruses. A virus is a type of germ and can spread from one person to another. What increases the risk? You may be at risk for a URI if:  You smoke.  You have chronic heart or lung disease.  You have a weakened defense (immune) system.  You are very young or very old.  You have nasal allergies or asthma.  You work in crowded or poorly ventilated areas.  You work in health care facilities or schools.  What are the signs or symptoms? Symptoms typically develop 2-3 days after you come in contact with a cold virus. Most viral URIs last 7-10 days. However, viral URIs from the influenza virus (flu virus) can last 14-18 days and are typically more severe. Symptoms may include:  Runny or stuffy (congested) nose.  Sneezing.  Cough.  Sore throat.  Headache.  Fatigue.  Fever.  Loss of appetite.  Pain in your forehead, behind your eyes, and over your cheekbones (sinus pain).  Muscle aches.  How is this diagnosed? Your health care provider may diagnose a URI by:  Physical exam.  Tests to check that your  symptoms are not due to another condition such as: ? Strep throat. ? Sinusitis. ? Pneumonia. ? Asthma.  How is this treated? A URI goes away on its own with time. It cannot be cured with medicines, but medicines may be prescribed or recommended to relieve symptoms. Medicines may help:  Reduce your fever.  Reduce your cough.  Relieve nasal congestion.  Follow these instructions at home:  Take medicines only as directed by your health care provider.  Gargle warm saltwater or take cough drops to comfort your throat as directed by your health care provider.  Use a warm mist humidifier or inhale steam from a shower to increase air moisture. This may make it easier to breathe.  Drink enough fluid to keep your urine clear or pale yellow.  Eat soups and other clear broths and maintain good nutrition.  Rest as needed.  Return to work when your temperature has returned to normal or as your health care provider advises. You may need to stay home longer to avoid infecting others. You can also use a face mask and careful hand washing to prevent spread of the virus.  Increase the usage of your inhaler if you have asthma.  Do not use any tobacco products, including cigarettes, chewing tobacco, or electronic cigarettes. If you need help quitting, ask your health care provider. How is this prevented? The best way to protect yourself from getting a cold is to practice good hygiene.  Avoid oral or hand contact with people with cold symptoms.  Wash your   hands often if contact occurs.  There is no clear evidence that vitamin C, vitamin E, echinacea, or exercise reduces the chance of developing a cold. However, it is always recommended to get plenty of rest, exercise, and practice good nutrition. Contact a health care provider if:  You are getting worse rather than better.  Your symptoms are not controlled by medicine.  You have chills.  You have worsening shortness of breath.  You have  brown or red mucus.  You have yellow or brown nasal discharge.  You have pain in your face, especially when you bend forward.  You have a fever.  You have swollen neck glands.  You have pain while swallowing.  You have white areas in the back of your throat. Get help right away if:  You have severe or persistent: ? Headache. ? Ear pain. ? Sinus pain. ? Chest pain.  You have chronic lung disease and any of the following: ? Wheezing. ? Prolonged cough. ? Coughing up blood. ? A change in your usual mucus.  You have a stiff neck.  You have changes in your: ? Vision. ? Hearing. ? Thinking. ? Mood. This information is not intended to replace advice given to you by your health care provider. Make sure you discuss any questions you have with your health care provider. Document Released: 08/23/2000 Document Revised: 10/31/2015 Document Reviewed: 06/04/2013 Elsevier Interactive Patient Education  2017 [employer] Inc.  

## 2016-05-26 ENCOUNTER — Telehealth: Payer: Self-pay | Admitting: Family Medicine

## 2016-05-26 MED ORDER — AMOXICILLIN 875 MG PO TABS: 875.0000 mg | ORAL_TABLET | Freq: Two times a day (BID) | ORAL | 0 refills | Status: DC

## 2016-05-26 NOTE — Telephone Encounter (Signed)
Recommend saltwater gargles 3-4 times a day and send Amoxicillin 875 mg BID #20 to the [facility].

## 2016-05-26 NOTE — Telephone Encounter (Signed)
[facility] Patient advised.  

## 2016-05-26 NOTE — Telephone Encounter (Signed)
Pt states she seen Monday with sinus congestion and cough.  Pt states she is not any better and now has a sore throat and is hoarse.  Pt is requesting a Rx to help with this.  [facility]  CB#[phone]

## 2016-06-09 ENCOUNTER — Encounter: Payer: Self-pay | Admitting: Dietician

## 2016-06-09 ENCOUNTER — Encounter: Payer: BC Managed Care – PPO | Attending: Family Medicine | Admitting: Dietician

## 2016-06-09 VITALS — Ht 64.0 in | Wt 190.0 lb

## 2016-06-09 DIAGNOSIS — E785 Hyperlipidemia, unspecified: Secondary | ICD-10-CM

## 2016-06-09 DIAGNOSIS — Z6833 Body mass index (BMI) 33.0-33.9, adult: Secondary | ICD-10-CM

## 2016-06-09 DIAGNOSIS — E6609 Other obesity due to excess calories: Secondary | ICD-10-CM

## 2016-06-09 NOTE — Patient Instructions (Signed)
   Add some form of exercise/ physical activity over the weekends.   Continue with your healthy food choices and portion control. Especially control portions of starches, while increasing low-carb veggies.   Tracking food intake does help with losing more weight.   Try [employer] protein blends, peanut butter powder as alternate protein.

## 2016-06-09 NOTE — Progress Notes (Signed)
Medical Nutrition Therapy: Visit start time: 1100  end time: 1200  Assessment:  Diagnosis: hyperlipidemia, obesity Past medical history: asthma, knee replacement, hypothyroidism Psychosocial issues/ stress concerns: Patient reports high stress level, and does some stress eating Preferred learning method:  . Visual  Current weight: 190lbs with shoes Height: 5'3" Medications, supplements: reconciled list in medical record  Progress and evaluation: Patient has recently started thyroid medication which she hopes will then help reduce cholesterol. She does not eat much red meat, avoids fried foods.  Reports lactose intolerance symptoms, especially when eating out -- sometimes spices such as ginger also trigger GI symptoms. She has followed weight watchers in the past for weight loss, with success, but is now having more difficulty achieving weight loss despite making lifestyle changes.   Physical activity: water aerobics 1 hour 2x a week. Uses neighborhood pool daily in summer.   Dietary Intake:  Usual eating pattern includes 3 meals and 1-2 snacks per day. Dining out frequency: 3-4 meals per week.  Breakfast: 6-6:30am [place] yogurt + honey bunches of oats cereal Snack: 9:30-10:30am  [employer] or [employer] breakfast bar; pretzels, popcorn Lunch: lean cuisine frozen meal; [place] sandwich no mayo or tuna salad sandwich, sometimes with fruit apple, banana, berries, applesauce, snack size chips; occasionally leftovers from home. Snack: occasional candy or fruit snacks Supper: chicken (if meat)-- broiled, chicken salad, fish if out, trying to avoid fast foods. LIkes cold vegetables such as salads, cole slaw rather than cooked.  Snack: pretzels, occasionally ice cream Beverages: water mostly. Occasional sweet tea or soda, has decreased.   Nutrition Care Education: Topics covered: hyperlipidemia, weight management Basic nutrition: basic food groups, appropriate nutrient balance, appropriate meal  and snack schedule, general nutrition guidelines    Weight control: benefits of weight control, determining reasonable weight loss rate, guidance for 1300kcal daily intake with 50%CHO, 20%protein, and 30%fat; discussed appropriate food portions, benefits of tracking food intake, role of exercise.  Hyperlipidemia: healthy and unhealthy fats, role of fiber, food sources of phytochemicals, plant sterols   Nutritional Diagnosis:  [place]-2.2 Altered nutrition-related laboratory As related to hyperlipidemia.  As evidenced by lab report, patient report. [place]-3.3 Overweight/obesity As related to history of excess calories, inactivity, hypothyroidism.  As evidenced by BMI 33.  Intervention: Instruction as noted above.   Patient is currently following eating pattern that is close to recommended guidelines.    Set goals with direction from patient with expectation that weight loss will be slow.    She will make some additional diet changes, and will increase exercise, particularly after end of school year.   Patient declined scheduling follow-up at this time, but will schedule later if needed.      Education Materials given:  . General diet guidelines for Cholesterol-lowering/ Heart health . Food lists/ Planning A Balanced Meal . Sample meal pattern/ menus . Goals/ instructions  Learner/ who was taught:  . Patient   Level of understanding: Marland Kitchen Verbalizes/ demonstrates competency  Demonstrated degree of understanding via:   Teach back Learning barriers: . None  Willingness to learn/ readiness for change: . Eager, change in progress  Monitoring and Evaluation:  Dietary intake, exercise, cholesterol, and body weight      follow up: prn

## 2016-07-24 ENCOUNTER — Encounter: Payer: Self-pay | Admitting: Family Medicine

## 2016-07-24 ENCOUNTER — Ambulatory Visit (INDEPENDENT_AMBULATORY_CARE_PROVIDER_SITE_OTHER): Payer: BC Managed Care – PPO | Admitting: Family Medicine

## 2016-07-24 VITALS — BP 132/88 | HR 68 | Temp 97.9°F | Resp 16 | Wt 194.0 lb

## 2016-07-24 DIAGNOSIS — E038 Other specified hypothyroidism: Secondary | ICD-10-CM

## 2016-07-24 DIAGNOSIS — E039 Hypothyroidism, unspecified: Secondary | ICD-10-CM | POA: Diagnosis not present

## 2016-07-24 NOTE — Progress Notes (Signed)
Patient: Danielle Duarte Female    DOB: 08-Jul-1960   56 y.o.   MRN: [id number] Visit Date: 07/24/2016  Today's Provider: Lelon Huh, MD   Chief Complaint  Patient presents with  . Hypothyroidism    follow up  . Hyperlipidemia    follow up   Subjective:    HPI Follow up of Hypothyroidism:  Patient was last seen for this problem 4 months ago. Management during that visit includes checking labs which showed patient was mildly hypothyroid. Patient was started on Levothyroxine. Patient reports good compliance with treatment, and fair tolerance. Patient feels that the Levothyroxine Is causing weight gain.  Wt Readings from Last 3 Encounters:  07/24/16 194 lb (88 kg)  06/09/16 190 lb (86.2 kg)  05/22/16 193 lb 9.6 oz (87.8 kg)      Lipid/Cholesterol, Follow-up:   Last seen for this 4 months ago.  Management changes since that visit include counseling patient to reduce saturated fats in her diet.  . Last Lipid Panel:    Component Value Date/Time   CHOL 269 (H) 04/04/2016 0840   TRIG 176 (H) 04/04/2016 0840   HDL 60 04/04/2016 0840   CHOLHDL 4.5 (H) 04/04/2016 0840   CHOLHDL 3.0 05/22/2011 1005   VLDL 13 05/22/2011 1005   LDLCALC 174 (H) 04/04/2016 0840    Risk factors for vascular disease include hypercholesterolemia  She reports good compliance with treatment. She is not having side effects.  Current symptoms include none and have been stable. Weight trend: stable Prior visit with dietician: yes  Current diet: well balanced Current exercise: water aerobics 2 times a week and walking at night  Wt Readings from Last 3 Encounters:  06/09/16 190 lb (86.2 kg)  05/22/16 193 lb 9.6 oz (87.8 kg)  03/31/16 195 lb (88.5 kg)   Has started water aerobics for an hour twice a week. Walking 5-7 das a night.  -------------------------------------------------------------------     Allergies  Allergen Reactions  . Dust Mite Extract   . Other Other (See Comments)    Dust mites  . Pollen Extract     from trees and weeds     Current Outpatient Prescriptions:  .  ADVAIR HFA 45-21 MCG/ACT inhaler, , Disp: , Rfl:  .  cetirizine (ZYRTEC) 10 MG tablet, Take 10 mg by mouth daily., Disp: , Rfl:  .  fluticasone (FLONASE) 50 MCG/ACT nasal spray, Place 2 sprays into both nostrils daily., Disp: 16 g, Rfl: 6 .  ibuprofen (ADVIL,MOTRIN) 600 MG tablet, Take 1 tablet (600 mg total) by mouth every 6 (six) hours as needed. For pain, Disp: 30 tablet, Rfl: 2 .  levothyroxine (SYNTHROID, LEVOTHROID) 75 MCG tablet, Take 1 tablet (75 mcg total) by mouth daily., Disp: 30 tablet, Rfl: 3 .  meloxicam (MOBIC) 15 MG tablet, Take 15 mg by mouth daily as needed for pain., Disp: , Rfl:  .  montelukast (SINGULAIR) 10 MG tablet, Take 10 mg by mouth at bedtime., Disp: , Rfl:  .  Multiple Vitamin (MULTIVITAMIN WITH MINERALS) TABS, Take 1 tablet by mouth daily., Disp: , Rfl:  .  PROAIR HFA 108 (90 Base) MCG/ACT inhaler, TAKE 2 PUFFS BY MOUTH EVERY 4 TO 6 HOURS AS NEEDED . MUST LAST 90 DAYS PER MD, Disp: , Rfl: 0  Current Facility-Administered Medications:  .  albuterol (PROVENTIL) (2.5 MG/3ML) 0.083% nebulizer solution 2.5 mg, 2.5 mg, Nebulization, Once, Chrismon, Dennis E, PA  Review of Systems  Constitutional: Negative for appetite change,  chills, fatigue and fever.  Respiratory: Negative for chest tightness and shortness of breath.   Cardiovascular: Negative for chest pain and palpitations.  Gastrointestinal: Negative for abdominal pain, nausea and vomiting.  Neurological: Negative for dizziness and weakness.    Social History  Substance Use Topics  . Smoking status: Former Smoker    Quit date: 03/13/1987  . Smokeless tobacco: Never Used  . Alcohol use No   Objective:   BP 132/88 (BP Location: Left Arm, Patient Position: Sitting, Cuff Size: Large)   Pulse 68   Temp 97.9 F (36.6 C) (Oral)   Resp 16   Wt 194 lb (88 kg)   LMP 12/04/2011   SpO2 95% Comment: room air   BMI 33.30 kg/m  There were no vitals filed for this visit.   Physical Exam  General Appearance:    Alert, cooperative, no distress, obese  Eyes:    PERRL, conjunctiva/corneas clear, EOM's intact       Lungs:     Clear to auscultation bilaterally, respirations unlabored  Heart:    Regular rate and rhythm  Neurologic:   Awake, alert, oriented x 3. No apparent focal neurological           defect.           Assessment & Plan:     1. Subclinical hypothyroidism Tolerating initiation of levothyroxine well. Still having trouble losing weight.  - T4 AND TSH       Lelon Huh, MD  [facility]

## 2016-07-25 ENCOUNTER — Other Ambulatory Visit: Payer: Self-pay | Admitting: Family Medicine

## 2016-07-25 LAB — T4 AND TSH
T4, Total: 7.5 ug/dL (ref 4.5–12.0)
TSH: 0.766 u[IU]/mL (ref 0.450–4.500)

## 2016-07-25 MED ORDER — LEVOTHYROXINE SODIUM 75 MCG PO TABS: 75.0000 ug | ORAL_TABLET | Freq: Every day | ORAL | 11 refills | Status: DC

## 2016-07-25 NOTE — Telephone Encounter (Signed)
Patient states that she received a message with her lab results and she was told that she needs to stay on the same dose of levothyroxine (SYNTHROID, LEVOTHROID) 75 MCG tablet.  She needs refills sent to [facility].  She states that she needs enough to last until she is seen again in January.

## 2016-08-15 ENCOUNTER — Encounter: Payer: Self-pay | Admitting: Family Medicine

## 2016-08-15 ENCOUNTER — Ambulatory Visit (INDEPENDENT_AMBULATORY_CARE_PROVIDER_SITE_OTHER): Payer: BC Managed Care – PPO | Admitting: Family Medicine

## 2016-08-15 VITALS — BP 122/81 | HR 73 | Wt 185.0 lb

## 2016-08-15 DIAGNOSIS — Z1151 Encounter for screening for human papillomavirus (HPV): Secondary | ICD-10-CM

## 2016-08-15 DIAGNOSIS — Z01419 Encounter for gynecological examination (general) (routine) without abnormal findings: Secondary | ICD-10-CM | POA: Diagnosis not present

## 2016-08-15 DIAGNOSIS — Z124 Encounter for screening for malignant neoplasm of cervix: Secondary | ICD-10-CM

## 2016-08-15 DIAGNOSIS — N907 Vulvar cyst: Secondary | ICD-10-CM | POA: Insufficient documentation

## 2016-08-15 LAB — POCT URINALYSIS DIPSTICK
Bilirubin, UA: NEGATIVE
Blood, UA: NEGATIVE
Glucose, UA: NEGATIVE
Ketones, UA: NEGATIVE
Leukocytes, UA: NEGATIVE
Nitrite, UA: NEGATIVE
Protein, UA: NEGATIVE
Spec Grav, UA: >=1.03 — AB (ref 1.010–1.025)
Urobilinogen, UA: 0.2 E.U./dL
pH, UA: 5 (ref 5.0–8.0)

## 2016-08-15 NOTE — Assessment & Plan Note (Signed)
If patient desires removal will reschedule after the summer is over.

## 2016-08-15 NOTE — Patient Instructions (Signed)
Preventive Care 40-64 Years, Female Preventive care refers to lifestyle choices and visits with your health care provider that can promote health and wellness. What does preventive care include?  A yearly physical exam. This is also called an annual well check.  Dental exams once or twice a year.  Routine eye exams. Ask your health care provider how often you should have your eyes checked.  Personal lifestyle choices, including: ? Daily care of your teeth and gums. ? Regular physical activity. ? Eating a healthy diet. ? Avoiding tobacco and drug use. ? Limiting alcohol use. ? Practicing safe sex. ? Taking low-dose aspirin daily starting at age 58. ? Taking vitamin and mineral supplements as recommended by your health care provider. What happens during an annual well check? The services and screenings done by your health care provider during your annual well check will depend on your age, overall health, lifestyle risk factors, and family history of disease. Counseling Your health care provider may ask you questions about your:  Alcohol use.  Tobacco use.  Drug use.  Emotional well-being.  Home and relationship well-being.  Sexual activity.  Eating habits.  Work and work Statistician.  Method of birth control.  Menstrual cycle.  Pregnancy history.  Screening You may have the following tests or measurements:  Height, weight, and BMI.  Blood pressure.  Lipid and cholesterol levels. These may be checked every 5 years, or more frequently if you are over 81 years old.  Skin check.  Lung cancer screening. You may have this screening every year starting at age 78 if you have a 30-pack-year history of smoking and currently smoke or have quit within the past 15 years.  Fecal occult blood test (FOBT) of the stool. You may have this test every year starting at age 65.  Flexible sigmoidoscopy or colonoscopy. You may have a sigmoidoscopy every 5 years or a colonoscopy  every 10 years starting at age 30.  Hepatitis C blood test.  Hepatitis B blood test.  Sexually transmitted disease (STD) testing.  Diabetes screening. This is done by checking your blood sugar (glucose) after you have not eaten for a while (fasting). You may have this done every 1-3 years.  Mammogram. This may be done every 1-2 years. Talk to your health care provider about when you should start having regular mammograms. This may depend on whether you have a family history of breast cancer.  BRCA-related cancer screening. This may be done if you have a family history of breast, ovarian, tubal, or peritoneal cancers.  Pelvic exam and Pap test. This may be done every 3 years starting at age 80. Starting at age 36, this may be done every 5 years if you have a Pap test in combination with an HPV test.  Bone density scan. This is done to screen for osteoporosis. You may have this scan if you are at high risk for osteoporosis.  Discuss your test results, treatment options, and if necessary, the need for more tests with your health care provider. Vaccines Your health care provider may recommend certain vaccines, such as:  Influenza vaccine. This is recommended every year.  Tetanus, diphtheria, and acellular pertussis (Tdap, Td) vaccine. You may need a Td booster every 10 years.  Varicella vaccine. You may need this if you have not been vaccinated.  Zoster vaccine. You may need this after age 5.  Measles, mumps, and rubella (MMR) vaccine. You may need at least one dose of MMR if you were born in  1959 or later. You may also need a second dose.  Pneumococcal 13-valent conjugate (PCV13) vaccine. You may need this if you have certain conditions and were not previously vaccinated.  Pneumococcal polysaccharide (PPSV23) vaccine. You may need one or two doses if you smoke cigarettes or if you have certain conditions.  Meningococcal vaccine. You may need this if you have certain  conditions.  Hepatitis A vaccine. You may need this if you have certain conditions or if you travel or work in places where you may be exposed to hepatitis A.  Hepatitis B vaccine. You may need this if you have certain conditions or if you travel or work in places where you may be exposed to hepatitis B.  Haemophilus influenzae type b (Hib) vaccine. You may need this if you have certain conditions.  Talk to your health care provider about which screenings and vaccines you need and how often you need them. This information is not intended to replace advice given to you by your health care provider. Make sure you discuss any questions you have with your health care provider. Document Released: 03/26/2015 Document Revised: 11/17/2015 Document Reviewed: 12/29/2014 Elsevier Interactive Patient Education  2017 [employer].

## 2016-08-15 NOTE — Progress Notes (Signed)
  Subjective:     Danielle Duarte is a 56 y.o. female and is here for a comprehensive physical exam. The patient reports problems - labia cyst. Left labial cysts, do not cause pain. Possibly interested in having them removed for cosmetic reasons.  Social History   Social History  . Marital status: Divorced    Spouse name: N/A  . Number of children: N/A  . Years of education: N/A   Occupational History  . Not on file.   Social History Main Topics  . Smoking status: Former Smoker    Quit date: 03/13/1987  . Smokeless tobacco: Never Used  . Alcohol use No  . Drug use: No  . Sexual activity: No   Other Topics Concern  . Not on file   Social History Narrative  . No narrative on file   Health Maintenance  Topic Date Due  . HIV Screening  10/10/1973  . INFLUENZA VACCINE  10/11/2016  . MAMMOGRAM  12/05/2017  . PAP SMEAR  07/19/2018  . COLONOSCOPY  10/09/2020  . TETANUS/TDAP  07/18/2025  . Hepatitis C Screening  Completed    The following portions of the patient's history were reviewed and updated as appropriate: allergies, current medications, past family history, past medical history, past social history, past surgical history and problem list.  Review of Systems Pertinent items noted in HPI and remainder of comprehensive ROS otherwise negative.   Objective:    BP 122/81   Pulse 73   Wt 185 lb (83.9 kg)   LMP 12/04/2011   BMI 31.76 kg/m  General appearance: alert, cooperative and appears stated age Head: Normocephalic, without obvious abnormality, atraumatic Neck: no adenopathy, supple, symmetrical, trachea midline and thyroid not enlarged, symmetric, no tenderness/mass/nodules Lungs: clear to auscultation bilaterally Breasts: normal appearance, no masses or tenderness Heart: regular rate and rhythm, S1, S2 normal, no murmur, click, rub or gallop Abdomen: soft, non-tender; bowel sounds normal; no masses,  no organomegaly Pelvic: cervix normal in appearance, no  adnexal masses or tenderness, no cervical motion tenderness, uterus normal size, shape, and consistency, vagina normal without discharge and mulitple small inclusion bodies in the left labia maajora Extremities: extremities normal, atraumatic, no cyanosis or edema Pulses: 2+ and symmetric Skin: Skin color, texture, turgor normal. No rashes or lesions Lymph nodes: Cervical, supraclavicular, and axillary nodes normal. Neurologic: Grossly normal    Assessment:    Healthy female exam.      Plan:      Problem List Items Addressed This Visit      Unprioritized   Inclusion cyst of vulva    If patient desires removal will reschedule after the summer is over.       Other Visit Diagnoses    Well woman exam with routine gynecological exam    -  Primary   Relevant Orders   POCT Urinalysis Dipstick (Completed)   Screening for malignant neoplasm of cervix       Relevant Orders   Cytology - PAP     PCP does year labs Mammogram due in September. See After Visit Summary for Counseling Recommendations

## 2016-08-15 NOTE — Progress Notes (Signed)
Last pap 07/2015 - normal and negative HPV - Desires pap today Last MM - 11/2015 - Normal - already has order but needs to be scheduled. C/O cyst on left labia - would like eval

## 2016-08-18 LAB — CYTOLOGY - PAP
Diagnosis: NEGATIVE
HPV: NOT DETECTED

## 2016-10-17 ENCOUNTER — Other Ambulatory Visit: Payer: Self-pay | Admitting: Family Medicine

## 2016-11-15 ENCOUNTER — Ambulatory Visit (INDEPENDENT_AMBULATORY_CARE_PROVIDER_SITE_OTHER): Payer: BC Managed Care – PPO | Admitting: Family Medicine

## 2016-11-15 ENCOUNTER — Encounter: Payer: Self-pay | Admitting: Family Medicine

## 2016-11-15 VITALS — BP 120/84 | HR 73 | Temp 98.3°F | Resp 16 | Wt 188.0 lb

## 2016-11-15 DIAGNOSIS — B309 Viral conjunctivitis, unspecified: Secondary | ICD-10-CM

## 2016-11-15 MED ORDER — CIPROFLOXACIN HCL 0.3 % OP SOLN: 1.0000 [drp] | OPHTHALMIC | 0 refills | Status: AC

## 2016-11-15 NOTE — Progress Notes (Signed)
Patient: Danielle Duarte Female    DOB: [birth year]   56 y.o.   MRN: [id number] Visit Date: 11/15/2016  Today's Provider: Lelon Huh, MD   Chief Complaint  Patient presents with  . Conjunctivitis   Subjective:    Patient stated that she woke up to her left swollen and red yesterday. Right eye had discharge this morning. Left eyelid is still red today. Patient has symptoms of itchy eyelids and some discharge in both eye's. .    Conjunctivitis   The current episode started 2 days ago. The problem has been unchanged. Nothing relieves the symptoms. Associated symptoms include double vision, eye itching, headaches, neck pain, eye discharge and eye redness. Pertinent negatives include no orthopnea, no fever, no decreased vision, no photophobia, no abdominal pain, no constipation, no diarrhea, no nausea, no vomiting, no congestion, no ear discharge, no ear pain, no hearing loss, no mouth sores, no rhinorrhea, no sore throat, no stridor, no swollen glands, no muscle aches, no neck stiffness, no cough, no wheezing, no rash and no eye pain.       Allergies  Allergen Reactions  . Dust Mite Extract   . Other Other (See Comments)    Dust mites  . Pollen Extract     from trees and weeds     Current Outpatient Prescriptions:  .  ADVAIR HFA 45-21 MCG/ACT inhaler, , Disp: , Rfl:  .  cetirizine (ZYRTEC) 10 MG tablet, Take 10 mg by mouth daily., Disp: , Rfl:  .  levothyroxine (SYNTHROID, LEVOTHROID) 75 MCG tablet, TAKE 1 TABLET BY MOUTH EVERY DAY, Disp: 30 tablet, Rfl: 11 .  meloxicam (MOBIC) 15 MG tablet, Take 15 mg by mouth daily as needed for pain., Disp: , Rfl:  .  Multiple Vitamin (MULTIVITAMIN WITH MINERALS) TABS, Take 1 tablet by mouth daily., Disp: , Rfl:  .  PROAIR HFA 108 (90 Base) MCG/ACT inhaler, TAKE 2 PUFFS BY MOUTH EVERY 4 TO 6 HOURS AS NEEDED . MUST LAST 90 DAYS PER MD, Disp: , Rfl: 0  Current Facility-Administered Medications:  .  albuterol (PROVENTIL) (2.5 MG/3ML)  0.083% nebulizer solution 2.5 mg, 2.5 mg, Nebulization, Once, Chrismon, Dennis E, PA  Review of Systems  Constitutional: Negative for fever.  HENT: Negative for congestion, ear discharge, ear pain, hearing loss, mouth sores, rhinorrhea and sore throat.   Eyes: Positive for double vision, discharge, redness and itching. Negative for photophobia and pain.  Respiratory: Negative for cough, wheezing and stridor.   Cardiovascular: Negative for orthopnea.  Gastrointestinal: Negative for abdominal pain, constipation, diarrhea, nausea and vomiting.  Musculoskeletal: Positive for neck pain.  Skin: Negative for rash.  Neurological: Positive for headaches.    Social History  Substance Use Topics  . Smoking status: Former Smoker    Quit date: 03/13/1987  . Smokeless tobacco: Never Used  . Alcohol use No   Objective:   BP 120/84 (BP Location: Left Arm, Patient Position: Sitting, Cuff Size: Large)   Pulse 73   Temp 98.3 F (36.8 C) (Oral)   Resp 16   Wt 188 lb (85.3 kg)   LMP 12/04/2011   SpO2 95%   BMI 32.27 kg/m  Vitals:   11/15/16 1624  BP: 120/84  Pulse: 73  Resp: 16  Temp: 98.3 F (36.8 C)  TempSrc: Oral  SpO2: 95%  Weight: 188 lb (85.3 kg)     Physical Exam  General Appearance:    Alert, cooperative, no distress  HENT:  ENT exam normal, no neck nodes or sinus tenderness  Eyes:    PERRL, conjunctiva/corneas clear, EOM's intact                       Assessment & Plan:     1. Acute viral conjunctivitis of left eye Morning symptoms, normal exam now.  Will likely resolve spontaneously. Recommend OTC saline eye drops and message with baby shampoo daily. Given printed prescription ciloxin to fill if worsens or not resolved within 5 days.        Lelon Huh, MD  [facility]

## 2016-11-30 ENCOUNTER — Encounter: Payer: Self-pay | Admitting: Family Medicine

## 2016-11-30 ENCOUNTER — Ambulatory Visit (INDEPENDENT_AMBULATORY_CARE_PROVIDER_SITE_OTHER): Payer: BC Managed Care – PPO | Admitting: Family Medicine

## 2016-11-30 VITALS — BP 114/80 | HR 85 | Temp 98.4°F | Resp 16 | Wt 188.8 lb

## 2016-11-30 DIAGNOSIS — J4530 Mild persistent asthma, uncomplicated: Secondary | ICD-10-CM

## 2016-11-30 DIAGNOSIS — J0100 Acute maxillary sinusitis, unspecified: Secondary | ICD-10-CM

## 2016-11-30 MED ORDER — PREDNISONE 20 MG PO TABS: ORAL_TABLET | ORAL | 0 refills | Status: DC

## 2016-11-30 MED ORDER — AMOXICILLIN-POT CLAVULANATE 875-125 MG PO TABS: 1.0000 | ORAL_TABLET | Freq: Two times a day (BID) | ORAL | 0 refills | Status: DC

## 2016-11-30 NOTE — Progress Notes (Signed)
Subjective:     Patient ID: Danielle Duarte, female   DOB: Nov 02, 1960, 56 y.o.   MRN: [id number]  HPI  Chief Complaint  Patient presents with  . Laryngitis    Patient comes in office today with complaints of laryngitis over the pat 4 days. Patient reports the following; ears popping, cough productive of phlegm ( yellow/green), post nasal drainage, sinus pain and pressure, feeling lethargic and pain under left axillary area.   States sore throat started a few days prior to current sx. Additionally reports maxillary sinus pressure. States she has been compliant with Advair and has used Albuterol sparingly during her course of illness.   Review of Systems     Objective:   Physical Exam  Constitutional: She appears well-developed and well-nourished. No distress.  Ears: T.M's intact without inflammation Throat: tonsils absent without erythema Neck: no cervical adenopathy Lungs: clear     Assessment:    1. 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 Acute non-recurrent maxillary sinusitis - amoxicillin-clavulanate (AUGMENTIN) 875-125 MG tablet; Take 1 tablet by mouth 2 (two) times daily.  Dispense: 20 tablet; Refill: 0    Plan:    Discussed use of Mucinex D and Delsym. Schedule albuterol inhaler while ill. Start prednisone if increased shortness of breath. Work excuse for 9/21 if needed.

## 2016-11-30 NOTE — Patient Instructions (Signed)
Discussed use of Mucinex D for congestion and Delsym for cough. 

## 2016-12-21 ENCOUNTER — Other Ambulatory Visit: Payer: Self-pay | Admitting: Family Medicine

## 2016-12-21 DIAGNOSIS — Z1231 Encounter for screening mammogram for malignant neoplasm of breast: Secondary | ICD-10-CM

## 2017-01-05 ENCOUNTER — Ambulatory Visit (INDEPENDENT_AMBULATORY_CARE_PROVIDER_SITE_OTHER): Payer: BC Managed Care – PPO | Admitting: Physician Assistant

## 2017-01-05 ENCOUNTER — Encounter: Payer: Self-pay | Admitting: Physician Assistant

## 2017-01-05 VITALS — BP 118/70 | HR 74 | Temp 98.2°F | Resp 16 | Wt 189.8 lb

## 2017-01-05 DIAGNOSIS — J40 Bronchitis, not specified as acute or chronic: Secondary | ICD-10-CM | POA: Diagnosis not present

## 2017-01-05 MED ORDER — AZITHROMYCIN 250 MG PO TABS: ORAL_TABLET | ORAL | 0 refills | Status: DC

## 2017-01-05 MED ORDER — LEVALBUTEROL HCL 0.63 MG/3ML IN NEBU: 0.6300 mg | INHALATION_SOLUTION | Freq: Once | RESPIRATORY_TRACT | Status: DC

## 2017-01-05 MED ORDER — PREDNISONE 10 MG PO TABS: ORAL_TABLET | ORAL | 0 refills | Status: DC

## 2017-01-05 NOTE — Patient Instructions (Signed)

## 2017-01-05 NOTE — Progress Notes (Signed)
Patient: Danielle Duarte Female    DOB: March 07, 1961   56 y.o.   MRN: [id number] Visit Date: 01/05/2017  Today's Provider: Mar Daring, PA-C   Chief Complaint  Patient presents with  . Sinusitis   Subjective:    Sinusitis  This is a new problem. The current episode started more than 1 month ago. The problem has been gradually worsening since onset. She is experiencing no pain. Associated symptoms include congestion, coughing, a hoarse voice, sinus pressure and sneezing. Pertinent negatives include no chills, headaches, shortness of breath or sore throat. Past treatments include antibiotics. The treatment provided mild relief.      Allergies  Allergen Reactions  . Dust Mite Extract   . Other Other (See Comments)    Dust mites  . Pollen Extract     from trees and weeds     Current Outpatient Prescriptions:  .  ADVAIR HFA 45-21 MCG/ACT inhaler, , Disp: , Rfl:  .  cetirizine (ZYRTEC) 10 MG tablet, Take 10 mg by mouth daily., Disp: , Rfl:  .  levothyroxine (SYNTHROID, LEVOTHROID) 75 MCG tablet, TAKE 1 TABLET BY MOUTH EVERY DAY, Disp: 30 tablet, Rfl: 11 .  meloxicam (MOBIC) 15 MG tablet, Take 15 mg by mouth daily as needed for pain., Disp: , Rfl:  .  Multiple Vitamin (MULTIVITAMIN WITH MINERALS) TABS, Take 1 tablet by mouth daily., Disp: , Rfl:  .  PROAIR HFA 108 (90 Base) MCG/ACT inhaler, TAKE 2 PUFFS BY MOUTH EVERY 4 TO 6 HOURS AS NEEDED . MUST LAST 90 DAYS PER MD, Disp: , Rfl: 0 .  amoxicillin-clavulanate (AUGMENTIN) 875-125 MG tablet, Take 1 tablet by mouth 2 (two) times daily. (Patient not taking: Reported on 01/05/2017), Disp: 20 tablet, Rfl: 0 .  predniSONE (DELTASONE) 20 MG tablet, Take one pill twice daily for 5 days (Patient not taking: Reported on 01/05/2017), Disp: 10 tablet, Rfl: 0  Current Facility-Administered Medications:  .  albuterol (PROVENTIL) (2.5 MG/3ML) 0.083% nebulizer solution 2.5 mg, 2.5 mg, Nebulization, Once, Chrismon, Dennis E, PA  Review  of Systems  Constitutional: Positive for fatigue. Negative for chills and fever.  HENT: Positive for congestion, hoarse voice, postnasal drip, rhinorrhea, sinus pressure and sneezing. Negative for sore throat.   Respiratory: Positive for cough. Negative for chest tightness, shortness of breath and wheezing.   Cardiovascular: Negative for chest pain, palpitations and leg swelling.  Neurological: Negative for headaches.    Social History  Substance Use Topics  . Smoking status: Former Smoker    Quit date: 03/13/1987  . Smokeless tobacco: Never Used  . Alcohol use No   Objective:   BP 118/70 (BP Location: Left Arm, Patient Position: Sitting, Cuff Size: Large)   Pulse 74   Temp 98.2 F (36.8 C) (Oral)   Resp 16   Wt 189 lb 12.8 oz (86.1 kg)   LMP 12/04/2011   SpO2 96%   BMI 32.58 kg/m  Vitals:   01/05/17 1620  BP: 118/70  Pulse: 74  Resp: 16  Temp: 98.2 F (36.8 C)  TempSrc: Oral  SpO2: 96%  Weight: 189 lb 12.8 oz (86.1 kg)     Physical Exam  Constitutional: She appears well-developed and well-nourished. No distress.  HENT:  Head: Normocephalic and atraumatic.  Right Ear: Hearing, tympanic membrane, external ear and ear canal normal.  Left Ear: Hearing, tympanic membrane, external ear and ear canal normal.  Nose: Nose normal.  Mouth/Throat: Uvula is midline, oropharynx is clear and  moist and mucous membranes are normal. No oropharyngeal exudate, posterior oropharyngeal edema or posterior oropharyngeal erythema.  Eyes: Pupils are equal, round, and reactive to light. Conjunctivae are normal. Right eye exhibits no discharge. Left eye exhibits no discharge. No scleral icterus.  Neck: Normal range of motion. Neck supple. No tracheal deviation present. No thyromegaly present.  Cardiovascular: Normal rate, regular rhythm and normal heart sounds.  Exam reveals no gallop and no friction rub.   No murmur heard. Pulmonary/Chest: Effort normal. No stridor. No respiratory distress.  She has decreased breath sounds. She has wheezes (throughout). She has no rhonchi. She has no rales.  Lymphadenopathy:    She has no cervical adenopathy.  Skin: Skin is warm and dry. She is not diaphoretic.  Vitals reviewed.       Assessment & Plan:     1. Bronchitis Nebulizer treatment given today in the office. Lung sounds improved following treatment. Zpak and prednisone taper given to patient. She is to call if symptoms fail to improve or worsen.  - azithromycin (ZITHROMAX) 250 MG tablet; Take 2 tabs PO on day 1 then 1 tab PO daily thereafter  Dispense: 6 tablet; Refill: 0 - predniSONE (DELTASONE) 10 MG tablet; Take 6 tabs PO on day 1&2, 5 tabs PO on day 3&4, 4 tabs PO on day 5&6, 3 tabs PO on day 7&8, 2 tabs PO on day 9&10, 1 tab PO on day 11&12.  Dispense: 42 tablet; Refill: 0 - levalbuterol (XOPENEX) nebulizer solution 0.63 mg; Take 3 mLs (0.63 mg total) by nebulization once.       Mar Daring, PA-C  [facility]

## 2017-01-10 ENCOUNTER — Ambulatory Visit
Admission: RE | Admit: 2017-01-10 | Discharge: 2017-01-10 | Disposition: A | Discharge status: Home / Self Care | Payer: BC Managed Care – PPO | Source: Ambulatory Visit | Attending: Family Medicine | Admitting: Family Medicine

## 2017-01-10 DIAGNOSIS — Z1231 Encounter for screening mammogram for malignant neoplasm of breast: Secondary | ICD-10-CM

## 2017-02-20 ENCOUNTER — Ambulatory Visit (INDEPENDENT_AMBULATORY_CARE_PROVIDER_SITE_OTHER): Payer: BC Managed Care – PPO | Admitting: Family Medicine

## 2017-02-20 DIAGNOSIS — Z23 Encounter for immunization: Secondary | ICD-10-CM | POA: Diagnosis not present

## 2017-03-16 ENCOUNTER — Encounter (INDEPENDENT_AMBULATORY_CARE_PROVIDER_SITE_OTHER): Payer: Self-pay | Admitting: Vascular Surgery

## 2017-03-30 ENCOUNTER — Encounter (INDEPENDENT_AMBULATORY_CARE_PROVIDER_SITE_OTHER): Payer: Self-pay | Admitting: Vascular Surgery

## 2017-04-06 ENCOUNTER — Encounter: Payer: Self-pay | Admitting: Family Medicine

## 2017-04-06 ENCOUNTER — Ambulatory Visit (INDEPENDENT_AMBULATORY_CARE_PROVIDER_SITE_OTHER): Payer: BC Managed Care – PPO | Admitting: Family Medicine

## 2017-04-06 VITALS — BP 128/88 | HR 75 | Temp 98.6°F | Resp 16 | Ht 64.0 in | Wt 197.0 lb

## 2017-04-06 DIAGNOSIS — E039 Hypothyroidism, unspecified: Secondary | ICD-10-CM | POA: Diagnosis not present

## 2017-04-06 DIAGNOSIS — R5383 Other fatigue: Secondary | ICD-10-CM

## 2017-04-06 DIAGNOSIS — Z6833 Body mass index (BMI) 33.0-33.9, adult: Secondary | ICD-10-CM

## 2017-04-06 DIAGNOSIS — E785 Hyperlipidemia, unspecified: Secondary | ICD-10-CM

## 2017-04-06 DIAGNOSIS — Z0000 Encounter for general adult medical examination without abnormal findings: Secondary | ICD-10-CM

## 2017-04-06 DIAGNOSIS — E038 Other specified hypothyroidism: Secondary | ICD-10-CM

## 2017-04-06 DIAGNOSIS — J4530 Mild persistent asthma, uncomplicated: Secondary | ICD-10-CM

## 2017-04-06 NOTE — Progress Notes (Signed)
Patient: Danielle Duarte, Female    DOB: [birth year], 57 y.o.   MRN: [id number] Visit Date: 04/06/2017  Today's Provider: Lelon Huh, MD   Chief Complaint  Patient presents with  . Annual Exam  . Hyperlipidemia    follow up  . Hypothyroidism    follow up   Subjective:    Annual physical exam Danielle Duarte is a 57 y.o. female who presents today for health maintenance and complete physical. She feels fairly well. She reports no regular exercise. She reports she is sleeping fairly well. She states she has been more fatigued for the last several months and would like to have thyroid checked.  -----------------------------------------------------------------  Lipid/Cholesterol, Follow-up:   Last seen for this1 years ago.  Management changes since that visit include counseling patient to decrease saturated fats in her diet. . Last Lipid Panel:    Component Value Date/Time   CHOL 269 (H) 04/04/2016 0840   TRIG 176 (H) 04/04/2016 0840   HDL 60 04/04/2016 0840   CHOLHDL 4.5 (H) 04/04/2016 0840   CHOLHDL 3.0 05/22/2011 1005   VLDL 13 05/22/2011 1005   LDLCALC 174 (H) 04/04/2016 0840    Risk factors for vascular disease include hypercholesterolemia  She reports poor compliance with treatment.Has not been watching her diet. She is not having side effects.  Current symptoms include none and have been stable. Weight trend: increasing steadily Prior visit with dietician: no Current diet: in general, an "unhealthy" diet Current exercise: none  Wt Readings from Last 3 Encounters:  01/05/17 189 lb 12.8 oz (86.1 kg)  11/30/16 188 lb 12.8 oz (85.6 kg)  11/15/16 188 lb (85.3 kg)    ------------------------------------------------------------------- Follow up of Hypothyroidism: Patient was last seen for this problem 8 months ago and no changes were made. Patient reports good compliance with treatment, and good tolerance.  Lab Results  Component Value Date   TSH 2.100  04/09/2017     Review of Systems  Constitutional: Positive for fatigue. Negative for chills and fever.  HENT: Negative for congestion, ear pain, rhinorrhea, sneezing and sore throat.   Eyes: Negative.  Negative for pain and redness.  Respiratory: Negative for cough, shortness of breath and wheezing.   Cardiovascular: Negative for chest pain and leg swelling.  Gastrointestinal: Negative for abdominal pain, blood in stool, constipation, diarrhea and nausea.  Endocrine: Negative for polydipsia and polyphagia.  Genitourinary: Negative.  Negative for dysuria, flank pain, hematuria, pelvic pain, vaginal bleeding and vaginal discharge.  Musculoskeletal: Negative for arthralgias, back pain, gait problem and joint swelling.  Skin: Negative for rash.  Allergic/Immunologic: Positive for environmental allergies.  Neurological: Negative.  Negative for dizziness, tremors, seizures, weakness, light-headedness, numbness and headaches.  Hematological: Negative for adenopathy.  Psychiatric/Behavioral: Negative.  Negative for behavioral problems, confusion and dysphoric mood. The patient is not nervous/anxious and is not hyperactive.     Social History      She  reports that she quit smoking about 30 years ago. she has never used smokeless tobacco. She reports that she does not drink alcohol or use drugs.       Social History   Socioeconomic History  . Marital status: Divorced    Spouse name: None  . Number of children: None  . Years of education: None  . Highest education level: None  Social Needs  . Financial resource strain: None  . Food insecurity - worry: None  . Food insecurity - inability: None  . Transportation needs -  medical: None  . Transportation needs - non-medical: None  Occupational History  . None  Tobacco Use  . Smoking status: Former Smoker    Last attempt to quit: 03/13/1987    Years since quitting: 30.0  . Smokeless tobacco: Never Used  Substance and Sexual Activity  .  Alcohol use: No  . Drug use: No  . Sexual activity: No  Other Topics Concern  . None  Social History Narrative  . None    Past Medical History:  Diagnosis Date  . Abnormal Pap smear   . Arthritis    right knee  . Diarrhea   . Fibroids   . HLD (hyperlipidemia) 03/30/2015  . No pertinent past medical history   . PONV (postoperative nausea and vomiting)      Patient Active Problem List   Diagnosis Date Noted  . 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 Allergic rhinitis 03/30/2015  . Allergic asthma 03/30/2015  . Direct hernia 03/30/2015  . Edema extremities 03/30/2015  . 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/30/2015  . History of palpitations 03/30/2015  . HLD (hyperlipidemia) 03/30/2015  . Obesity 03/30/2015  . Beat, premature ventricular 03/30/2015  . Nail deformity 03/30/2015  . Degenerative arthritis of hip 01/14/2015  . Arthritis of knee, degenerative 01/14/2015  . Subclinical hypothyroidism 12/07/2014  . Paroxysmal supraventricular tachycardia ([place]) 01/05/2014  . Breathlessness on exertion 12/16/2013  . Normal cytology on  Pap smear with positive HPV test 06/12/2012  . 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 DUB (dysfunctional uterine bleeding) 12/08/2011    Past Surgical History:  Procedure Laterality Date  . APPENDECTOMY    . COLONOSCOPY  10/10/2010  . COLPOSCOPY  03/01/10  . HERNIA REPAIR  November 2014  . KNEE SURGERY    . LAPAROSCOPIC LYSIS OF ADHESIONS  02/28/2012   Procedure: LAPAROSCOPIC LYSIS OF ADHESIONS;  Surgeon: Osborne Oman, MD;  Location: [facility];  Service: Gynecology;  Laterality: N/A;  . LAPAROSCOPY  02/28/2012   Procedure: LAPAROSCOPY OPERATIVE;  Surgeon: Osborne Oman, MD;  Location: [facility];  Service: Gynecology;  Laterality: N/A;  . SALPINGOOPHORECTOMY  02/28/2012   Procedure: SALPINGO OOPHORECTOMY;  Surgeon: Osborne Oman, MD;  Location: [facility];  Service: Gynecology;   Laterality: Left;  . TOTAL HIP ARTHROPLASTY  04/19/15   Left    Family History        Family Status  Relation Name Status  . Mother  Alive  . Father  Alive  . Annamarie Major  Alive        Her family history includes Diabetes in her paternal uncle; Hypertension in her father and mother.     Allergies  Allergen Reactions  . Dust Mite Extract   . Other Other (See Comments)    Dust mites  . Pollen Extract     from trees and weeds     Current Outpatient Medications:  .  ADVAIR HFA 45-21 MCG/ACT inhaler, , Disp: , Rfl:  .  Cholecalciferol (VITAMIN D3) 2000 units TABS, Take 1 Dose by mouth daily., Disp: , Rfl:  .  levothyroxine (SYNTHROID, LEVOTHROID) 75 MCG tablet, TAKE 1 TABLET BY MOUTH EVERY DAY, Disp: 30 tablet, Rfl: 11 .  meloxicam (MOBIC) 15 MG tablet, Take 15 mg by mouth daily as needed for pain., Disp: , Rfl:  .  Multiple Vitamin (MULTIVITAMIN WITH MINERALS) TABS, Take 1 tablet by mouth daily., Disp: , Rfl:  .  PROAIR HFA 108 (  90 Base) MCG/ACT inhaler, TAKE 2 PUFFS BY MOUTH EVERY 4 TO 6 HOURS AS NEEDED . MUST LAST 90 DAYS PER MD, Disp: , Rfl: 0 .  cetirizine (ZYRTEC) 10 MG tablet, Take 10 mg by mouth daily., Disp: , Rfl:   Current Facility-Administered Medications:  .  albuterol (PROVENTIL) (2.5 MG/3ML) 0.083% nebulizer solution 2.5 mg, 2.5 mg, Nebulization, Once, Chrismon, Vickki Muff, PA .  levalbuterol (XOPENEX) nebulizer solution 0.63 mg, 0.63 mg, Nebulization, Once, Mar Daring, PA-C   Patient Care Team: Birdie Sons, MD as PCP - General (Family Medicine) Tiajuana Amass, MD as Referring Physician (Allergy and Immunology) Earnestine Leys, MD (Specialist) Donnamae Jude, MD as Consulting Physician (Obstetrics and Gynecology)      Objective:   Vitals: BP 128/88 (BP Location: Left Arm, Patient Position: Sitting, Cuff Size: Large)   Pulse 75   Temp 98.6 F (37 C) (Oral)   Resp 16   Ht 5\' 4"  (1.626 m)   Wt 197 lb (89.4 kg)   LMP 12/04/2011   SpO2 96% Comment:  room air  BMI 33.81 kg/m      Physical Exam   General Appearance:    Alert, cooperative, no distress, appears stated age, overweight  Head:    Normocephalic, without obvious abnormality, atraumatic  Eyes:    PERRL, conjunctiva/corneas clear, EOM's intact, fundi    benign, both eyes  Ears:    Normal TM's and external ear canals, both ears  Nose:   Nares normal, septum midline, mucosa normal, no drainage    or sinus tenderness  Throat:   Lips, mucosa, and tongue normal; teeth and gums normal  Neck:   Supple, symmetrical, trachea midline, no adenopathy;    thyroid:  no enlargement/tenderness/nodules; no carotid   bruit or JVD  Back:     Symmetric, no curvature, ROM normal, no CVA tenderness  Lungs:     Clear to auscultation bilaterally, respirations unlabored  Chest Wall:    No tenderness or deformity   Heart:    Regular rate and rhythm, S1 and S2 normal, no murmur, rub   or gallop  Breast Exam:    deferred  Abdomen:     Soft, non-tender, bowel sounds active all four quadrants,    no masses, no organomegaly  Pelvic:    deferred  Extremities:   Extremities normal, atraumatic, no cyanosis or edema  Pulses:   2+ and symmetric all extremities  Skin:   Skin color, texture, turgor normal, no rashes or lesions  Lymph nodes:   Cervical, supraclavicular, and axillary nodes normal  Neurologic:   CNII-XII intact, normal strength, sensation and reflexes    throughout    Depression Screen PHQ 2/9 Scores 04/06/2017 06/09/2016 03/31/2016 03/30/2015  PHQ - 2 Score 0 0 0 0  PHQ- 9 Score 2 - 0 -      Assessment & Plan:     Routine Health Maintenance and Physical Exam  Exercise Activities and Dietary recommendations Goals    . Exercise 150 minutes per week (moderate activity)       Immunization History  Administered Date(s) Administered  . Influenza,inj,Quad PF,6+ Mos 03/30/2015, 02/20/2017  . Influenza-Unspecified 12/12/2015  . Tdap 07/19/2015    Health Maintenance  Topic Date  Due  . HIV Screening  10/10/1973  . MAMMOGRAM  01/11/2019  . PAP SMEAR  08/16/2019  . COLONOSCOPY  10/09/2020  . TETANUS/TDAP  07/18/2025  . INFLUENZA VACCINE  Completed  . Hepatitis C Screening  Completed  Discussed health benefits of physical activity, and encouraged her to engage in regular exercise appropriate for her age and condition.    -------------------------------------------------------------------- 1. Annual physical exam Generally doing well. Pap/pelvic/breast deferred to gyn  - Lipid panel - Comprehensive metabolic panel - TSH - CBC  2. Subclinical hypothyroidism  - TSH  3. Hyperlipidemia, unspecified hyperlipidemia type Working on low saturated fat diet.  - Lipid panel - Comprehensive metabolic panel  4. BMI 33.0-33.9,adult Counseled regarding prudent diet and regular exercise.    5. 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 Other fatigue  - Comprehensive metabolic panel - TSH - CBC    Lelon Huh, MD  [facility]

## 2017-04-06 NOTE — Patient Instructions (Signed)
It is recommended to engage in 150 minutes of moderate exercise every week.    Preventive Care 40-64 Years, Female Preventive care refers to lifestyle choices and visits with your health care provider that can promote health and wellness. What does preventive care include?  A yearly physical exam. This is also called an annual well check.  Dental exams once or twice a year.  Routine eye exams. Ask your health care provider how often you should have your eyes checked.  Personal lifestyle choices, including: ? Daily care of your teeth and gums. ? Regular physical activity. ? Eating a healthy diet. ? Avoiding tobacco and drug use. ? Limiting alcohol use. ? Practicing safe sex. ? Taking low-dose aspirin daily starting at age 97. ? Taking vitamin and mineral supplements as recommended by your health care provider. What happens during an annual well check? The services and screenings done by your health care provider during your annual well check will depend on your age, overall health, lifestyle risk factors, and family history of disease. Counseling Your health care provider may ask you questions about your:  Alcohol use.  Tobacco use.  Drug use.  Emotional well-being.  Home and relationship well-being.  Sexual activity.  Eating habits.  Work and work Statistician.  Method of birth control.  Menstrual cycle.  Pregnancy history.  Screening You may have the following tests or measurements:  Height, weight, and BMI.  Blood pressure.  Lipid and cholesterol levels. These may be checked every 5 years, or more frequently if you are over 42 years old.  Skin check.  Lung cancer screening. You may have this screening every year starting at age 53 if you have a 30-pack-year history of smoking and currently smoke or have quit within the past 15 years.  Fecal occult blood test (FOBT) of the stool. You may have this test every year starting at age 76.  Flexible  sigmoidoscopy or colonoscopy. You may have a sigmoidoscopy every 5 years or a colonoscopy every 10 years starting at age 56.  Hepatitis C blood test.  Hepatitis B blood test.  Sexually transmitted disease (STD) testing.  Diabetes screening. This is done by checking your blood sugar (glucose) after you have not eaten for a while (fasting). You may have this done every 1-3 years.  Mammogram. This may be done every 1-2 years. Talk to your health care provider about when you should start having regular mammograms. This may depend on whether you have a family history of breast cancer.  BRCA-related cancer screening. This may be done if you have a family history of breast, ovarian, tubal, or peritoneal cancers.  Pelvic exam and Pap test. This may be done every 3 years starting at age 2. Starting at age 38, this may be done every 5 years if you have a Pap test in combination with an HPV test.  Bone density scan. This is done to screen for osteoporosis. You may have this scan if you are at high risk for osteoporosis.  Discuss your test results, treatment options, and if necessary, the need for more tests with your health care provider. Vaccines Your health care provider may recommend certain vaccines, such as:  Influenza vaccine. This is recommended every year.  Tetanus, diphtheria, and acellular pertussis (Tdap, Td) vaccine. You may need a Td booster every 10 years.  Varicella vaccine. You may need this if you have not been vaccinated.  Zoster vaccine. You may need this after age 67.  Measles, mumps, and rubella (  MMR) vaccine. You may need at least one dose of MMR if you were born in 1957 or later. You may also need a second dose.  Pneumococcal 13-valent conjugate (PCV13) vaccine. You may need this if you have certain conditions and were not previously vaccinated.  Pneumococcal polysaccharide (PPSV23) vaccine. You may need one or two doses if you smoke cigarettes or if you have certain  conditions.  Meningococcal vaccine. You may need this if you have certain conditions.  Hepatitis A vaccine. You may need this if you have certain conditions or if you travel or work in places where you may be exposed to hepatitis A.  Hepatitis B vaccine. You may need this if you have certain conditions or if you travel or work in places where you may be exposed to hepatitis B.  Haemophilus influenzae type b (Hib) vaccine. You may need this if you have certain conditions.  Talk to your health care provider about which screenings and vaccines you need and how often you need them. This information is not intended to replace advice given to you by your health care provider. Make sure you discuss any questions you have with your health care provider. Document Released: 03/26/2015 Document Revised: 11/17/2015 Document Reviewed: 12/29/2014 Elsevier Interactive Patient Education  [employer].

## 2017-04-10 LAB — COMPREHENSIVE METABOLIC PANEL
ALT: 23 IU/L (ref 0–32)
AST: 22 IU/L (ref 0–40)
Albumin/Globulin Ratio: 2 (ref 1.2–2.2)
Albumin: 4.5 g/dL (ref 3.5–5.5)
Alkaline Phosphatase: 102 IU/L (ref 39–117)
BUN/Creatinine Ratio: 19 (ref 9–23)
BUN: 15 mg/dL (ref 6–24)
Bilirubin Total: 0.7 mg/dL (ref 0.0–1.2)
CO2: 24 mmol/L (ref 20–29)
Calcium: 9.3 mg/dL (ref 8.7–10.2)
Chloride: 102 mmol/L (ref 96–106)
Creatinine, Ser: 0.78 mg/dL (ref 0.57–1.00)
GFR calc Af Amer: 97 mL/min/{1.73_m2} (ref >59)
GFR calc non Af Amer: 84 mL/min/{1.73_m2} (ref >59)
Globulin, Total: 2.2 g/dL (ref 1.5–4.5)
Glucose: 86 mg/dL (ref 65–99)
Potassium: 4 mmol/L (ref 3.5–5.2)
Sodium: 143 mmol/L (ref 134–144)
Total Protein: 6.7 g/dL (ref 6.0–8.5)

## 2017-04-10 LAB — LIPID PANEL
Chol/HDL Ratio: 3.5 ratio (ref 0.0–4.4)
Cholesterol, Total: 243 mg/dL — ABNORMAL HIGH (ref 100–199)
HDL: 70 mg/dL (ref >39)
LDL Calculated: 145 mg/dL — ABNORMAL HIGH (ref 0–99)
Triglycerides: 139 mg/dL (ref 0–149)
VLDL Cholesterol Cal: 28 mg/dL (ref 5–40)

## 2017-04-10 LAB — TSH: TSH: 2.1 u[IU]/mL (ref 0.450–4.500)

## 2017-04-10 LAB — CBC
Hematocrit: 42.5 % (ref 34.0–46.6)
Hemoglobin: 14.3 g/dL (ref 11.1–15.9)
MCH: 29.5 pg (ref 26.6–33.0)
MCHC: 33.6 g/dL (ref 31.5–35.7)
MCV: 88 fL (ref 79–97)
Platelets: 296 10*3/uL (ref 150–379)
RBC: 4.84 x10E6/uL (ref 3.77–5.28)
RDW: 14.3 % (ref 12.3–15.4)
WBC: 8.4 10*3/uL (ref 3.4–10.8)

## 2017-04-17 ENCOUNTER — Ambulatory Visit (INDEPENDENT_AMBULATORY_CARE_PROVIDER_SITE_OTHER): Payer: BC Managed Care – PPO | Admitting: Vascular Surgery

## 2017-04-17 ENCOUNTER — Encounter (INDEPENDENT_AMBULATORY_CARE_PROVIDER_SITE_OTHER): Payer: Self-pay | Admitting: Vascular Surgery

## 2017-04-17 VITALS — BP 150/87 | HR 70 | Resp 16 | Ht 64.5 in | Wt 195.0 lb

## 2017-04-17 DIAGNOSIS — E785 Hyperlipidemia, unspecified: Secondary | ICD-10-CM

## 2017-04-17 DIAGNOSIS — M1612 Unilateral primary osteoarthritis, left hip: Secondary | ICD-10-CM

## 2017-04-17 DIAGNOSIS — I83893 Varicose veins of bilateral lower extremities with other complications: Secondary | ICD-10-CM | POA: Diagnosis not present

## 2017-04-17 DIAGNOSIS — M7989 Other specified soft tissue disorders: Secondary | ICD-10-CM

## 2017-04-17 NOTE — Progress Notes (Signed)
Patient ID: Danielle Duarte, female   DOB: 08-Jul-1960, 57 y.o.   MRN: [id number]  Chief Complaint  Patient presents with  . New Patient (Initial Visit)    Varicose Veins    HPI Danielle Duarte is a 57 y.o. female.  I am asked to see the patient by Dr. Nehemiah Massed for evaluation of varicose veins with pain and swelling.  The patient presents with complaints of symptomatic varicosities of the lower extremities. The patient reports a long standing history of varicosities and they have become painful over time. There was no clear inciting event or causative factor that started the symptoms although she has had a hip replacement and multiple other less severe orthopedic issues in both legs.  The left leg is more severly affected. The patient elevates the legs for relief. The pain is described as heaviness and aching. The symptoms are generally most severe in the evening, particularly when they have been on their feet for long periods of time.  Anti-inflammatories have been used to try to improve the symptoms with limited success. The patient complains of increasing swelling as an associated symptom. The patient has no previous history of deep venous thrombosis or superficial thrombophlebitis to their knowledge.     Past Medical History:  Diagnosis Date  . Abnormal Pap smear   . Arthritis    right knee  . Fibroids   . PONV (postoperative nausea and vomiting)     Past Surgical History:  Procedure Laterality Date  . APPENDECTOMY    . COLONOSCOPY  10/10/2010  . COLPOSCOPY  03/01/10  . HERNIA REPAIR  November 2014  . KNEE SURGERY    . LAPAROSCOPIC LYSIS OF ADHESIONS  02/28/2012   Procedure: LAPAROSCOPIC LYSIS OF ADHESIONS;  Surgeon: Osborne Oman, MD;  Location: [facility];  Service: Gynecology;  Laterality: N/A;  . LAPAROSCOPY  02/28/2012   Procedure: LAPAROSCOPY OPERATIVE;  Surgeon: Osborne Oman, MD;  Location: [facility];  Service: Gynecology;  Laterality: N/A;  . SALPINGOOPHORECTOMY   02/28/2012   Procedure: SALPINGO OOPHORECTOMY;  Surgeon: Osborne Oman, MD;  Location: [facility];  Service: Gynecology;  Laterality: Left;  . TOTAL HIP ARTHROPLASTY  04/19/15   Left    Family History  Problem Relation Age of Onset  . Hypertension Mother   . Hypertension Father   . Diabetes Paternal Uncle   No bleeding or clotting disorders   Social History Social History   Tobacco Use  . Smoking status: Former Smoker    Last attempt to quit: 03/13/1987    Years since quitting: 30.1  . Smokeless tobacco: Never Used  Substance Use Topics  . Alcohol use: No  . Drug use: No    Allergies  Allergen Reactions  . Dust Mite Extract   . Other Other (See Comments)    Dust mites  . Pollen Extract     from trees and weeds    Current Outpatient Medications  Medication Sig Dispense Refill  . ADVAIR HFA 45-21 MCG/ACT inhaler     . cetirizine (ZYRTEC) 10 MG tablet Take 10 mg by mouth daily.    . Cholecalciferol (VITAMIN D3) 2000 units TABS Take 1 Dose by mouth daily.    Marland Kitchen levothyroxine (SYNTHROID, LEVOTHROID) 75 MCG tablet TAKE 1 TABLET BY MOUTH EVERY DAY 30 tablet 11  . Multiple Vitamin (MULTIVITAMIN WITH MINERALS) TABS Take 1 tablet by mouth daily.    Marland Kitchen PROAIR HFA 108 (90 Base) MCG/ACT inhaler TAKE 2 PUFFS BY MOUTH  EVERY 4 TO 6 HOURS AS NEEDED . MUST LAST 90 DAYS PER MD  0  . meloxicam (MOBIC) 15 MG tablet Take 15 mg by mouth daily as needed for pain.    . montelukast (SINGULAIR) 10 MG tablet Take 10 mg by mouth daily.  5   Current Facility-Administered Medications  Medication Dose Route Frequency Provider Last Rate Last Dose  . albuterol (PROVENTIL) (2.5 MG/3ML) 0.083% nebulizer solution 2.5 mg  2.5 mg Nebulization Once Chrismon, Vickki Muff, PA      . levalbuterol (XOPENEX) nebulizer solution 0.63 mg  0.63 mg Nebulization Once Fenton Malling M, PA-C          REVIEW OF SYSTEMS (Negative unless checked)  Constitutional: [] Weight loss  [] Fever  [] Chills Cardiac: [] Chest  pain   [] Chest pressure   [] Palpitations   [] Shortness of breath when laying flat   [] Shortness of breath at rest   [] Shortness of breath with exertion. Vascular:  [] Pain in legs with walking   [] Pain in legs at rest   [] Pain in legs when laying flat   [] Claudication   [] Pain in feet when walking  [] Pain in feet at rest  [] Pain in feet when laying flat   [] History of DVT   [] Phlebitis   [x] Swelling in legs   [x] Varicose veins   [] Non-healing ulcers Pulmonary:   [] Uses home oxygen   [] Productive cough   [] Hemoptysis   [] Wheeze  [] COPD   [] Asthma Neurologic:  [] Dizziness  [] Blackouts   [] Seizures   [] History of stroke   [] History of TIA  [] Aphasia   [] Temporary blindness   [] Dysphagia   [] Weakness or numbness in arms   [] Weakness or numbness in legs Musculoskeletal:  [x] Arthritis   [] Joint swelling   [x] Joint pain   [] Low back pain Hematologic:  [] Easy bruising  [] Easy bleeding   [] Hypercoagulable state   [] Anemic  [] Hepatitis Gastrointestinal:  [] Blood in stool   [] Vomiting blood  [] Gastroesophageal reflux/heartburn   [] Abdominal pain Genitourinary:  [] Chronic kidney disease   [] Difficult urination  [] Frequent urination  [] Burning with urination   [] Hematuria Skin:  [] Rashes   [] Ulcers   [] Wounds Psychological:  [] History of anxiety   []  History of major depression.    Physical Exam BP (!) 150/87 (BP Location: Right Arm, Patient Position: Sitting)   Pulse 70   Resp 16   Ht 5' 4.5" (1.638 m)   Wt 88.5 kg (195 lb)   LMP 12/04/2011   BMI 32.95 kg/m  Gen:  WD/WN, NAD Head: [place]/AT, No temporalis wasting.  Ear/Nose/Throat: Hearing grossly intact, dentition good Eyes: Sclera non-icteric. Conjunctiva clear Neck: Supple. Trachea midline Pulmonary:  Good air movement, no use of accessory muscles, respirations not labored.  Cardiac: RRR, No JVD Vascular: Varicosities diffuse and measuring up to 2 mm in the right lower extremity        Varicosities diffuse and measuring up to 2-3 mm in the left lower  extremity Vessel Right Left  Radial Palpable Palpable                          PT Palpable Palpable  DP Palpable Palpable   Gastrointestinal: soft, non-tender/non-distended.  Musculoskeletal: M/S 5/5 throughout.   1 + RLE edema.  1 + LLE edema Neurologic: Sensation grossly intact in extremities.  Symmetrical.  Speech is fluent.  Psychiatric: Judgment intact, Mood & affect appropriate for pt's clinical situation. Dermatologic: No rashes or ulcers noted.  No cellulitis or open wounds.  Radiology No results found.  Labs Recent Results (from the past 2160 hour(s))  Lipid panel     Status: Abnormal   Collection Time: 04/09/17  9:04 AM  Result Value Ref Range   Cholesterol, Total 243 (H) 100 - 199 mg/dL   Triglycerides 139 0 - 149 mg/dL   HDL 70 >39 mg/dL   VLDL Cholesterol Cal 28 5 - 40 mg/dL   LDL Calculated 145 (H) 0 - 99 mg/dL   Chol/HDL Ratio 3.5 0.0 - 4.4 ratio    Comment:                                   T. Chol/HDL Ratio                                             Men  Women                               1/2 Avg.Risk  3.4    3.3                                   Avg.Risk  5.0    4.4                                2X Avg.Risk  9.6    7.1                                3X Avg.Risk 23.4   11.0   Comprehensive metabolic panel     Status: None   Collection Time: 04/09/17  9:04 AM  Result Value Ref Range   Glucose 86 65 - 99 mg/dL   BUN 15 6 - 24 mg/dL   Creatinine, Ser 0.78 0.57 - 1.00 mg/dL   GFR calc non Af Amer 84 >59 mL/min/1.73   GFR calc Af Amer 97 >59 mL/min/1.73   BUN/Creatinine Ratio 19 9 - 23   Sodium 143 134 - 144 mmol/L   Potassium 4.0 3.5 - 5.2 mmol/L   Chloride 102 96 - 106 mmol/L   CO2 24 20 - 29 mmol/L   Calcium 9.3 8.7 - 10.2 mg/dL   Total Protein 6.7 6.0 - 8.5 g/dL   Albumin 4.5 3.5 - 5.5 g/dL   Globulin, Total 2.2 1.5 - 4.5 g/dL   Albumin/Globulin Ratio 2.0 1.2 - 2.2   Bilirubin Total 0.7 0.0 - 1.2 mg/dL   Alkaline Phosphatase 102 39 -  117 IU/L   AST 22 0 - 40 IU/L   ALT 23 0 - 32 IU/L  TSH     Status: None   Collection Time: 04/09/17  9:04 AM  Result Value Ref Range   TSH 2.100 0.450 - 4.500 uIU/mL  CBC     Status: None   Collection Time: 04/09/17  9:04 AM  Result Value Ref Range   WBC 8.4 3.4 - 10.8 x10E3/uL   RBC 4.84 3.77 - 5.28 x10E6/uL   Hemoglobin 14.3 11.1 - 15.9 g/dL   Hematocrit 42.5 34.0 - 46.6 %  MCV 88 79 - 97 fL   MCH 29.5 26.6 - 33.0 pg   MCHC 33.6 31.5 - 35.7 g/dL   RDW 14.3 12.3 - 15.4 %   Platelets 296 150 - 379 x10E3/uL    Assessment/Plan:  Degenerative arthritis of hip Has already had a hip replacement on the left as well as some arthritis of the knees.  This will certainly increase her propensity for lymphedema and leg swelling  HLD (hyperlipidemia) lipid control important in reducing the progression of atherosclerotic disease.    Swelling of limb We discussed that there are multiple causes of swelling and I suspect there is a component of lymphedema with her previous surgeries as well as venous insufficiency.  Compression stockings, elevation, and increasing her activity.  Venous reflux study planned at her follow-up visit in 3 months.  Varicose veins of leg with swelling, bilateral   The patient has symptoms consistent with chronic venous insufficiency. We discussed the natural history and treatment options for venous disease. I recommended the regular use of 20 - 30 mm Hg compression stockings, and prescribed these today. I recommended leg elevation and anti-inflammatories as needed for pain. I have also recommended a complete venous duplex to assess the venous system for reflux or thrombotic issues. This can be done at the patient's convenience. I will see the patient back in 3 months to assess the response to conservative management, and determine further treatment options.     Leotis Pain 04/18/2017, 7:41 AM   This note was created with Dragon medical transcription system.  Any  errors from dictation are unintentional.

## 2017-04-18 DIAGNOSIS — M7989 Other specified soft tissue disorders: Secondary | ICD-10-CM | POA: Insufficient documentation

## 2017-04-18 DIAGNOSIS — I83893 Varicose veins of bilateral lower extremities with other complications: Secondary | ICD-10-CM | POA: Insufficient documentation

## 2017-04-18 NOTE — Assessment & Plan Note (Signed)
lipid control important in reducing the progression of atherosclerotic disease.   

## 2017-04-18 NOTE — Patient Instructions (Signed)
Varicose Veins Varicose veins are veins that have become enlarged and twisted. They are usually seen in the legs but can occur in other parts of the body as well. What are the causes? This condition is the result of valves in the veins not working properly. Valves in the veins help to return blood from the leg to the heart. If these valves are damaged, blood flows backward and backs up into the veins in the leg near the skin. This causes the veins to become larger. What increases the risk? People who are on their feet a lot, who are pregnant, or who are overweight are more likely to develop varicose veins. What are the signs or symptoms?  Bulging, twisted-appearing, bluish veins, most commonly found on the legs.  Leg pain or a feeling of heaviness. These symptoms may be worse at the end of the day.  Leg swelling.  Changes in skin color. How is this diagnosed? A health care provider can usually diagnose varicose veins by examining your legs. Your health care provider may also recommend an ultrasound of your leg veins. How is this treated? Most varicose veins can be treated at home.However, other treatments are available for people who have persistent symptoms or want to improve the cosmetic appearance of the varicose veins. These treatment options include:  Sclerotherapy. A solution is injected into the vein to close it off.  Laser treatment. A laser is used to heat the vein to close it off.  Radiofrequency vein ablation. An electrical current produced by radio waves is used to close off the vein.  Phlebectomy. The vein is surgically removed through small incisions made over the varicose vein.  Vein ligation and stripping. The vein is surgically removed through incisions made over the varicose vein after the vein has been tied (ligated). Follow these instructions at home:   Do not stand or sit in one position for long periods of time. Do not sit with your legs crossed. Rest with your  legs raised during the day.  Wear compression stockings as directed by your health care provider. These stockings help to prevent blood clots and reduce swelling in your legs.  Do not wear other tight, encircling garments around your legs, pelvis, or waist.  Walk as much as possible to increase blood flow.  Raise the foot of your bed at night with 2-inch blocks.  If you get a cut in the skin over the vein and the vein bleeds, lie down with your leg raised and press on it with a clean cloth until the bleeding stops. Then place a bandage (dressing) on the cut. See your health care provider if it continues to bleed. Contact a health care provider if:  The skin around your ankle starts to break down.  You have pain, redness, tenderness, or hard swelling in your leg over a vein.  You are uncomfortable because of leg pain. This information is not intended to replace advice given to you by your health care provider. Make sure you discuss any questions you have with your health care provider. Document Released: 12/07/2004 Document Revised: 08/05/2015 Document Reviewed: 08/31/2015 Elsevier Interactive Patient Education  2017 [employer]  

## 2017-04-18 NOTE — Assessment & Plan Note (Signed)
Has already had a hip replacement on the left as well as some arthritis of the knees.  This will certainly increase her propensity for lymphedema and leg swelling

## 2017-04-18 NOTE — Assessment & Plan Note (Signed)
We discussed that there are multiple causes of swelling and I suspect there is a component of lymphedema with her previous surgeries as well as venous insufficiency.  Compression stockings, elevation, and increasing her activity.  Venous reflux study planned at her follow-up visit in 3 months.

## 2017-05-09 ENCOUNTER — Encounter: Payer: Self-pay | Admitting: Physician Assistant

## 2017-05-09 ENCOUNTER — Ambulatory Visit: Payer: BC Managed Care – PPO | Admitting: Physician Assistant

## 2017-05-09 VITALS — BP 130/88 | HR 65 | Temp 97.0°F | Resp 16 | Wt 196.6 lb

## 2017-05-09 DIAGNOSIS — J069 Acute upper respiratory infection, unspecified: Secondary | ICD-10-CM

## 2017-05-09 DIAGNOSIS — N3001 Acute cystitis with hematuria: Secondary | ICD-10-CM

## 2017-05-09 LAB — POCT URINALYSIS DIPSTICK
Appearance: ABNORMAL
Bilirubin, UA: NEGATIVE
Glucose, UA: NEGATIVE
Ketones, UA: NEGATIVE
Nitrite, UA: NEGATIVE
Protein, UA: NEGATIVE
Spec Grav, UA: 1.02 (ref 1.010–1.025)
Urobilinogen, UA: 0.2 E.U./dL
pH, UA: 6 (ref 5.0–8.0)

## 2017-05-09 MED ORDER — DOXYCYCLINE HYCLATE 100 MG PO TABS: 100.0000 mg | ORAL_TABLET | Freq: Two times a day (BID) | ORAL | 0 refills | Status: DC

## 2017-05-09 NOTE — Progress Notes (Signed)
Patient: Danielle Duarte Female    DOB: [birth year]   57 y.o.   MRN: [id number] Visit Date: 05/09/2017  Today's Provider: Mar Daring, PA-C   Chief Complaint  Patient presents with  . Urinary Tract Infection   Subjective:    Urinary Tract Infection   This is a new problem. The current episode started yesterday. The problem has been unchanged. The patient is experiencing no pain. There has been no fever. She is not sexually active. Associated symptoms include a discharge (yesterday notice some brown/redish discharge in her pad). Pertinent negatives include no flank pain, frequency, hematuria, nausea, sweats or urgency. Treatments tried: Cranberry Juice.  She reports she had her left ovary removed 02/28/2012. Per patient LMP: 2011-2012 before her ovary removal. Family Hx of bladder cancer-uncle father side.  Upper Respiratory Infection: Patient complains of symptoms of a URI. Symptoms include congestion, sore throat and "feels her throat is very dry after walikng up". Onset of symptoms was a few days ago, unchanged since that time. She also c/o swollen glands for the past 1 1/2  week .  She is drinking moderate amounts of fluids.      Allergies  Allergen Reactions  . Dust Mite Extract   . Other Other (See Comments)    Dust mites  . Pollen Extract     from trees and weeds     Current Outpatient Medications:  .  ADVAIR HFA 45-21 MCG/ACT inhaler, , Disp: , Rfl:  .  cetirizine (ZYRTEC) 10 MG tablet, Take 10 mg by mouth daily., Disp: , Rfl:  .  Cholecalciferol (VITAMIN D3) 2000 units TABS, Take 1 Dose by mouth daily., Disp: , Rfl:  .  levothyroxine (SYNTHROID, LEVOTHROID) 75 MCG tablet, TAKE 1 TABLET BY MOUTH EVERY DAY, Disp: 30 tablet, Rfl: 11 .  Multiple Vitamin (MULTIVITAMIN WITH MINERALS) TABS, Take 1 tablet by mouth daily., Disp: , Rfl:  .  PROAIR HFA 108 (90 Base) MCG/ACT inhaler, TAKE 2 PUFFS BY MOUTH EVERY 4 TO 6 HOURS AS NEEDED . MUST LAST 90 DAYS PER MD, Disp: ,  Rfl: 0 .  meloxicam (MOBIC) 15 MG tablet, Take 15 mg by mouth daily as needed for pain., Disp: , Rfl:  .  montelukast (SINGULAIR) 10 MG tablet, Take 10 mg by mouth daily., Disp: , Rfl: 5  Current Facility-Administered Medications:  .  albuterol (PROVENTIL) (2.5 MG/3ML) 0.083% nebulizer solution 2.5 mg, 2.5 mg, Nebulization, Once, Chrismon, Dennis E, PA .  levalbuterol (XOPENEX) nebulizer solution 0.63 mg, 0.63 mg, Nebulization, Once, Cyrena Kuchenbecker M, PA-C  Review of Systems  Constitutional: Negative for fatigue and fever.  HENT: Positive for congestion, postnasal drip, rhinorrhea and sinus pressure. Negative for ear pain, sinus pain, sore throat, tinnitus and trouble swallowing.   Respiratory: Positive for cough. Negative for chest tightness, shortness of breath and wheezing.   Cardiovascular: Negative for chest pain, palpitations and leg swelling.  Gastrointestinal: Negative for abdominal pain and nausea.  Genitourinary: Positive for enuresis and vaginal discharge. Negative for dysuria, flank pain, frequency, hematuria, urgency, vaginal bleeding and vaginal pain.  Neurological: Positive for headaches. Negative for dizziness and light-headedness.    Social History   Tobacco Use  . Smoking status: Former Smoker    Last attempt to quit: 03/13/1987    Years since quitting: 30.1  . Smokeless tobacco: Never Used  Substance Use Topics  . Alcohol use: No   Objective:   BP 130/88 (BP Location: Left Arm, Patient Position:  Sitting, Cuff Size: Normal)   Pulse 65   Temp (!) 97 F (36.1 C) (Oral)   Resp 16   Wt 196 lb 9.6 oz (89.2 kg)   LMP 12/04/2011   BMI 33.23 kg/m  Vitals:   05/09/17 1604  BP: 130/88  Pulse: 65  Resp: 16  Temp: (!) 97 F (36.1 C)  TempSrc: Oral  Weight: 196 lb 9.6 oz (89.2 kg)     Physical Exam  Constitutional: She is oriented to person, place, and time. She appears well-developed and well-nourished. No distress.  HENT:  Head: Normocephalic and  atraumatic.  Right Ear: Hearing, tympanic membrane, external ear and ear canal normal.  Left Ear: Hearing, tympanic membrane, external ear and ear canal normal.  Nose: Nose normal.  Mouth/Throat: Uvula is midline, oropharynx is clear and moist and mucous membranes are normal. No oropharyngeal exudate.  Eyes: Conjunctivae are normal. Pupils are equal, round, and reactive to light. Right eye exhibits no discharge. Left eye exhibits no discharge. No scleral icterus.  Neck: Normal range of motion. Neck supple. No tracheal deviation present. No thyromegaly present.  Cardiovascular: Normal rate, regular rhythm and normal heart sounds. Exam reveals no gallop and no friction rub.  No murmur heard. Pulmonary/Chest: Effort normal and breath sounds normal. No stridor. No respiratory distress. She has no wheezes. She has no rales.  Abdominal: Soft. Normal appearance and bowel sounds are normal. She exhibits no distension and no mass. There is no hepatosplenomegaly. There is tenderness in the suprapubic area. There is no rebound, no guarding and no CVA tenderness.  Lymphadenopathy:    She has no cervical adenopathy.  Neurological: She is alert and oriented to person, place, and time.  Skin: Skin is warm and dry. She is not diaphoretic.  Vitals reviewed.      Assessment & Plan:     1. Acute cystitis with hematuria Worsening symptoms. UA positive. Will treat empirically with Doxycycline. Continue to push fluids. Urine sent for culture. Will follow up pending C&S results. Microscopy also sent since there was trace hematuria and she has family h/o bladder cancer to confirm if truly hematuria. She is to call if symptoms do not improve or if they worsen.  - POCT urinalysis dipstick - Urine Culture - Urinalysis, microscopic only - doxycycline (VIBRA-TABS) 100 MG tablet; Take 1 tablet (100 mg total) by mouth 2 (two) times daily.  Dispense: 20 tablet; Refill: 0  2. Upper respiratory tract infection,  unspecified type Worsening symptoms that have not responded to OTC medications. Will give Doxycycline as below. Continue allergy medications. Stay well hydrated and get plenty of rest. Call if no symptom improvement or if symptoms worsen. - doxycycline (VIBRA-TABS) 100 MG tablet; Take 1 tablet (100 mg total) by mouth 2 (two) times daily.  Dispense: 20 tablet; Refill: 0       Mar Daring, PA-C  [facility]

## 2017-05-10 ENCOUNTER — Telehealth: Payer: Self-pay

## 2017-05-10 LAB — URINALYSIS, MICROSCOPIC ONLY: Casts: NONE SEEN /lpf

## 2017-05-10 NOTE — Telephone Encounter (Signed)
-----   Message from Mar Daring, PA-C sent at 05/10/2017  9:06 AM EST ----- No red blood cells were noted in the urine. Most likely from the infection as suspected.

## 2017-05-10 NOTE — Telephone Encounter (Signed)
Patient advised as below.  

## 2017-05-11 ENCOUNTER — Telehealth: Payer: Self-pay

## 2017-05-11 LAB — URINE CULTURE

## 2017-05-11 NOTE — Telephone Encounter (Signed)
-----   Message from Mar Daring, [place] sent at 05/11/2017  2:03 PM EST ----- Urine culture was positive and should be susceptible to antibiotic you are on. Continue until completed and call if symptoms do not improve.

## 2017-05-11 NOTE — Telephone Encounter (Signed)
Patient advised as below.  

## 2017-06-13 ENCOUNTER — Encounter: Payer: Self-pay | Admitting: Radiology

## 2017-06-14 ENCOUNTER — Other Ambulatory Visit: Payer: Self-pay | Admitting: Family Medicine

## 2017-07-17 ENCOUNTER — Encounter (INDEPENDENT_AMBULATORY_CARE_PROVIDER_SITE_OTHER): Payer: BC Managed Care – PPO

## 2017-07-17 ENCOUNTER — Ambulatory Visit (INDEPENDENT_AMBULATORY_CARE_PROVIDER_SITE_OTHER): Payer: BC Managed Care – PPO | Admitting: Vascular Surgery

## 2017-09-07 ENCOUNTER — Ambulatory Visit (INDEPENDENT_AMBULATORY_CARE_PROVIDER_SITE_OTHER): Payer: BC Managed Care – PPO | Admitting: Vascular Surgery

## 2017-09-07 ENCOUNTER — Encounter (INDEPENDENT_AMBULATORY_CARE_PROVIDER_SITE_OTHER): Payer: BC Managed Care – PPO

## 2017-09-20 ENCOUNTER — Ambulatory Visit: Payer: BC Managed Care – PPO | Admitting: Family Medicine

## 2017-10-24 NOTE — Progress Notes (Signed)
LAST PAP 08/15/2016- NORMAL

## 2017-10-25 ENCOUNTER — Encounter: Payer: Self-pay | Admitting: Family Medicine

## 2017-10-25 ENCOUNTER — Ambulatory Visit (INDEPENDENT_AMBULATORY_CARE_PROVIDER_SITE_OTHER): Payer: BC Managed Care – PPO | Admitting: Family Medicine

## 2017-10-25 VITALS — BP 124/83 | HR 72 | Wt 187.4 lb

## 2017-10-25 DIAGNOSIS — Z1151 Encounter for screening for human papillomavirus (HPV): Secondary | ICD-10-CM

## 2017-10-25 DIAGNOSIS — Z1239 Encounter for other screening for malignant neoplasm of breast: Secondary | ICD-10-CM

## 2017-10-25 DIAGNOSIS — Z124 Encounter for screening for malignant neoplasm of cervix: Secondary | ICD-10-CM | POA: Diagnosis not present

## 2017-10-25 DIAGNOSIS — N951 Menopausal and female climacteric states: Secondary | ICD-10-CM

## 2017-10-25 DIAGNOSIS — Z01419 Encounter for gynecological examination (general) (routine) without abnormal findings: Secondary | ICD-10-CM

## 2017-10-25 MED ORDER — PRASTERONE 6.5 MG VA INST: 1.0000 | VAGINAL_INSERT | Freq: Every day | VAGINAL | 2 refills | Status: DC

## 2017-10-25 NOTE — Progress Notes (Signed)
  Subjective:     Danielle Duarte is a 57 y.o. female and is here for a comprehensive physical exam. The patient reports problems - vaginal dryness. Also reports loss of urine with cough. Worse when has URI. 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   The following portions of the patient's history were reviewed and updated as appropriate: allergies, current medications, past family history, past medical history, past social history, past surgical history and problem list.  Review of Systems Pertinent items noted in HPI and remainder of comprehensive ROS otherwise negative.   Objective:    BP 124/83   Pulse 72   Wt 187 lb 6.4 oz (85 kg)   LMP 12/04/2011   BMI 31.67 kg/m  General appearance: alert, cooperative and appears stated age Head: Normocephalic, without obvious abnormality, atraumatic Neck: no adenopathy, supple, symmetrical, trachea midline and thyroid not enlarged, symmetric, no tenderness/mass/nodules Lungs: clear to auscultation bilaterally Breasts: normal appearance, no masses or tenderness Heart: regular rate and rhythm, S1, S2 normal, no murmur, click, rub or gallop Abdomen: soft, non-tender; bowel sounds normal; no masses,  no organomegaly Pelvic: cervix normal in appearance, external genitalia normal, no adnexal masses or tenderness, no cervical motion tenderness, uterus normal size, shape, and consistency and vagina normal without discharge Extremities: extremities normal, atraumatic, no cyanosis or edema Pulses: 2+ and symmetric Skin: Skin color, texture, turgor normal. No rashes or lesions Lymph nodes: Cervical, supraclavicular, and axillary nodes normal. Neurologic: Grossly normal    Assessment:    Healthy female exam.      Plan:   Problem List Items Addressed This Visit      Unprioritized   Vaginal dryness, menopausal - Primary    Trial of intrarosa--samples given.      Relevant Medications   Prasterone (INTRAROSA) 6.5  MG INST    Other Visit Diagnoses    Screening for breast cancer       Relevant Orders   MM Digital Screening   Screening for cervical cancer       Relevant Orders   Cytology - PAP   Encounter for gynecological examination without abnormal finding         Return in 1 year (on 10/26/2018).    See After Visit Summary for Counseling Recommendations

## 2017-10-25 NOTE — Patient Instructions (Addendum)
Intrarosa for vaginal dryness Preventive Care 40-64 Years, Female Preventive care refers to lifestyle choices and visits with your health care provider that can promote health and wellness. What does preventive care include?  A yearly physical exam. This is also called an annual well check.  Dental exams once or twice a year.  Routine eye exams. Ask your health care provider how often you should have your eyes checked.  Personal lifestyle choices, including: ? Daily care of your teeth and gums. ? Regular physical activity. ? Eating a healthy diet. ? Avoiding tobacco and drug use. ? Limiting alcohol use. ? Practicing safe sex. ? Taking low-dose aspirin daily starting at age 30. ? Taking vitamin and mineral supplements as recommended by your health care provider. What happens during an annual well check? The services and screenings done by your health care provider during your annual well check will depend on your age, overall health, lifestyle risk factors, and family history of disease. Counseling Your health care provider may ask you questions about your:  Alcohol use.  Tobacco use.  Drug use.  Emotional well-being.  Home and relationship well-being.  Sexual activity.  Eating habits.  Work and work Statistician.  Method of birth control.  Menstrual cycle.  Pregnancy history.  Screening You may have the following tests or measurements:  Height, weight, and BMI.  Blood pressure.  Lipid and cholesterol levels. These may be checked every 5 years, or more frequently if you are over 51 years old.  Skin check.  Lung cancer screening. You may have this screening every year starting at age 82 if you have a 30-pack-year history of smoking and currently smoke or have quit within the past 15 years.  Fecal occult blood test (FOBT) of the stool. You may have this test every year starting at age 42.  Flexible sigmoidoscopy or colonoscopy. You may have a sigmoidoscopy  every 5 years or a colonoscopy every 10 years starting at age 49.  Hepatitis C blood test.  Hepatitis B blood test.  Sexually transmitted disease (STD) testing.  Diabetes screening. This is done by checking your blood sugar (glucose) after you have not eaten for a while (fasting). You may have this done every 1-3 years.  Mammogram. This may be done every 1-2 years. Talk to your health care provider about when you should start having regular mammograms. This may depend on whether you have a family history of breast cancer.  BRCA-related cancer screening. This may be done if you have a family history of breast, ovarian, tubal, or peritoneal cancers.  Pelvic exam and Pap test. This may be done every 3 years starting at age 15. Starting at age 43, this may be done every 5 years if you have a Pap test in combination with an HPV test.  Bone density scan. This is done to screen for osteoporosis. You may have this scan if you are at high risk for osteoporosis.  Discuss your test results, treatment options, and if necessary, the need for more tests with your health care provider. Vaccines Your health care provider may recommend certain vaccines, such as:  Influenza vaccine. This is recommended every year.  Tetanus, diphtheria, and acellular pertussis (Tdap, Td) vaccine. You may need a Td booster every 10 years.  Varicella vaccine. You may need this if you have not been vaccinated.  Zoster vaccine. You may need this after age 12.  Measles, mumps, and rubella (MMR) vaccine. You may need at least one dose of MMR if  you were born in [birth year] or later. You may also need a second dose.  Pneumococcal 13-valent conjugate (PCV13) vaccine. You may need this if you have certain conditions and were not previously vaccinated.  Pneumococcal polysaccharide (PPSV23) vaccine. You may need one or two doses if you smoke cigarettes or if you have certain conditions.  Meningococcal vaccine. You may need this if  you have certain conditions.  Hepatitis A vaccine. You may need this if you have certain conditions or if you travel or work in places where you may be exposed to hepatitis A.  Hepatitis B vaccine. You may need this if you have certain conditions or if you travel or work in places where you may be exposed to hepatitis B.  Haemophilus influenzae type b (Hib) vaccine. You may need this if you have certain conditions.  Talk to your health care provider about which screenings and vaccines you need and how often you need them. This information is not intended to replace advice given to you by your health care provider. Make sure you discuss any questions you have with your health care provider. Document Released: 03/26/2015 Document Revised: 11/17/2015 Document Reviewed: 12/29/2014 Elsevier Interactive Patient Education  [employer].

## 2017-10-25 NOTE — Assessment & Plan Note (Signed)
Trial of intrarosa--samples given.

## 2017-11-05 ENCOUNTER — Encounter: Payer: Self-pay | Admitting: Family Medicine

## 2017-11-05 DIAGNOSIS — R87810 Cervical high risk human papillomavirus (HPV) DNA test positive: Secondary | ICD-10-CM | POA: Insufficient documentation

## 2017-11-05 LAB — CYTOLOGY - PAP
Diagnosis: NEGATIVE
HPV 16/18/45 genotyping: NEGATIVE
HPV: DETECTED — AB

## 2017-12-04 ENCOUNTER — Ambulatory Visit (INDEPENDENT_AMBULATORY_CARE_PROVIDER_SITE_OTHER): Payer: BC Managed Care – PPO | Admitting: Vascular Surgery

## 2017-12-04 ENCOUNTER — Encounter (INDEPENDENT_AMBULATORY_CARE_PROVIDER_SITE_OTHER): Payer: BC Managed Care – PPO

## 2017-12-07 ENCOUNTER — Encounter (INDEPENDENT_AMBULATORY_CARE_PROVIDER_SITE_OTHER): Payer: Self-pay | Admitting: Nurse Practitioner

## 2017-12-07 ENCOUNTER — Ambulatory Visit (INDEPENDENT_AMBULATORY_CARE_PROVIDER_SITE_OTHER): Payer: BC Managed Care – PPO

## 2017-12-07 ENCOUNTER — Ambulatory Visit (INDEPENDENT_AMBULATORY_CARE_PROVIDER_SITE_OTHER): Payer: BC Managed Care – PPO | Admitting: Nurse Practitioner

## 2017-12-07 VITALS — BP 143/81 | HR 68 | Resp 16 | Ht 64.5 in | Wt 186.0 lb

## 2017-12-07 DIAGNOSIS — M1612 Unilateral primary osteoarthritis, left hip: Secondary | ICD-10-CM

## 2017-12-07 DIAGNOSIS — Z87891 Personal history of nicotine dependence: Secondary | ICD-10-CM | POA: Diagnosis not present

## 2017-12-07 DIAGNOSIS — E785 Hyperlipidemia, unspecified: Secondary | ICD-10-CM | POA: Diagnosis not present

## 2017-12-07 DIAGNOSIS — I83893 Varicose veins of bilateral lower extremities with other complications: Secondary | ICD-10-CM

## 2017-12-07 NOTE — Progress Notes (Signed)
Subjective:    Patient ID: Danielle Duarte, female    DOB: 1960/07/24, 57 y.o.   MRN: [id number] Chief Complaint  Patient presents with  . Follow-up    70month bil ven reflux    HPI  Danielle Duarte is a 57 y.o. female  That returns for followup evaluation 3 months after the initial visit. The patient continues to have pain in the lower extremities with dependency. The pain is lessened with elevation. Graduated compression stockings, Class I (20-30 mmHg), have been worn but the stockings have helped with the leg pain. Over-the-counter analgesics do not improve the symptoms. The degree of discomfort does not interfere with daily activities.   Venous ultrasound shows no evidence of DVT bilaterally no evidence of superficial venous thrombosis bilaterally.  Reflux noted in the common femoral vein of the right lower extremity.  The left lower extremity shows reflux of the great saphenous vein from the level of the knee to the calf.  No evidence of reflux in the small saphenous vein bilaterally.  No evidence of fluid or extravascular structures noted in the bilateral popliteal fossa's.  Review of Systems   Review of Systems: Negative Unless Checked Constitutional: [] Weight loss  [] Fever  [] Chills Cardiac: [] Chest pain   ? Atrial Fibrillation  [] Palpitations   [] Shortness of breath when laying flat   [] Shortness of breath with exertion. Vascular:  [] Pain in legs with walking   [] Pain in legs with standing  [] History of DVT   [] Phlebitis   [] Swelling in legs   [x] Varicose veins   [] Non-healing ulcers Pulmonary:   [] Uses home oxygen   [] Productive cough   [] Hemoptysis   [] Wheeze  [] COPD   [] Asthma Neurologic:  [] Dizziness   [] Seizures   [] History of stroke   [] History of TIA  [] Aphasia   [] Vissual changes   [] Weakness or numbness in arm   [] Weakness or numbness in leg Musculoskeletal:   [] Joint swelling   [] Joint pain   [] Low back pain  X-History of Knee Replacement Hematologic:  [] Easy bruising   [] Easy bleeding   [] Hypercoagulable state   [] Anemic Gastrointestinal:  [] Diarrhea   [] Vomiting  [] Gastroesophageal reflux/heartburn   [] Difficulty swallowing. Genitourinary:  [] Chronic kidney disease   [] Difficult urination  [] Anuric   [] Blood in urine Skin:  [] Rashes   [] Ulcers  Psychological:  [] History of anxiety   []  History of major depression  ? Memory Difficulties     Objective:   Physical Exam  BP (!) 143/81 (BP Location: Right Arm)   Pulse 68   Resp 16   Ht 5' 4.5" (1.638 m)   Wt 186 lb (84.4 kg)   LMP 12/04/2011   BMI 31.43 kg/m   Past Medical History:  Diagnosis Date  . Abnormal Pap smear   . Arthritis    right knee  . Fibroids   . PONV (postoperative nausea and vomiting)      Gen: WD/WN, NAD Head: [place]/AT, No temporalis wasting.  Ear/Nose/Throat: Hearing grossly intact, nares w/o erythema or drainage Eyes: PER, EOMI, sclera nonicteric.  Neck: Supple, no masses.  No JVD.  Pulmonary:  Good air movement, no use of accessory muscles.  Cardiac: RRR Vascular:  Scattered varicose veins noted bilaterally. Vessel Right Left  Radial Palpable Palpable  Dorsalis Pedis Palpable Palpable  Posterior Tibial Palpable Palpable   Gastrointestinal: soft, non-distended. No guarding/no peritoneal signs.  Musculoskeletal: M/S 5/5 throughout.  No deformity or atrophy.  Neurologic: Pain and light touch intact in extremities.  Symmetrical.  Speech is fluent. Motor exam as listed above. Psychiatric: Judgment intact, Mood & affect appropriate for pt's clinical situation. Dermatologic: No Venous rashes. No Ulcers Noted.  No changes consistent with cellulitis. Lymph : No Cervical lymphadenopathy, no lichenification or skin changes of chronic lymphedema.   Social History   Socioeconomic History  . Marital status: Divorced    Spouse name: Not on file  . Number of children: Not on file  . Years of education: Not on file  . Highest education level: Not on file  Occupational History   . Not on file  Social Needs  . Financial resource strain: Not on file  . Food insecurity:    Worry: Not on file    Inability: Not on file  . Transportation needs:    Medical: Not on file    Non-medical: Not on file  Tobacco Use  . Smoking status: Former Smoker    Last attempt to quit: 03/13/1987    Years since quitting: 30.7  . Smokeless tobacco: Never Used  Substance and Sexual Activity  . Alcohol use: No  . Drug use: No  . Sexual activity: Never  Lifestyle  . Physical activity:    Days per week: Not on file    Minutes per session: Not on file  . Stress: Not on file  Relationships  . Social connections:    Talks on phone: Not on file    Gets together: Not on file    Attends religious service: Not on file    Active member of club or organization: Not on file    Attends meetings of clubs or organizations: Not on file    Relationship status: Not on file  . Intimate partner violence:    Fear of current or ex partner: Not on file    Emotionally abused: Not on file    Physically abused: Not on file    Forced sexual activity: Not on file  Other Topics Concern  . Not on file  Social History Narrative  . Not on file    Past Surgical History:  Procedure Laterality Date  . APPENDECTOMY    . COLONOSCOPY  10/10/2010  . COLPOSCOPY  03/01/10  . HERNIA REPAIR  November 2014  . KNEE SURGERY    . LAPAROSCOPIC LYSIS OF ADHESIONS  02/28/2012   Procedure: LAPAROSCOPIC LYSIS OF ADHESIONS;  Surgeon: Osborne Oman, MD;  Location: [facility];  Service: Gynecology;  Laterality: N/A;  . LAPAROSCOPY  02/28/2012   Procedure: LAPAROSCOPY OPERATIVE;  Surgeon: Osborne Oman, MD;  Location: [facility];  Service: Gynecology;  Laterality: N/A;  . SALPINGOOPHORECTOMY  02/28/2012   Procedure: SALPINGO OOPHORECTOMY;  Surgeon: Osborne Oman, MD;  Location: [facility];  Service: Gynecology;  Laterality: Left;  . TOTAL HIP ARTHROPLASTY  04/19/15   Left    Family History  Problem Relation Age of  Onset  . Hypertension Mother   . Hypertension Father   . Diabetes Paternal Uncle     Allergies  Allergen Reactions  . Dust Mite Extract   . Other Other (See Comments)    Dust mites  . Pollen Extract     from trees and weeds       Assessment & Plan:   1. Varicose veins of leg with swelling, bilateral Venous ultrasound shows no evidence of DVT bilaterally no evidence of superficial venous thrombosis bilaterally.  Reflux noted in the common femoral vein of the right lower extremity.  The left lower extremity shows reflux of  the great saphenous vein from the level of the knee to the calf.  No evidence of reflux in the small saphenous vein bilaterally.  No evidence of fluid or extravascular structures noted in the bilateral popliteal fossa's.  Recommend:  The patient is complaining of varicose veins.    I have had a long discussion with the patient regarding  varicose veins and why they cause symptoms.  Patient will begin wearing graduated compression stockings on a daily basis, beginning first thing in the morning and removing them in the evening. The patient is instructed specifically not to sleep in the stockings.    The patient  will also begin using over-the-counter analgesics such as Motrin 600 mg po TID to help control the symptoms as needed.    In addition, behavioral modification including elevation during the day will be initiated, utilizing a recliner was recommended.  The patient is also instructed to continue exercising such as walking 4-5 times per week.  At this time the patient wishes to continue conservative therapy and is not interested in more invasive treatments such as laser ablation and sclerotherapy.  The Patient will follow up PRN if the symptoms worsen.  2. Hyperlipidemia, unspecified hyperlipidemia type Continue statin as ordered and reviewed, no changes at this time   3. Primary osteoarthritis of left hip Continue NSAID medications as already ordered,  these medications have been reviewed and there are no changes at this time.  Continued activity and therapy was stressed.   Current Outpatient Medications on File Prior to Visit  Medication Sig Dispense Refill  . ADVAIR HFA 115-21 MCG/ACT inhaler     . cetirizine (ZYRTEC) 10 MG tablet Take 10 mg by mouth daily.    . Cholecalciferol (VITAMIN D3) 2000 units TABS Take 1 Dose by mouth daily.    Marland Kitchen levothyroxine (SYNTHROID, LEVOTHROID) 75 MCG tablet Take 1 tablet (75 mcg total) by mouth daily. 30 tablet 9  . meloxicam (MOBIC) 15 MG tablet Take 15 mg by mouth daily as needed for pain.    . Multiple Vitamin (MULTIVITAMIN WITH MINERALS) TABS Take 1 tablet by mouth daily.    Marland Kitchen PROAIR HFA 108 (90 Base) MCG/ACT inhaler TAKE 2 PUFFS BY MOUTH EVERY 4 TO 6 HOURS AS NEEDED . MUST LAST 90 DAYS PER MD  0  . montelukast (SINGULAIR) 10 MG tablet Take 10 mg by mouth daily.  5  . Prasterone (INTRAROSA) 6.5 MG INST Place 1 capsule vaginally daily. (Patient not taking: Reported on 12/07/2017) 30 each 2   Current Facility-Administered Medications on File Prior to Visit  Medication Dose Route Frequency Provider Last Rate Last Dose  . albuterol (PROVENTIL) (2.5 MG/3ML) 0.083% nebulizer solution 2.5 mg  2.5 mg Nebulization Once Chrismon, Vickki Muff, PA      . levalbuterol (XOPENEX) nebulizer solution 0.63 mg  0.63 mg Nebulization Once Fenton Malling M, PA-C        There are no Patient Instructions on file for this visit. No follow-ups on file.   Kris Hartmann, NP

## 2017-12-08 ENCOUNTER — Encounter: Payer: Self-pay | Admitting: Physician Assistant

## 2017-12-08 ENCOUNTER — Ambulatory Visit (INDEPENDENT_AMBULATORY_CARE_PROVIDER_SITE_OTHER): Payer: BC Managed Care – PPO | Admitting: Physician Assistant

## 2017-12-08 VITALS — BP 155/89 | HR 79 | Temp 99.3°F | Resp 16 | Wt 188.0 lb

## 2017-12-08 DIAGNOSIS — J40 Bronchitis, not specified as acute or chronic: Secondary | ICD-10-CM | POA: Diagnosis not present

## 2017-12-08 DIAGNOSIS — J040 Acute laryngitis: Secondary | ICD-10-CM | POA: Diagnosis not present

## 2017-12-08 MED ORDER — PROMETHAZINE-DM 6.25-15 MG/5ML PO SYRP: 5.0000 mL | ORAL_SOLUTION | Freq: Four times a day (QID) | ORAL | 0 refills | Status: DC | PRN

## 2017-12-08 MED ORDER — IPRATROPIUM-ALBUTEROL 0.5-2.5 (3) MG/3ML IN SOLN: 3.0000 mL | Freq: Once | RESPIRATORY_TRACT | Status: DC

## 2017-12-08 MED ORDER — LEVALBUTEROL HCL 1.25 MG/0.5ML IN NEBU
1.2500 mg | INHALATION_SOLUTION | Freq: Once | RESPIRATORY_TRACT | Status: AC
  Administered 2017-12-08: 1.25 mg via RESPIRATORY_TRACT

## 2017-12-08 MED ORDER — AMOXICILLIN-POT CLAVULANATE 875-125 MG PO TABS: 1.0000 | ORAL_TABLET | Freq: Two times a day (BID) | ORAL | 0 refills | Status: DC

## 2017-12-08 NOTE — Patient Instructions (Signed)
Acute Bronchitis, Adult Acute bronchitis is sudden (acute) swelling of the air tubes (bronchi) in the lungs. Acute bronchitis causes these tubes to fill with mucus, which can make it hard to breathe. It can also cause coughing or wheezing. In adults, acute bronchitis usually goes away within 2 weeks. A cough caused by bronchitis may last up to 3 weeks. Smoking, allergies, and asthma can make the condition worse. Repeated episodes of bronchitis may cause further lung problems, such as chronic obstructive pulmonary disease (COPD). What are the causes? This condition can be caused by germs and by substances that irritate the lungs, including:  Cold and flu viruses. This condition is most often caused by the same virus that causes a cold.  Bacteria.  Exposure to tobacco smoke, dust, fumes, and air pollution.  What increases the risk? This condition is more likely to develop in people who:  Have close contact with someone with acute bronchitis.  Are exposed to lung irritants, such as tobacco smoke, dust, fumes, and vapors.  Have a weak immune system.  Have a respiratory condition such as asthma.  What are the signs or symptoms? Symptoms of this condition include:  A cough.  Coughing up clear, yellow, or green mucus.  Wheezing.  Chest congestion.  Shortness of breath.  A fever.  Body aches.  Chills.  A sore throat.  How is this diagnosed? This condition is usually diagnosed with a physical exam. During the exam, your health care provider may order tests, such as chest X-rays, to rule out other conditions. He or she may also:  Test a sample of your mucus for bacterial infection.  Check the level of oxygen in your blood. This is done to check for pneumonia.  Do a chest X-ray or lung function testing to rule out pneumonia and other conditions.  Perform blood tests.  Your health care provider will also ask about your symptoms and medical history. How is this  treated? Most cases of acute bronchitis clear up over time without treatment. Your health care provider may recommend:  Drinking more fluids. Drinking more makes your mucus thinner, which may make it easier to breathe.  Taking a medicine for a fever or cough.  Taking an antibiotic medicine.  Using an inhaler to help improve shortness of breath and to control a cough.  Using a cool mist vaporizer or humidifier to make it easier to breathe.  Follow these instructions at home: Medicines  Take over-the-counter and prescription medicines only as told by your health care provider.  If you were prescribed an antibiotic, take it as told by your health care provider. Do not stop taking the antibiotic even if you start to feel better. General instructions  Get plenty of rest.  Drink enough fluids to keep your urine clear or pale yellow.  Avoid smoking and secondhand smoke. Exposure to cigarette smoke or irritating chemicals will make bronchitis worse. If you smoke and you need help quitting, ask your health care provider. Quitting smoking will help your lungs heal faster.  Use an inhaler, cool mist vaporizer, or humidifier as told by your health care provider.  Keep all follow-up visits as told by your health care provider. This is important. How is this prevented? To lower your risk of getting this condition again:  Wash your hands often with soap and water. If soap and water are not available, use hand sanitizer.  Avoid contact with people who have cold symptoms.  Try not to touch your hands to your   mouth, nose, or eyes.  Make sure to get the flu shot every year.  Contact a health care provider if:  Your symptoms do not improve in 2 weeks of treatment. Get help right away if:  You cough up blood.  You have chest pain.  You have severe shortness of breath.  You become dehydrated.  You faint or keep feeling like you are going to faint.  You keep vomiting.  You have a  severe headache.  Your fever or chills gets worse. This information is not intended to replace advice given to you by your health care provider. Make sure you discuss any questions you have with your health care provider. Document Released: 04/06/2004 Document Revised: 09/22/2015 Document Reviewed: 08/18/2015 Elsevier Interactive Patient Education  2018 [employer] Laryngitis Laryngitis is inflammation of your vocal cords. This causes hoarseness, coughing, loss of voice, sore throat, or a dry throat. Your vocal cords are two bands of muscles that are found in your throat. When you speak, these cords come together and vibrate. These vibrations come out through your mouth as sound. When your vocal cords are inflamed, your voice sounds different. Laryngitis can be temporary (acute) or long-term (chronic). Most cases of acute laryngitis improve with time. Chronic laryngitis is laryngitis that lasts for more than three weeks. What are the causes? Acute laryngitis may be caused by:  A viral infection.  Lots of talking, yelling, or singing. This is also called vocal strain.  Bacterial infections.  Chronic laryngitis may be caused by:  Vocal strain.  Injury to your vocal cords.  Acid reflux (gastroesophageal reflux disease or GERD).  Allergies.  Sinus infection.  Smoking.  Alcohol abuse.  Breathing in chemicals or dust.  Growths on the vocal cords.  What increases the risk? Risk factors for laryngitis include:  Smoking.  Alcohol abuse.  Having allergies.  What are the signs or symptoms? Symptoms of laryngitis may include:  Low, hoarse voice.  Loss of voice.  Dry cough.  Sore throat.  Stuffy nose.  How is this diagnosed? Laryngitis may be diagnosed by:  Physical exam.  Throat culture.  Blood test.  Laryngoscopy. This procedure allows your health care provider to look at your vocal cords with a mirror or viewing tube.  How is this treated? Treatment  for laryngitis depends on what is causing it. Usually, treatment involves resting your voice and using medicines to soothe your throat. However, if your laryngitis is caused by a bacterial infection, you may need to take antibiotic medicine. If your laryngitis is caused by a growth, you may need to have a procedure to remove it. Follow these instructions at home:  Drink enough fluid to keep your urine clear or pale yellow.  Breathe in moist air. Use a humidifier if you live in a dry climate.  Take medicines only as directed by your health care provider.  If you were prescribed an antibiotic medicine, finish it all even if you start to feel better.  Do not smoke cigarettes or electronic cigarettes. If you need help quitting, ask your health care provider.  Talk as little as possible. Also avoid whispering, which can cause vocal strain.  Write instead of talking. Do this until your voice is back to normal. Contact a health care provider if:  You have a fever.  You have increasing pain.  You have difficulty swallowing. Get help right away if:  You cough up blood.  You have trouble breathing. This information is not intended to replace  advice given to you by your health care provider. Make sure you discuss any questions you have with your health care provider. Document Released: 02/27/2005 Document Revised: 08/05/2015 Document Reviewed: 08/12/2013 Elsevier Interactive Patient Education  [employer].

## 2017-12-08 NOTE — Progress Notes (Signed)
Danielle Duarte  MRN: [id number] DOB: April 29, 1960  Subjective:  HPI   The patient is a 57 year old female who presents today with 3 days of laryngitis and 3 weeks of allergies/sinus issues.  She was seen by her allergist on 11/27/17 and had her Advair increased.   She reports having cough productive of yellow phlegm, fatigue, cough and possible fever.    Patient Active Problem List   Diagnosis Date Noted  . Cervical high risk human papillomavirus (HPV) DNA test positive 11/05/2017  . Vaginal dryness, menopausal 10/25/2017  . Swelling of limb 04/18/2017  . Varicose veins of leg with swelling, 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/18/2017  . 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 Asthma 03/31/2015  . Allergic rhinitis 03/30/2015  . Allergic asthma 03/30/2015  . Direct hernia 03/30/2015  . Edema extremities 03/30/2015  . 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/30/2015  . History of palpitations 03/30/2015  . HLD (hyperlipidemia) 03/30/2015  . BMI 33.0-33.9,adult 03/30/2015  . Beat, premature ventricular 03/30/2015  . Nail deformity 03/30/2015  . Degenerative arthritis of hip 01/14/2015  . Arthritis of knee, degenerative 01/14/2015  . Subclinical hypothyroidism 12/07/2014  . Paroxysmal supraventricular tachycardia ([place]) 01/05/2014  . Breathlessness on exertion 12/16/2013  . Normal cytology on  Pap smear with positive HPV test 06/12/2012  . 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 DUB (dysfunctional uterine bleeding) 12/08/2011    Past Medical History:  Diagnosis Date  . Abnormal Pap smear   . Arthritis    right knee  . Fibroids   . PONV (postoperative nausea and vomiting)     Social History   Socioeconomic History  . Marital status: Divorced    Spouse name: Not on file  . Number of children: Not on file  . Years of education: Not on file  . Highest education level: Not on file  Occupational History  . Not on file  Social Needs  .  Financial resource strain: Not on file  . Food insecurity:    Worry: Not on file    Inability: Not on file  . Transportation needs:    Medical: Not on file    Non-medical: Not on file  Tobacco Use  . Smoking status: Former Smoker    Last attempt to quit: 03/13/1987    Years since quitting: 30.7  . Smokeless tobacco: Never Used  Substance and Sexual Activity  . Alcohol use: No  . Drug use: No  . Sexual activity: Never  Lifestyle  . Physical activity:    Days per week: Not on file    Minutes per session: Not on file  . Stress: Not on file  Relationships  . Social connections:    Talks on phone: Not on file    Gets together: Not on file    Attends religious service: Not on file    Active member of club or organization: Not on file    Attends meetings of clubs or organizations: Not on file    Relationship status: Not on file  . Intimate partner violence:    Fear of current or ex partner: Not on file    Emotionally abused: Not on file    Physically abused: Not on file    Forced sexual activity: Not on file  Other Topics Concern  . Not on file  Social History Narrative  . Not on file    Outpatient Encounter Medications as of 12/08/2017  Medication Sig Note  .  Multiple Vitamin (MULTIVITAMIN WITH MINERALS) TABS Take 1 tablet by mouth daily.   Marland Kitchen ADVAIR HFA 115-21 MCG/ACT inhaler    . cetirizine (ZYRTEC) 10 MG tablet Take 10 mg by mouth daily.   . Cholecalciferol (VITAMIN D3) 2000 units TABS Take 1 Dose by mouth daily.   Marland Kitchen levothyroxine (SYNTHROID, LEVOTHROID) 75 MCG tablet Take 1 tablet (75 mcg total) by mouth daily.   . meloxicam (MOBIC) 15 MG tablet Take 15 mg by mouth daily as needed for pain.   . montelukast (SINGULAIR) 10 MG tablet Take 10 mg by mouth daily.   . Prasterone (INTRAROSA) 6.5 MG INST Place 1 capsule vaginally daily. (Patient not taking: Reported on 12/07/2017)   . PROAIR HFA 108 (90 Base) MCG/ACT inhaler TAKE 2 PUFFS BY MOUTH EVERY 4 TO 6 HOURS AS NEEDED .  MUST LAST 90 DAYS PER MD 08/28/2015: Received from: External Pharmacy   Facility-Administered Encounter Medications as of 12/08/2017  Medication  . albuterol (PROVENTIL) (2.5 MG/3ML) 0.083% nebulizer solution 2.5 mg  . levalbuterol (XOPENEX) nebulizer solution 0.63 mg    Allergies  Allergen Reactions  . Dust Mite Extract   . Other Other (See Comments)    Dust mites  . Pollen Extract     from trees and weeds    Review of Systems  Constitutional: Positive for chills, fever and malaise/fatigue.  HENT: Positive for congestion (HEAD) and sore throat (resolved). Negative for ear discharge, ear pain (fullness), hearing loss, nosebleeds, sinus pain and tinnitus.   Eyes: Negative for blurred vision, double vision, photophobia, pain, discharge and redness.  Respiratory: Positive for cough, sputum production, shortness of breath and wheezing. Negative for hemoptysis.   Cardiovascular: Positive for chest pain (one spot in upper left area, patient thinks from cough). Negative for palpitations, claudication and leg swelling.    Objective:  BP (!) 155/89 (BP Location: Right Arm, Patient Position: Sitting, Cuff Size: Normal)   Pulse 79   Temp 99.3 F (37.4 C) (Oral)   Resp 16   Wt 188 lb (85.3 kg)   LMP 12/04/2011   SpO2 97%   BMI 31.77 kg/m   Physical Exam  Constitutional: She is well-developed, well-nourished, and in no distress. She has a sickly appearance.  HENT:  Head: Normocephalic and atraumatic.  Right Ear: Hearing, tympanic membrane, external ear and ear canal normal.  Left Ear: Hearing, external ear and ear canal normal. A middle ear effusion (opaque) is present.  Nose: Mucosal edema present. No rhinorrhea. Right sinus exhibits no maxillary sinus tenderness and no frontal sinus tenderness. Left sinus exhibits no maxillary sinus tenderness and no frontal sinus tenderness.  Mouth/Throat: Uvula is midline. Posterior oropharyngeal erythema present. No oropharyngeal exudate or posterior  oropharyngeal edema.  Cardiovascular: Normal rate, regular rhythm and normal heart sounds.  No murmur heard. Pulmonary/Chest: Effort normal. No respiratory distress. She has wheezes (very fine expiratory throughout). She has no rales.  Lymphadenopathy:    She has cervical adenopathy (left anterior chain).  Skin: Skin is warm and dry.  Vitals reviewed.   Assessment and Plan :   1. Bronchitis Xopenex breathing treatment given to patient today. Lung sounds improved following treatment. Augmentin will be sent in due to prolonged illness (3 weeks with worsening symptoms). Promethazine-DM cough medication given. May continue advil and tylenol prn. Continue all allergy medications. Continue Mucinex prn. Voice rest and salt water gargles for laryngitis. Call if symptoms worsen.  - promethazine-dextromethorphan (PROMETHAZINE-DM) 6.25-15 MG/5ML syrup; Take 5 mLs by mouth 4 (four) times  daily as needed for cough.  Dispense: 200 mL; Refill: 0 - amoxicillin-clavulanate (AUGMENTIN) 875-125 MG tablet; Take 1 tablet by mouth 2 (two) times daily.  Dispense: 20 tablet; Refill: 0 - levalbuterol (XOPENEX) nebulizer solution 1.25 mg  2. Laryngitis See above medical treatment plan.

## 2018-01-14 ENCOUNTER — Ambulatory Visit
Admission: RE | Admit: 2018-01-14 | Discharge: 2018-01-14 | Disposition: A | Discharge status: Home / Self Care | Payer: BC Managed Care – PPO | Source: Ambulatory Visit | Attending: Family Medicine | Admitting: Family Medicine

## 2018-01-14 DIAGNOSIS — Z1239 Encounter for other screening for malignant neoplasm of breast: Secondary | ICD-10-CM

## 2018-01-23 ENCOUNTER — Encounter (INDEPENDENT_AMBULATORY_CARE_PROVIDER_SITE_OTHER): Payer: BC Managed Care – PPO

## 2018-01-23 ENCOUNTER — Ambulatory Visit (INDEPENDENT_AMBULATORY_CARE_PROVIDER_SITE_OTHER): Payer: BC Managed Care – PPO | Admitting: Vascular Surgery

## 2018-01-23 ENCOUNTER — Encounter

## 2018-04-09 ENCOUNTER — Encounter: Payer: Self-pay | Admitting: Family Medicine

## 2018-04-09 ENCOUNTER — Ambulatory Visit (INDEPENDENT_AMBULATORY_CARE_PROVIDER_SITE_OTHER): Payer: BC Managed Care – PPO | Admitting: Family Medicine

## 2018-04-09 VITALS — BP 138/82 | HR 75 | Temp 98.4°F | Resp 16 | Ht 65.0 in | Wt 194.0 lb

## 2018-04-09 DIAGNOSIS — J4530 Mild persistent asthma, uncomplicated: Secondary | ICD-10-CM | POA: Diagnosis not present

## 2018-04-09 DIAGNOSIS — Z6832 Body mass index (BMI) 32.0-32.9, adult: Secondary | ICD-10-CM

## 2018-04-09 DIAGNOSIS — E039 Hypothyroidism, unspecified: Secondary | ICD-10-CM | POA: Diagnosis not present

## 2018-04-09 DIAGNOSIS — Z0000 Encounter for general adult medical examination without abnormal findings: Secondary | ICD-10-CM

## 2018-04-09 DIAGNOSIS — Z23 Encounter for immunization: Secondary | ICD-10-CM

## 2018-04-09 DIAGNOSIS — L219 Seborrheic dermatitis, unspecified: Secondary | ICD-10-CM

## 2018-04-09 DIAGNOSIS — Z789 Other specified health status: Secondary | ICD-10-CM | POA: Insufficient documentation

## 2018-04-09 DIAGNOSIS — E785 Hyperlipidemia, unspecified: Secondary | ICD-10-CM

## 2018-04-09 NOTE — Patient Instructions (Signed)
. Please review the attached list of medications and notify my office if there are any errors.   . Please bring all of your medications to every appointment so we can make sure that our medication list is the same as yours.   . It is recommended to engage in 150 minutes of moderate exercise every week.     Mediterranean Diet A Mediterranean diet refers to food and lifestyle choices that are based on the traditions of countries located on the [employer]. This way of eating has been shown to help prevent certain conditions and improve outcomes for people who have chronic diseases, like kidney disease and heart disease. What are tips for following this plan? Lifestyle  Cook and eat meals together with your family, when possible.  Drink enough fluid to keep your urine clear or pale yellow.  Be physically active every day. This includes: ? Aerobic exercise like running or swimming. ? Leisure activities like gardening, walking, or housework.  Get 7-8 hours of sleep each night.  If recommended by your health care provider, drink red wine in moderation. This means 1 glass a day for nonpregnant women and 2 glasses a day for men. A glass of wine equals 5 oz (150 mL). Reading food labels   Check the serving size of packaged foods. For foods such as rice and pasta, the serving size refers to the amount of cooked product, not dry.  Check the total fat in packaged foods. Avoid foods that have saturated fat or trans fats.  Check the ingredients list for added sugars, such as corn syrup. Shopping  At the grocery store, buy most of your food from the areas near the walls of the store. This includes: ? Fresh fruits and vegetables (produce). ? Grains, beans, nuts, and seeds. Some of these may be available in unpackaged forms or large amounts (in bulk). ? Fresh seafood. ? Poultry and eggs. ? Low-fat dairy products.  Buy whole ingredients instead of prepackaged foods.  Buy fresh fruits  and vegetables in-season from local farmers markets.  Buy frozen fruits and vegetables in resealable bags.  If you do not have access to quality fresh seafood, buy precooked frozen shrimp or canned fish, such as tuna, salmon, or sardines.  Buy small amounts of raw or cooked vegetables, salads, or olives from the deli or salad bar at your store.  Stock your pantry so you always have certain foods on hand, such as olive oil, canned tuna, canned tomatoes, rice, pasta, and beans. Cooking  Cook foods with extra-virgin olive oil instead of using butter or other vegetable oils.  Have meat as a side dish, and have vegetables or grains as your main dish. This means having meat in small portions or adding small amounts of meat to foods like pasta or stew.  Use beans or vegetables instead of meat in common dishes like chili or lasagna.  Experiment with different cooking methods. Try roasting or broiling vegetables instead of steaming or sauteing them.  Add frozen vegetables to soups, stews, pasta, or rice.  Add nuts or seeds for added healthy fat at each meal. You can add these to yogurt, salads, or vegetable dishes.  Marinate fish or vegetables using olive oil, lemon juice, garlic, and fresh herbs. Meal planning   Plan to eat 1 vegetarian meal one day each week. Try to work up to 2 vegetarian meals, if possible.  Eat seafood 2 or more times a week.  Have healthy snacks readily available, such  as: ? Vegetable sticks with hummus. ? [place] yogurt. ? Fruit and nut trail mix.  Eat balanced meals throughout the week. This includes: ? Fruit: 2-3 servings a day ? Vegetables: 4-5 servings a day ? Low-fat dairy: 2 servings a day ? Fish, poultry, or lean meat: 1 serving a day ? Beans and legumes: 2 or more servings a week ? Nuts and seeds: 1-2 servings a day ? Whole grains: 6-8 servings a day ? Extra-virgin olive oil: 3-4 servings a day  Limit red meat and sweets to only a few servings a  month What are my food choices?  Mediterranean diet ? Recommended ? Grains: Whole-grain pasta. Brown rice. Bulgar wheat. Polenta. Couscous. Whole-wheat bread. [place]. ? Vegetables: Artichokes. Beets. Broccoli. Cabbage. Carrots. Eggplant. Green beans. Chard. Kale. Spinach. Onions. Leeks. Peas. Squash. Tomatoes. Peppers. Radishes. ? Fruits: Apples. Apricots. Avocado. Berries. Bananas. Cherries. Dates. Figs. Grapes. Lemons. Melon. Oranges. Peaches. Plums. Pomegranate. ? Meats and other protein foods: Beans. Almonds. Sunflower seeds. Pine nuts. Peanuts. [place]. Salmon. Scall[place]. Shrimp. [place]. Tilapia. Clams. Oysters. Eggs. ? Dairy: Low-fat milk. Cheese. Greek yogurt. ? Beverages: Water. Red wine. Herbal tea. ? Fats and oils: Extra virgin olive oil. Avocado oil. Grape seed oil. ? Sweets and desserts: [place] yogurt with honey. Baked apples. Poached pears. Trail mix. ? Seasoning and other foods: Basil. Cilantro. Coriander. Cumin. Mint. Parsley. Sage. Rosemary. Tarragon. Garlic. Oregano. Thyme. Pepper. Balsalmic vinegar. Tahini. Hummus. Tomato sauce. Olives. Mushrooms. ? Limit these ? Grains: Prepackaged pasta or rice dishes. Prepackaged cereal with added sugar. ? Vegetables: Deep fried potatoes (french fries). ? Fruits: Fruit canned in syrup. ? Meats and other protein foods: Beef. Pork. Lamb. Poultry with skin. Hot dogs. [place] Salines. ? Dairy: Ice cream. Sour cream. Whole milk. ? Beverages: Juice. Sugar-sweetened soft drinks. Beer. Liquor and spirits. ? Fats and oils: Butter. Canola oil. Vegetable oil. Beef fat (tallow). Lard. ? Sweets and desserts: Cookies. Cakes. Pies. Candy. ? Seasoning and other foods: Mayonnaise. Premade sauces and marinades. ? The items listed may not be a complete list. Talk with your dietitian about what dietary choices are right for you. Summary  The Mediterranean diet includes both food and lifestyle choices.  Eat a variety of fresh fruits and vegetables, beans, nuts,  seeds, and whole grains.  Limit the amount of red meat and sweets that you eat.  Talk with your health care provider about whether it is safe for you to drink red wine in moderation. This means 1 glass a day for nonpregnant women and 2 glasses a day for men. A glass of wine equals 5 oz (150 mL). This information is not intended to replace advice given to you by your health care provider. Make sure you discuss any questions you have with your health care provider. Document Released: 10/21/2015 Document Revised: 11/23/2015 Document Reviewed: 10/21/2015 Elsevier Interactive Patient Education  2019 [employer].

## 2018-04-09 NOTE — Progress Notes (Signed)
Patient: Danielle Duarte, Female    DOB: 01-01-1961, 58 y.o.   MRN: [id number] Visit Date: 04/09/2018  Today's Provider: Lelon Huh, MD   Chief Complaint  Patient presents with  . Annual Exam  . Hypothyroidism  . Hyperlipidemia  . Asthma   Subjective:    Annual physical exam Danielle Duarte is a 58 y.o. female who presents today for health maintenance and complete physical. She feels fairly well. She reports no regular exercising. She reports she is sleeping fairly well.  -----------------------------------------------------------------   Follow up for Hypothyroidism:  The patient was last seen for this 1 years ago. Changes made at last visit include none.  She reports good compliance with treatment. She feels that condition is stable. She is not having side effects.   ------------------------------------------------------------------------------------   Lipid/Cholesterol, Follow-up:   Last seen for this1 years ago.  Management changes since that visit include counseling patient to work on low saturated fat diet. . Last Lipid Panel:    Component Value Date/Time   CHOL 243 (H) 04/09/2017 0904   TRIG 139 04/09/2017 0904   HDL 70 04/09/2017 0904   CHOLHDL 3.5 04/09/2017 0904   CHOLHDL 3.0 05/22/2011 1005   VLDL 13 05/22/2011 1005   LDLCALC 145 (H) 04/09/2017 0904    Risk factors for vascular disease include hypercholesterolemia  She reports poor compliance with treatment. Patient has not cut back on saturated fats in her diet. She is not having side effects.  Current symptoms include none and have been stable. Weight trend: increasing steadily Prior visit with dietician: no Current diet: in general, an "unhealthy" diet Current exercise: none  Wt Readings from Last 3 Encounters:  04/09/18 194 lb (88 kg)  12/08/17 188 lb (85.3 kg)  12/07/17 186 lb (84.4 kg)    -------------------------------------------------------------------  Follow up for  Asthma:  The patient was last seen for this 1 years ago. Changes made at last visit include none. She is followed by Dr. Benny Lennert and states she is anticipating changing some of her allergy medications, but is currently taking Advair every day. She rarely requires albuterol inhaler.   She reports good compliance with treatment. She feels that condition is Improved. She is not having side effects.   ------------------------------------------------------------------------------------   Review of Systems  Constitutional: Negative for chills, fatigue and fever.  HENT: Negative for congestion, ear pain, rhinorrhea, sneezing and sore throat.   Eyes: Negative.  Negative for pain and redness.  Respiratory: Negative for cough, shortness of breath and wheezing.   Cardiovascular: Negative for chest pain and leg swelling.  Gastrointestinal: Negative for abdominal pain, blood in stool, constipation, diarrhea and nausea.  Endocrine: Negative for polydipsia and polyphagia.  Genitourinary: Negative.  Negative for dysuria, flank pain, hematuria, pelvic pain, vaginal bleeding and vaginal discharge.  Musculoskeletal: Positive for arthralgias and joint swelling. Negative for back pain and gait problem.  Skin: Negative for rash.  Neurological: Negative.  Negative for dizziness, tremors, seizures, weakness, light-headedness, numbness and headaches.  Hematological: Negative for adenopathy.  Psychiatric/Behavioral: Negative.  Negative for behavioral problems, confusion and dysphoric mood. The patient is not nervous/anxious and is not hyperactive.     Social History      She  reports that she quit smoking about 31 years ago. She has never used smokeless tobacco. She reports that she does not drink alcohol or use drugs.       Social History   Socioeconomic History  . Marital status: Divorced  Spouse name: Not on file  . Number of children: Not on file  . Years of education: Not on file  . Highest  education level: Not on file  Occupational History  . Not on file  Social Needs  . Financial resource strain: Not on file  . Food insecurity:    Worry: Not on file    Inability: Not on file  . Transportation needs:    Medical: Not on file    Non-medical: Not on file  Tobacco Use  . Smoking status: Former Smoker    Last attempt to quit: 03/13/1987    Years since quitting: 31.0  . Smokeless tobacco: Never Used  Substance and Sexual Activity  . Alcohol use: No  . Drug use: No  . Sexual activity: Never  Lifestyle  . Physical activity:    Days per week: Not on file    Minutes per session: Not on file  . Stress: Not on file  Relationships  . Social connections:    Talks on phone: Not on file    Gets together: Not on file    Attends religious service: Not on file    Active member of club or organization: Not on file    Attends meetings of clubs or organizations: Not on file    Relationship status: Not on file  Other Topics Concern  . Not on file  Social History Narrative  . Not on file    Past Medical History:  Diagnosis Date  . Abnormal Pap smear   . Arthritis    right knee  . Fibroids   . PONV (postoperative nausea and vomiting)      Patient Active Problem List   Diagnosis Date Noted  . Cervical high risk human papillomavirus (HPV) DNA test positive 11/05/2017  . Vaginal dryness, menopausal 10/25/2017  . Swelling of limb 04/18/2017  . Varicose veins of leg with swelling, 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/18/2017  . 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 Asthma 03/31/2015  . Allergic rhinitis 03/30/2015  . Allergic asthma 03/30/2015  . Direct hernia 03/30/2015  . Edema extremities 03/30/2015  . 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/30/2015  . History of palpitations 03/30/2015  . HLD (hyperlipidemia) 03/30/2015  . BMI 33.0-33.9,adult 03/30/2015  . Beat, premature ventricular 03/30/2015  . Nail deformity 03/30/2015  . Degenerative arthritis of hip  01/14/2015  . Arthritis of knee, degenerative 01/14/2015  . Subclinical hypothyroidism 12/07/2014  . Paroxysmal supraventricular tachycardia ([place]) 01/05/2014  . Breathlessness on exertion 12/16/2013  . Normal cytology on  Pap smear with positive HPV test 06/12/2012  . 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 DUB (dysfunctional uterine bleeding) 12/08/2011    Past Surgical History:  Procedure Laterality Date  . APPENDECTOMY    . COLONOSCOPY  10/10/2010  . COLPOSCOPY  03/01/10  . HERNIA REPAIR  November 2014  . KNEE SURGERY    . LAPAROSCOPIC LYSIS OF ADHESIONS  02/28/2012   Procedure: LAPAROSCOPIC LYSIS OF ADHESIONS;  Surgeon: Osborne Oman, MD;  Location: [facility];  Service: Gynecology;  Laterality: N/A;  . LAPAROSCOPY  02/28/2012   Procedure: LAPAROSCOPY OPERATIVE;  Surgeon: Osborne Oman, MD;  Location: [facility];  Service: Gynecology;  Laterality: N/A;  . SALPINGOOPHORECTOMY  02/28/2012   Procedure: SALPINGO OOPHORECTOMY;  Surgeon: Osborne Oman, MD;  Location: [facility];  Service: Gynecology;  Laterality: Left;  . TOTAL HIP ARTHROPLASTY  04/19/15   Left    Family History  Family Status  Relation Name Status  . Mother  Alive  . Father  Alive  . Annamarie Major  Alive        Her family history includes Diabetes in her paternal uncle; Hypertension in her father and mother.      Allergies  Allergen Reactions  . Dust Mite Extract   . Other Other (See Comments)    Dust mites  . Pollen Extract     from trees and weeds     Current Outpatient Medications:  .  ADVAIR HFA 115-21 MCG/ACT inhaler, , Disp: , Rfl:  .  cetirizine (ZYRTEC) 10 MG tablet, Take 10 mg by mouth daily., Disp: , Rfl:  .  Cholecalciferol (VITAMIN D3) 2000 units TABS, Take 1 Dose by mouth daily., Disp: , Rfl:  .  levothyroxine (SYNTHROID, LEVOTHROID) 75 MCG tablet, Take 1 tablet (75 mcg total) by mouth daily., Disp: 30 tablet, Rfl: 9 .  meloxicam (MOBIC) 15 MG tablet, Take  15 mg by mouth daily as needed for pain., Disp: , Rfl:  .  Multiple Vitamin (MULTIVITAMIN WITH MINERALS) TABS, Take 1 tablet by mouth daily., Disp: , Rfl:  .  PROAIR HFA 108 (90 Base) MCG/ACT inhaler, TAKE 2 PUFFS BY MOUTH EVERY 4 TO 6 HOURS AS NEEDED . MUST LAST 90 DAYS PER MD, Disp: , Rfl: 0  Current Facility-Administered Medications:  .  albuterol (PROVENTIL) (2.5 MG/3ML) 0.083% nebulizer solution 2.5 mg, 2.5 mg, Nebulization, Once, Chrismon, Vickki Muff, PA   Patient Care Team: Birdie Sons, MD as PCP - General (Family Medicine) Tiajuana Amass, MD as Referring Physician (Allergy and Immunology) Earnestine Leys, MD (Specialist) Donnamae Jude, MD as Consulting Physician (Obstetrics and Gynecology)      Objective:   Vitals: BP 138/82 (BP Location: Left Arm, Patient Position: Sitting, Cuff Size: Large)   Pulse 75   Temp 98.4 F (36.9 C) (Oral)   Resp 16   Ht 5\' 5"  (1.651 m)   Wt 194 lb (88 kg)   LMP 12/04/2011   SpO2 95% Comment: room air  BMI 32.28 kg/m    Vitals:   04/09/18 1517  BP: 138/82  Pulse: 75  Resp: 16  Temp: 98.4 F (36.9 C)  TempSrc: Oral  SpO2: 95%  Weight: 194 lb (88 kg)  Height: 5\' 5"  (1.651 m)     Physical Exam   General Appearance:    Alert, cooperative, no distress, appears stated age, overweight  Head:    Normocephalic, without obvious abnormality, atraumatic  Eyes:    PERRL, conjunctiva/corneas clear, EOM's intact, fundi    benign, both eyes  Ears:    Normal TM's  both ears. Skin of canals dry and flaky.   Nose:   Nares normal, septum midline, mucosa normal, no drainage    or sinus tenderness  Throat:   Lips, mucosa, and tongue normal; teeth and gums normal  Neck:   Supple, symmetrical, trachea midline, no adenopathy;    thyroid:  no enlargement/tenderness/nodules; no carotid   bruit or JVD  Back:     Symmetric, no curvature, ROM normal, no CVA tenderness  Lungs:     Clear to auscultation bilaterally, respirations unlabored  Chest Wall:     No tenderness or deformity   Heart:    Regular rate and rhythm, S1 and S2 normal, no murmur, rub   or gallop  Breast Exam:    deferred  Abdomen:     Soft, non-tender, bowel sounds active all four quadrants,  no masses, no organomegaly  Pelvic:    deferred  Extremities:   Extremities normal, atraumatic, no cyanosis or edema  Pulses:   2+ and symmetric all extremities  Skin:   Skin color, texture, turgor normal, no rashes or lesions  Lymph nodes:   Cervical, supraclavicular, and axillary nodes normal  Neurologic:   CNII-XII intact, normal strength, sensation and reflexes    throughout    Depression Screen PHQ 2/9 Scores 04/09/2018 04/06/2017 06/09/2016 03/31/2016  PHQ - 2 Score 0 0 0 0  PHQ- 9 Score 4 2 - 0      Assessment & Plan:     Routine Health Maintenance and Physical Exam  Exercise Activities and Dietary recommendations Goals    . Exercise 150 minutes per week (moderate activity)       Immunization History  Administered Date(s) Administered  . Influenza,inj,Quad PF,6+ Mos 03/30/2015, 02/20/2017  . Influenza-Unspecified 12/12/2015, 12/24/2017  . Tdap 07/19/2015    Health Maintenance  Topic Date Due  . HIV Screening  10/10/1973  . MAMMOGRAM  01/15/2020  . COLONOSCOPY  10/09/2020  . PAP SMEAR-Modifier  10/25/2020  . TETANUS/TDAP  07/18/2025  . INFLUENZA VACCINE  Completed  . Hepatitis C Screening  Completed     Discussed health benefits of physical activity, and encouraged her to engage in regular exercise appropriate for her age and condition.    --------------------------------------------------------------------  1. Annual physical exam Pap/pelvic and breast exam deferred to her gyn. Shingrix deferred due to patient reporting that she had Chicken Pox vaccine when her children were young.  - Comprehensive metabolic panel - Lipid panel  2. BMI 32.0-32.9,adult Counseled regarding prudent diet and regular exercise.   3. Mild persistent extrinsic asthma  without complication Well controlled on maintenance inhaler follow by Dr. Benny Lennert.  - Pneumococcal polysaccharide vaccine 23-valent greater than or equal to 2yo subcutaneous/IM  4. Hypothyroidism, unspecified type  - TSH  5. Hyperlipidemia, unspecified hyperlipidemia type Diet controlled.  - Comprehensive metabolic panel - Lipid panel  6. Seborrheic dermatitis In her ears. She is going to try some OTC ear drops but she can call for prescription for cortisporin if she likes.     Lelon Huh, MD  [facility]

## 2018-04-13 LAB — COMPREHENSIVE METABOLIC PANEL
ALT: 18 IU/L (ref 0–32)
AST: 17 IU/L (ref 0–40)
Albumin/Globulin Ratio: 2 (ref 1.2–2.2)
Albumin: 4.4 g/dL (ref 3.8–4.9)
Alkaline Phosphatase: 96 IU/L (ref 39–117)
BUN/Creatinine Ratio: 18 (ref 9–23)
BUN: 15 mg/dL (ref 6–24)
Bilirubin Total: 0.6 mg/dL (ref 0.0–1.2)
CO2: 22 mmol/L (ref 20–29)
Calcium: 9.3 mg/dL (ref 8.7–10.2)
Chloride: 106 mmol/L (ref 96–106)
Creatinine, Ser: 0.82 mg/dL (ref 0.57–1.00)
GFR calc Af Amer: 91 mL/min/{1.73_m2} (ref >59)
GFR calc non Af Amer: 79 mL/min/{1.73_m2} (ref >59)
Globulin, Total: 2.2 g/dL (ref 1.5–4.5)
Glucose: 82 mg/dL (ref 65–99)
Potassium: 4.3 mmol/L (ref 3.5–5.2)
Sodium: 145 mmol/L — ABNORMAL HIGH (ref 134–144)
Total Protein: 6.6 g/dL (ref 6.0–8.5)

## 2018-04-13 LAB — LIPID PANEL
Chol/HDL Ratio: 3.4 ratio (ref 0.0–4.4)
Cholesterol, Total: 235 mg/dL — ABNORMAL HIGH (ref 100–199)
HDL: 69 mg/dL (ref >39)
LDL Calculated: 145 mg/dL — ABNORMAL HIGH (ref 0–99)
Triglycerides: 106 mg/dL (ref 0–149)
VLDL Cholesterol Cal: 21 mg/dL (ref 5–40)

## 2018-04-13 LAB — TSH: TSH: 1.2 u[IU]/mL (ref 0.450–4.500)

## 2018-04-16 ENCOUNTER — Other Ambulatory Visit: Payer: Self-pay

## 2018-04-16 NOTE — Telephone Encounter (Signed)
Pt would also like a prescription for Seborrheic dermatitis in her ears.     Thanks,   -Mickel Baas

## 2018-04-17 MED ORDER — LEVOTHYROXINE SODIUM 75 MCG PO TABS: 75.0000 ug | ORAL_TABLET | Freq: Every day | ORAL | 11 refills | Status: DC

## 2018-04-17 MED ORDER — NEOMYCIN-POLYMYXIN-HC 3.5-10000-1 OT SUSP: OTIC | 1 refills | Status: DC

## 2018-08-12 DIAGNOSIS — Z96651 Presence of right artificial knee joint: Secondary | ICD-10-CM

## 2018-08-12 HISTORY — DX: Presence of right artificial knee joint: Z96.651

## 2018-08-26 DIAGNOSIS — Z96659 Presence of unspecified artificial knee joint: Secondary | ICD-10-CM

## 2018-08-26 DIAGNOSIS — Z8679 Personal history of other diseases of the circulatory system: Secondary | ICD-10-CM | POA: Insufficient documentation

## 2018-08-26 HISTORY — DX: Presence of unspecified artificial knee joint: Z96.659

## 2018-08-26 HISTORY — PX: TOTAL KNEE ARTHROPLASTY: SHX125

## 2018-09-26 ENCOUNTER — Telehealth: Payer: Self-pay | Admitting: Family Medicine

## 2018-09-26 NOTE — Telephone Encounter (Signed)
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

## 2018-09-26 NOTE — Telephone Encounter (Signed)
Pt has asthma is wanting to know if she is at an increased risk for complications of [id number].  Will she need special accommodations?

## 2018-09-30 NOTE — Telephone Encounter (Signed)
Ok, letter done stating she should work at home.

## 2018-10-01 NOTE — Telephone Encounter (Signed)
Pt advised.   Thanks,   -Laura  

## 2018-12-10 ENCOUNTER — Ambulatory Visit (INDEPENDENT_AMBULATORY_CARE_PROVIDER_SITE_OTHER): Payer: BC Managed Care – PPO

## 2018-12-10 DIAGNOSIS — Z23 Encounter for immunization: Secondary | ICD-10-CM | POA: Diagnosis not present

## 2018-12-20 ENCOUNTER — Other Ambulatory Visit: Payer: Self-pay | Admitting: Family Medicine

## 2018-12-20 DIAGNOSIS — Z1231 Encounter for screening mammogram for malignant neoplasm of breast: Secondary | ICD-10-CM

## 2019-02-14 ENCOUNTER — Ambulatory Visit
Admission: RE | Admit: 2019-02-14 | Discharge: 2019-02-14 | Disposition: A | Discharge status: Home / Self Care | Payer: BC Managed Care – PPO | Source: Ambulatory Visit | Attending: Family Medicine | Admitting: Family Medicine

## 2019-02-14 ENCOUNTER — Other Ambulatory Visit: Payer: Self-pay

## 2019-02-14 DIAGNOSIS — Z1231 Encounter for screening mammogram for malignant neoplasm of breast: Secondary | ICD-10-CM

## 2019-03-13 ENCOUNTER — Telehealth: Payer: Self-pay

## 2019-03-13 ENCOUNTER — Ambulatory Visit (INDEPENDENT_AMBULATORY_CARE_PROVIDER_SITE_OTHER): Payer: BC Managed Care – PPO | Admitting: Family Medicine

## 2019-03-13 ENCOUNTER — Encounter: Payer: Self-pay | Admitting: Family Medicine

## 2019-03-13 ENCOUNTER — Other Ambulatory Visit: Payer: Self-pay

## 2019-03-13 DIAGNOSIS — Z01419 Encounter for gynecological examination (general) (routine) without abnormal findings: Secondary | ICD-10-CM

## 2019-03-13 DIAGNOSIS — Z1151 Encounter for screening for human papillomavirus (HPV): Secondary | ICD-10-CM

## 2019-03-13 DIAGNOSIS — Z124 Encounter for screening for malignant neoplasm of cervix: Secondary | ICD-10-CM

## 2019-03-13 NOTE — Telephone Encounter (Signed)
Copied from [place] [phone]. Topic: Appointment Scheduling - Scheduling Inquiry for Clinic >> Mar 13, 2019  9:39 AM Mathis Bud wrote: Reason for CRM: Patient had last CPE on 04/09/18.  Patient would like CPE around the same time.  PCP has no appts until Feb 22nd.  Patient would like to schedule around the same time. Call back [phone]

## 2019-03-13 NOTE — Progress Notes (Signed)
  Subjective:     Danielle Duarte is a 58 y.o. female and is here for a comprehensive physical exam. The patient reports no problems. 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 H/o HPV pos pap last year with Neg 16/18/45. Asks about this. Neg HPV the year before.  The following portions of the patient's history were reviewed and updated as appropriate: allergies, current medications, past family history, past medical history, past social history, past surgical history and problem list.  Review of Systems Pertinent items noted in HPI and remainder of comprehensive ROS otherwise negative.   Objective:    BP 134/85   Pulse 77   Ht 5\' 5"  (1.651 m)   Wt 206 lb (93.4 kg)   LMP 12/04/2011   BMI 34.28 kg/m  General appearance: alert, cooperative and appears stated age Head: Normocephalic, without obvious abnormality, atraumatic Neck: no adenopathy, supple, symmetrical, trachea midline and thyroid not enlarged, symmetric, no tenderness/mass/nodules Lungs: clear to auscultation bilaterally Breasts: normal appearance, no masses or tenderness Heart: regular rate and rhythm, S1, S2 normal, no murmur, click, rub or gallop Abdomen: soft, non-tender; bowel sounds normal; no masses,  no organomegaly Pelvic: cervix normal in appearance, external genitalia normal, no adnexal masses or tenderness, no cervical motion tenderness, uterus normal size, shape, and consistency and vagina with atrophy Extremities: Homans sign is negative, no sign of DVT Pulses: 2+ and symmetric Skin: Skin color, texture, turgor normal. No rashes or lesions Lymph nodes: Cervical, supraclavicular, and axillary nodes normal. Neurologic: Grossly normal    Assessment:    Healthy female exam.      Plan:      Problem List Items Addressed This Visit    None    Visit Diagnoses    Screening for malignant neoplasm of cervix       Relevant Orders   PAP over 30   Encounter for gynecological examination without abnormal  finding       Relevant Orders   Mammogram Screening Bilateral Tomo     Return in 1 year (on 03/12/2020).  See After Visit Summary for Counseling Recommendations

## 2019-03-13 NOTE — Patient Instructions (Signed)

## 2019-03-13 NOTE — Telephone Encounter (Signed)
She'll have to wait until the next available CPE slot or see a PA if she has to have a CPE sooner.

## 2019-03-17 LAB — CYTOLOGY - PAP
Comment: NEGATIVE
Diagnosis: NEGATIVE
High risk HPV: NEGATIVE

## 2019-03-17 NOTE — Telephone Encounter (Signed)
Called and spoke with patient and scheduled her for the next available physical appointment and placed her on a wait list for sooner appointment.

## 2019-03-24 DIAGNOSIS — D239 Other benign neoplasm of skin, unspecified: Secondary | ICD-10-CM

## 2019-03-24 HISTORY — DX: Other benign neoplasm of skin, unspecified: D23.9

## 2019-04-03 ENCOUNTER — Telehealth: Payer: Self-pay | Admitting: Family Medicine

## 2019-04-03 DIAGNOSIS — E039 Hypothyroidism, unspecified: Secondary | ICD-10-CM

## 2019-04-03 MED ORDER — LEVOTHYROXINE SODIUM 75 MCG PO TABS: 75.0000 ug | ORAL_TABLET | Freq: Every day | ORAL | 1 refills | Status: DC

## 2019-04-03 NOTE — Telephone Encounter (Signed)
Medication Refill - Medication:  levothyroxine (SYNTHROID, LEVOTHROID) 75 MCG tablet [id number]  appt is at the end of feb but meds are going to run out before then   Has the patient contacted their pharmacy? No. (Agent: If no, request that the patient contact the pharmacy for the refill.) (Agent: If yes, when and what did the pharmacy advise?)  Preferred Pharmacy (with phone number or street name): [facility] [address]   Agent: Please be advised that RX refills may take up to 3 business days. We ask that you follow-up with your pharmacy.

## 2019-04-06 ENCOUNTER — Other Ambulatory Visit: Payer: Self-pay | Admitting: Family Medicine

## 2019-04-06 DIAGNOSIS — E039 Hypothyroidism, unspecified: Secondary | ICD-10-CM

## 2019-04-17 ENCOUNTER — Other Ambulatory Visit: Payer: Self-pay | Admitting: Family Medicine

## 2019-04-17 DIAGNOSIS — Z01419 Encounter for gynecological examination (general) (routine) without abnormal findings: Secondary | ICD-10-CM

## 2019-05-06 ENCOUNTER — Other Ambulatory Visit: Payer: Self-pay

## 2019-05-06 ENCOUNTER — Ambulatory Visit (INDEPENDENT_AMBULATORY_CARE_PROVIDER_SITE_OTHER): Payer: BC Managed Care – PPO | Admitting: Family Medicine

## 2019-05-06 ENCOUNTER — Encounter: Payer: Self-pay | Admitting: Family Medicine

## 2019-05-06 VITALS — BP 140/90 | HR 72 | Temp 97.7°F | Resp 16 | Ht 65.0 in | Wt 206.0 lb

## 2019-05-06 DIAGNOSIS — Z6834 Body mass index (BMI) 34.0-34.9, adult: Secondary | ICD-10-CM

## 2019-05-06 DIAGNOSIS — E785 Hyperlipidemia, unspecified: Secondary | ICD-10-CM | POA: Diagnosis not present

## 2019-05-06 DIAGNOSIS — E039 Hypothyroidism, unspecified: Secondary | ICD-10-CM | POA: Diagnosis not present

## 2019-05-06 DIAGNOSIS — E669 Obesity, unspecified: Secondary | ICD-10-CM

## 2019-05-06 DIAGNOSIS — Z0000 Encounter for general adult medical examination without abnormal findings: Secondary | ICD-10-CM

## 2019-05-06 DIAGNOSIS — Z136 Encounter for screening for cardiovascular disorders: Secondary | ICD-10-CM | POA: Diagnosis not present

## 2019-05-06 NOTE — Patient Instructions (Signed)
.   Please review the attached list of medications and notify my office if there are any errors.    Please bring all of your medications to every appointment so we can make sure that our medication list is the same as yours.   . Please go to the lab draw station in [address] on the second floor of [facility]  when you are fasting for 8 hours. Normal hours are 8:00am to 12:30pm and 1:30pm to 4:00pm Monday through Friday     

## 2019-05-06 NOTE — Progress Notes (Signed)
Patient: Danielle Duarte, Female    DOB: 1960/10/21, 59 y.o.   MRN: [id number] Visit Date: 05/06/2019  Today's Provider: Lelon Huh, MD   Chief Complaint  Patient presents with  . Annual Exam  . Asthma  . Hypothyroidism  . Hyperlipidemia   Subjective:     Annual physical exam Danielle Duarte is a 59 y.o. female who presents today for health maintenance and complete physical. She feels fairly well. She reports exercising daily (walking). She reports she is sleeping fairly well.  She had her annual gyn exam by Dr. Kennon Rounds in December  -----------------------------------------------------------------  Follow up for Asthma:   The patient was last seen for this 1 year ago. Changes made at last visit include none. This problem is followed by Dr. Benny Lennert.  She reports good compliance with treatment. She feels that condition is stable. She is not having side effects.   ------------------------------------------------------------------------------------  Follow up for Hypothyroidism:  The patient was last seen for this 1 year ago. Changes made at last visit include none.  She reports good compliance with treatment. She feels that condition is stable. She is not having side effects.   ------------------------------------------------------------------------------------  Lipid/Cholesterol, Follow-up:   Last seen for this1 year ago.  Management changes since that visit include none. . Last Lipid Panel:    Component Value Date/Time   CHOL 235 (H) 04/12/2018 0950   TRIG 106 04/12/2018 0950   HDL 69 04/12/2018 0950   CHOLHDL 3.4 04/12/2018 0950   CHOLHDL 3.0 05/22/2011 1005   VLDL 13 05/22/2011 1005   LDLCALC 145 (H) 04/12/2018 0950    Risk factors for vascular disease include hypercholesterolemia  She reports good compliance with treatment. She is not having side effects.  Current symptoms include none  Weight trend: stable Prior visit with dietician:  no Current diet: well balanced Current exercise: walking  Wt Readings from Last 3 Encounters:  05/06/19 206 lb (93.4 kg)  03/13/19 206 lb (93.4 kg)  04/09/18 194 lb (88 kg)    -------------------------------------------------------------------   Review of Systems  Constitutional: Negative for chills, fatigue and fever.  HENT: Negative for congestion, ear pain, rhinorrhea, sneezing and sore throat.   Eyes: Negative.  Negative for pain and redness.  Respiratory: Negative for cough, shortness of breath and wheezing.   Cardiovascular: Negative for chest pain and leg swelling.  Gastrointestinal: Negative for abdominal pain, blood in stool, constipation, diarrhea and nausea.  Endocrine: Negative for polydipsia and polyphagia.  Genitourinary: Negative.  Negative for dysuria, flank pain, hematuria, pelvic pain, vaginal bleeding and vaginal discharge.  Musculoskeletal: Negative for arthralgias, back pain, gait problem and joint swelling.  Skin: Negative for rash.  Neurological: Negative.  Negative for dizziness, tremors, seizures, weakness, light-headedness, numbness and headaches.  Hematological: Negative for adenopathy.  Psychiatric/Behavioral: Negative.  Negative for behavioral problems, confusion and dysphoric mood. The patient is not nervous/anxious and is not hyperactive.     Social History      She  reports that she quit smoking about 32 years ago. She has never used smokeless tobacco. She reports that she does not drink alcohol or use drugs.       Social History   Socioeconomic History  . Marital status: Divorced    Spouse name: Not on file  . Number of children: Not on file  . Years of education: Not on file  . Highest education level: Not on file  Occupational History  . Not on file  Tobacco  Use  . Smoking status: Former Smoker    Quit date: 03/13/1987    Years since quitting: 32.1  . Smokeless tobacco: Never Used  Substance and Sexual Activity  . Alcohol use: No  .  Drug use: No  . Sexual activity: Never  Other Topics Concern  . Not on file  Social History Narrative  . Not on file   Social Determinants of Health   Financial Resource Strain:   . Difficulty of Paying Living Expenses: Not on file  Food Insecurity:   . Worried About Charity fundraiser in the Last Year: Not on file  . Ran Out of Food in the Last Year: Not on file  Transportation Needs:   . Lack of Transportation (Medical): Not on file  . Lack of Transportation (Non-Medical): Not on file  Physical Activity:   . Days of Exercise per Week: Not on file  . Minutes of Exercise per Session: Not on file  Stress:   . Feeling of Stress : Not on file  Social Connections:   . Frequency of Communication with Friends and Family: Not on file  . Frequency of Social Gatherings with Friends and Family: Not on file  . Attends Religious Services: Not on file  . Active Member of Clubs or Organizations: Not on file  . Attends Archivist Meetings: Not on file  . Marital Status: Not on file    Past Medical History:  Diagnosis Date  . Abnormal Pap smear   . Arthritis    right knee  . Fibroids   . PONV (postoperative nausea and vomiting)      Patient Active Problem List   Diagnosis Date Noted  . History of PSVT (paroxysmal supraventricular tachycardia) 08/26/2018  . Immune to varicella 04/09/2018  . Cervical high risk human papillomavirus (HPV) DNA test positive 11/05/2017  . Vaginal dryness, menopausal 10/25/2017  . Swelling of limb 04/18/2017  . Varicose veins of leg with swelling, 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/18/2017  . 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 Allergic rhinitis 03/30/2015  . Allergic asthma 03/30/2015  . Direct hernia 03/30/2015  . Edema extremities 03/30/2015  . 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/30/2015  . History of palpitations 03/30/2015  . HLD (hyperlipidemia) 03/30/2015  . BMI 33.0-33.9,adult 03/30/2015  . Beat, premature  ventricular 03/30/2015  . Nail deformity 03/30/2015  . Degenerative arthritis of hip 01/14/2015  . Arthritis of knee, degenerative 01/14/2015  . Hypothyroid 12/07/2014  . Paroxysmal supraventricular tachycardia ([place]) 01/05/2014  . Normal cytology on  Pap smear with positive HPV test 06/12/2012  . 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 DUB (dysfunctional uterine bleeding) 12/08/2011    Past Surgical History:  Procedure Laterality Date  . APPENDECTOMY    . COLONOSCOPY  10/10/2010  . COLPOSCOPY  03/01/10  . HERNIA REPAIR  November 2014  . KNEE SURGERY    . LAPAROSCOPIC LYSIS OF ADHESIONS  02/28/2012   Procedure: LAPAROSCOPIC LYSIS OF ADHESIONS;  Surgeon: Osborne Oman, MD;  Location: [facility];  Service: Gynecology;  Laterality: N/A;  . LAPAROSCOPY  02/28/2012   Procedure: LAPAROSCOPY OPERATIVE;  Surgeon: Osborne Oman, MD;  Location: [facility];  Service: Gynecology;  Laterality: N/A;  . SALPINGOOPHORECTOMY  02/28/2012   Procedure: SALPINGO OOPHORECTOMY;  Surgeon: Osborne Oman, MD;  Location: [facility];  Service: Gynecology;  Laterality: Left;  . TOTAL HIP ARTHROPLASTY  04/19/15   Left    Family History  Family Status  Relation Name Status  . Mother  Alive  . Father  Alive  . Annamarie Major  Alive        Her family history includes Diabetes in her paternal uncle; Hypertension in her father and mother.      Allergies  Allergen Reactions  . Dust Mite Extract   . Other Other (See Comments)    Dust mites  . Pollen Extract     from trees and weeds     Current Outpatient Medications:  .  ADVAIR HFA 115-21 MCG/ACT inhaler, , Disp: , Rfl:  .  azelastine (ASTELIN) 0.1 % nasal spray, Place into both nostrils 2 (two) times daily. Use in each nostril as directed, Disp: , Rfl:  .  fexofenadine (ALLEGRA) 180 MG tablet, Take 180 mg by mouth daily., Disp: , Rfl:  .  levothyroxine (SYNTHROID) 75 MCG tablet, TAKE 1 TABLET BY MOUTH EVERY DAY, Disp: 90  tablet, Rfl: 3 .  Multiple Vitamin (MULTIVITAMIN WITH MINERALS) TABS, Take 1 tablet by mouth daily., Disp: , Rfl:  .  PROAIR HFA 108 (90 Base) MCG/ACT inhaler, TAKE 2 PUFFS BY MOUTH EVERY 4 TO 6 HOURS AS NEEDED . MUST LAST 90 DAYS PER MD, Disp: , Rfl: 0   Patient Care Team: Birdie Sons, MD as PCP - General (Family Medicine) Tiajuana Amass, MD as Referring Physician (Allergy and Immunology) Earnestine Leys, MD (Specialist) Donnamae Jude, MD as Consulting Physician (Obstetrics and Gynecology)    Objective:    Vitals: BP 140/90 (BP Location: Right Arm, Cuff Size: Large)   Pulse 72   Temp 97.7 F (36.5 C) (Temporal)   Resp 16   Ht 5\' 5"  (1.651 m)   Wt 206 lb (93.4 kg)   LMP 12/04/2011   SpO2 97% Comment: room air  BMI 34.28 kg/m    Vitals:   05/06/19 1507 05/06/19 1511  BP: (!) 144/90 140/90  Pulse: 72   Resp: 16   Temp: 97.7 F (36.5 C)   TempSrc: Temporal   SpO2: 97%   Weight: 206 lb (93.4 kg)   Height: 5\' 5"  (1.651 m)      Physical Exam   General Appearance:    Obese female. Alert, cooperative, in no acute distress, appears stated age   Head:    Normocephalic, without obvious abnormality, atraumatic  Eyes:    PERRL, conjunctiva/corneas clear, EOM's intact, fundi    benign, both eyes  Ears:    Normal TM's and external ear canals, both ears  Nose:   Nares normal, septum midline, mucosa normal, no drainage    or sinus tenderness  Throat:   Lips, mucosa, and tongue normal; teeth and gums normal  Neck:   Supple, symmetrical, trachea midline, no adenopathy;    thyroid:  no enlargement/tenderness/nodules; no carotid   bruit or JVD  Back:     Symmetric, no curvature, ROM normal, no CVA tenderness  Lungs:     Clear to auscultation bilaterally, respirations unlabored  Chest Wall:    No tenderness or deformity   Heart:    Normal heart rate. Normal rhythm. No murmurs, rubs, or gallops.   Breast Exam:    deferred  Abdomen:     Soft, non-tender, bowel sounds active all  four quadrants,    no masses, no organomegaly  Pelvic:    deferred  Extremities:   All extremities are intact. No cyanosis or edema  Pulses:   2+ and symmetric all extremities  Skin:  Skin color, texture, turgor normal, no rashes or lesions  Lymph nodes:   Cervical, supraclavicular, and axillary nodes normal  Neurologic:   CNII-XII intact, normal strength, sensation and reflexes    throughout    Depression Screen PHQ 2/9 Scores 05/06/2019 04/09/2018 04/06/2017 06/09/2016  PHQ - 2 Score 0 0 0 0  PHQ- 9 Score 0 4 2 -       Assessment & Plan:     Routine Health Maintenance and Physical Exam  Exercise Activities and Dietary recommendations Goals    . Exercise 150 minutes per week (moderate activity)       Immunization History  Administered Date(s) Administered  . Influenza,inj,Quad PF,6+ Mos 03/30/2015, 02/20/2017, 12/10/2018  . Influenza-Unspecified 12/12/2015, 12/24/2017  . Pneumococcal Polysaccharide-23 04/09/2018  . Tdap 07/19/2015    Health Maintenance  Topic Date Due  . HIV Screening  10/10/1973  . COLONOSCOPY  10/09/2020  . MAMMOGRAM  02/13/2021  . PAP SMEAR-Modifier  03/12/2022  . TETANUS/TDAP  07/18/2025  . INFLUENZA VACCINE  Completed  . Hepatitis C Screening  Completed     Discussed health benefits of physical activity, and encouraged her to engage in regular exercise appropriate for her age and condition.    --------------------------------------------------------------------  1. Annual physical exam Generally doing well, unremarkable exam. Gyn and breast exam deferred to Dr. Kennon Rounds  2. Hypothyroidism, unspecified type Some weight gain noted, otherwise currently asymptomatic.  - TSH  3. Hyperlipidemia, unspecified hyperlipidemia type Diet controlled.  - Comprehensive metabolic panel - Lipid panel  4. Encounter for special screening examination for cardiovascular disorder  - EKG 12-Lead  5. Class 1 obesity with body mass index (BMI) of 34.0 to  34.9 in adult, unspecified obesity type, unspecified whether serious comorbidity present Encouraged reducing calorie intake and regular exercise.   The entirety of the information documented in the History of Present Illness, Review of Systems and Physical Exam were personally obtained by me. Portions of this information were initially documented by Meyer Cory, CMA and reviewed by me for thoroughness and accuracy.    Lelon Huh, MD  [facility]

## 2019-05-10 LAB — COMPREHENSIVE METABOLIC PANEL
ALT: 23 IU/L (ref 0–32)
AST: 23 IU/L (ref 0–40)
Albumin/Globulin Ratio: 2.4 — ABNORMAL HIGH (ref 1.2–2.2)
Albumin: 4.5 g/dL (ref 3.8–4.9)
Alkaline Phosphatase: 104 IU/L (ref 39–117)
BUN/Creatinine Ratio: 14 (ref 12–28)
BUN: 11 mg/dL (ref 8–27)
Bilirubin Total: 0.7 mg/dL (ref 0.0–1.2)
CO2: 22 mmol/L (ref 20–29)
Calcium: 9.2 mg/dL (ref 8.7–10.3)
Chloride: 103 mmol/L (ref 96–106)
Creatinine, Ser: 0.77 mg/dL (ref 0.57–1.00)
GFR calc Af Amer: 97 mL/min/{1.73_m2} (ref >59)
GFR calc non Af Amer: 84 mL/min/{1.73_m2} (ref >59)
Globulin, Total: 1.9 g/dL (ref 1.5–4.5)
Glucose: 85 mg/dL (ref 65–99)
Potassium: 4.2 mmol/L (ref 3.5–5.2)
Sodium: 141 mmol/L (ref 134–144)
Total Protein: 6.4 g/dL (ref 6.0–8.5)

## 2019-05-10 LAB — LIPID PANEL
Chol/HDL Ratio: 3.3 ratio (ref 0.0–4.4)
Cholesterol, Total: 222 mg/dL — ABNORMAL HIGH (ref 100–199)
HDL: 67 mg/dL (ref >39)
LDL Chol Calc (NIH): 136 mg/dL — ABNORMAL HIGH (ref 0–99)
Triglycerides: 110 mg/dL (ref 0–149)
VLDL Cholesterol Cal: 19 mg/dL (ref 5–40)

## 2019-05-10 LAB — TSH: TSH: 1.63 u[IU]/mL (ref 0.450–4.500)

## 2019-05-12 ENCOUNTER — Encounter: Payer: Self-pay | Admitting: Family Medicine

## 2019-05-13 ENCOUNTER — Encounter: Payer: Self-pay | Admitting: Family Medicine

## 2019-05-13 DIAGNOSIS — E039 Hypothyroidism, unspecified: Secondary | ICD-10-CM

## 2019-05-13 MED ORDER — LEVOTHYROXINE SODIUM 75 MCG PO TABS: 75.0000 ug | ORAL_TABLET | Freq: Every day | ORAL | 3 refills | Status: DC

## 2019-05-19 ENCOUNTER — Encounter: Payer: Self-pay | Admitting: Family Medicine

## 2019-05-19 ENCOUNTER — Ambulatory Visit (INDEPENDENT_AMBULATORY_CARE_PROVIDER_SITE_OTHER): Payer: BC Managed Care – PPO | Admitting: Family Medicine

## 2019-05-19 ENCOUNTER — Ambulatory Visit: Payer: Self-pay | Admitting: *Deleted

## 2019-05-19 VITALS — BP 149/90 | Temp 96.2°F

## 2019-05-19 DIAGNOSIS — R42 Dizziness and giddiness: Secondary | ICD-10-CM | POA: Diagnosis not present

## 2019-05-19 NOTE — Progress Notes (Signed)
Patient: Danielle Duarte Female    DOB: [birth year]   59 y.o.   MRN: [id number] Visit Date: 05/19/2019  Today's Provider: Lelon Huh, MD   Chief Complaint  Patient presents with  . Dizziness   Subjective:    Virtual Visit via Video Note  I connected with [place] on 05/19/19 at  3:40 PM EST by a video enabled telemedicine application and verified that I am speaking with the correct person using two identifiers.  Location: Patient: home  Provider: bfp    I discussed the limitations of evaluation and management by telemedicine and the availability of in person appointments. The patient expressed understanding and agreed to proceed.    Dizziness This is a new problem. Episode onset: 4 days ago. The problem has been gradually improving. Pertinent negatives include no abdominal pain, chest pain, chills, fatigue, fever, nausea, vomiting or weakness. She has tried acetaminophen (meclizine) for the symptoms.  Patient states she was exposed to flu and COVID last week. She was seen at an Urgent care where they did Rapid test; results were negative. Patient was prescribed Meclizine. She states meclizine provides minimal relief and makes her tired. She states she gets spinning sensation when she sits up or lays back quickly and sometimes when when turning head. Is not light headed. No headaches. Slightly nauseated when she gets dizzy. No   Allergies  Allergen Reactions  . Dust Mite Extract   . Other Other (See Comments)    Dust mites  . Pollen Extract     from trees and weeds     Current Outpatient Medications:  .  ADVAIR HFA 115-21 MCG/ACT inhaler, , Disp: , Rfl:  .  azelastine (ASTELIN) 0.1 % nasal spray, Place into both nostrils 2 (two) times daily. Use in each nostril as directed, Disp: , Rfl:  .  fexofenadine (ALLEGRA) 180 MG tablet, Take 180 mg by mouth daily., Disp: , Rfl:  .  levothyroxine (SYNTHROID) 75 MCG tablet, Take 1 tablet (75 mcg total) by mouth daily.,  Disp: 90 tablet, Rfl: 3 .  Multiple Vitamin (MULTIVITAMIN WITH MINERALS) TABS, Take 1 tablet by mouth daily., Disp: , Rfl:  .  PROAIR HFA 108 (90 Base) MCG/ACT inhaler, TAKE 2 PUFFS BY MOUTH EVERY 4 TO 6 HOURS AS NEEDED . MUST LAST 90 DAYS PER MD, Disp: , Rfl: 0  Review of Systems  Constitutional: Negative for appetite change, chills, fatigue and fever.  HENT: Positive for sinus pressure.   Eyes: Positive for pain (pressure around eyes).  Respiratory: Negative for chest tightness and shortness of breath.   Cardiovascular: Negative for chest pain and palpitations.  Gastrointestinal: Negative for abdominal pain, nausea and vomiting.  Neurological: Positive for dizziness. Negative for weakness.    Social History   Tobacco Use  . Smoking status: Former Smoker    Quit date: 03/13/1987    Years since quitting: 32.2  . Smokeless tobacco: Never Used  Substance Use Topics  . Alcohol use: No      Objective:   BP (!) 149/90   Temp (!) 96.2 F (35.7 C) (Temporal)   LMP 12/04/2011  Vitals:   05/19/19 1120  BP: (!) 149/90  Temp: (!) 96.2 F (35.7 C)  TempSrc: Temporal  There is no height or weight on file to calculate BMI.   Physical Exam  Awake, alert, oriented x 3. In no apparent distress Exam limited by telehealth limitations.      Assessment & Plan  1. Vertigo By history if consistently with BPV. Discussed various etiologies of this condition and typical self limited course. She can continue prn meclizine and sent information regarding Epley maneuvers. Counseled that we could set up ENT referral if not improving in the next week to 10 days.      I discussed the assessment and treatment plan with the patient. The patient was provided an opportunity to ask questions and all were answered. The patient agreed with the plan and demonstrated an understanding of the instructions.   The patient was advised to call back or seek an in-person evaluation if the symptoms worsen or  if the condition fails to improve as anticipated.  I provided 11 minutes of non-face-to-face time during this encounter. Lelon Huh, MD  [facility]

## 2019-05-19 NOTE — Telephone Encounter (Signed)
Dizziness since Thursday- it has improved some. 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 Patient states she is having sinus pressure in her face under eye. Follow up appointment with PCP made to review patient concerns.  Reason for Disposition . [1] MILD dizziness (e.g., walking normally) AND [2] has been evaluated by physician for this  Answer Assessment - Initial Assessment Questions 1. DESCRIPTION: "Describe your dizziness."     Patient is dizzy standing- "floaty" 2. LIGHTHEADED: "Do you feel lightheaded?" (e.g., somewhat faint, woozy, weak upon standing)     Patient thought she was having reaction to COVID vaccine- unsteady- patient was seen at [facility]/covid due to exposure to both. Patient is on quarantine presently. 3. VERTIGO: "Do you feel like either you or the room is spinning or tilting?" (i.e. vertigo)     Some spinning at the beginning 4. SEVERITY: "How bad is it?"  "Do you feel like you are going to faint?" "Can you stand and walk?"   - MILD - walking normally   - MODERATE - interferes with normal activities (e.g., work, school)    - SEVERE - unable to stand, requires support to walk, feels like passing out now.      Mild now 5. ONSET:  "When did the dizziness begin?"     Thursday 6. AGGRAVATING FACTORS: "Does anything make it worse?" (e.g., standing, change in head position)     Changing positions- moving haed 7. HEART RATE: "Can you tell me your heart rate?" "How many beats in 15 seconds?"  (Note: not all patients can do this)       No changes per patient 8. CAUSE: "What do you think is causing the dizziness?"     Not sure- UC checked her out and she was given Rx for meclizine 9. RECURRENT SYMPTOM:  "Have you had dizziness before?" If so, ask: "When was the last time?" "What happened that time?"     Never had before 10. OTHER SYMPTOMS: "Do you have any other symptoms?" (e.g., fever, chest pain, vomiting, diarrhea, bleeding)       no 11. PREGNANCY: "Is there any chance you are pregnant?" "When was your last menstrual period?"       n/a  Protocols used: DIZZINESS [facility]

## 2019-05-22 ENCOUNTER — Encounter: Payer: Self-pay | Admitting: Family Medicine

## 2019-05-22 ENCOUNTER — Ambulatory Visit: Payer: BC Managed Care – PPO | Attending: Internal Medicine

## 2019-05-22 DIAGNOSIS — R42 Dizziness and giddiness: Secondary | ICD-10-CM

## 2019-05-22 DIAGNOSIS — Z20822 Contact with and (suspected) exposure to covid-19: Secondary | ICD-10-CM

## 2019-05-23 LAB — NOVEL CORONAVIRUS, NAA: SARS-CoV-2, NAA: NOT DETECTED

## 2019-05-28 ENCOUNTER — Encounter: Payer: Self-pay | Admitting: Family Medicine

## 2019-05-29 ENCOUNTER — Telehealth: Payer: Self-pay | Admitting: Family Medicine

## 2019-05-29 NOTE — Telephone Encounter (Signed)
Patient states she is trying to get a referral to ENT. Patient referral is pending. Patient called [facility] and office states they did not have referral. Patient would like a call back asap when referral has appt. Patient would like our office to make the appt for her if possible.  Patient states she has been trying to get referral for a week  Call back [phone]

## 2019-06-09 ENCOUNTER — Telehealth: Payer: Self-pay | Admitting: Family Medicine

## 2019-06-09 NOTE — Telephone Encounter (Signed)
Patient would like to know if Dr. Caryn Section would advise she keep her 2nd covid vaccine appointment for tomorrow, as she feels the first dose caused her to experience vertigo.

## 2019-06-10 NOTE — Telephone Encounter (Signed)
Patient advised and verbalized understanding 

## 2019-06-10 NOTE — Telephone Encounter (Signed)
Yes, she should still get the second vaccine. It may be an inflammatory reaction, so if she starts to feel like vertigo is flaring up she could take 2 ibuprofen every eight hours. Can also take OTC meclizine as needed.

## 2019-09-30 NOTE — Progress Notes (Signed)
     Established patient visit   Patient: Danielle Duarte   DOB: 16-Sep-1960   59 y.o. Female  MRN: [id number] Visit Date: 10/01/2019  Today's healthcare provider: Trinna Post, PA-C   Chief Complaint  Patient presents with  . swelling in ankles   Subjective    HPI   Leg Swelling Patient presents today for leg swelling. She reports that it is mainly in her ankles. More on the right than the left. She reports that this comes and goes. She denies any pain. She has not used any compression stockings. She denies shortness of breath, chest pain, or dizziness. She reports that her symptoms does get better at night. Patient saw [facility] in 2019. She underwent ultrasound which revealed right femoral reflux and left greater saphenous reflux. She was advised to wear compression stockings. She denies chest pain and SOB. She does have a history of remote right foot fracture and wonders if this could be causing the swelling. She denies pain or difficulty walking.       Medications: Outpatient Medications Prior to Visit  Medication Sig  . ADVAIR HFA 115-21 MCG/ACT inhaler   . azelastine (ASTELIN) 0.1 % nasal spray Place into both nostrils 2 (two) times daily. Use in each nostril as directed  . fexofenadine (ALLEGRA) 180 MG tablet Take 180 mg by mouth daily.  Marland Kitchen levothyroxine (SYNTHROID) 75 MCG tablet Take 1 tablet (75 mcg total) by mouth daily.  . Multiple Vitamin (MULTIVITAMIN WITH MINERALS) TABS Take 1 tablet by mouth daily.  Marland Kitchen PROAIR HFA 108 (90 Base) MCG/ACT inhaler TAKE 2 PUFFS BY MOUTH EVERY 4 TO 6 HOURS AS NEEDED . MUST LAST 90 DAYS PER MD   No facility-administered medications prior to visit.    Review of Systems  Constitutional: Negative.   Cardiovascular: Positive for leg swelling.  Hematological: Negative.       Objective    Temp 98 F (36.7 C)   Ht 5\' 5"  (1.651 m)   Wt 201 lb 12.8 oz (91.5 kg)   LMP 12/04/2011   BMI 33.58 kg/m    Physical  Exam Pulmonary:     Effort: Pulmonary effort is normal.  Musculoskeletal:     Right lower leg: Edema present.     Left lower leg: Edema present.     Comments: Mild bilateral edema, right > left. Cap refill < 3 seconds. No pain or tenderness bilateral extremities.   Skin:    General: Skin is warm and dry.  Neurological:     Mental Status: She is alert and oriented to person, place, and time. Mental status is at baseline.  Psychiatric:        Mood and Affect: Mood normal.        Behavior: Behavior normal.      No results found for any visits on 10/01/19.  Assessment & Plan    1. Venous insufficiency  Advised I think her swelling is due to venous insufficiency, especially considering her ultrasound findings. Advised to wear compression stockings.       ITrinna Post, PA-C, have reviewed all documentation for this visit. The documentation on 10/02/19 for the exam, diagnosis, procedures, and orders are all accurate and complete.    Paulene Floor  [facility] [phone] (phone) [phone] (fax)  [place]

## 2019-10-01 ENCOUNTER — Other Ambulatory Visit: Payer: Self-pay

## 2019-10-01 ENCOUNTER — Ambulatory Visit: Payer: BC Managed Care – PPO | Admitting: Physician Assistant

## 2019-10-01 ENCOUNTER — Encounter: Payer: Self-pay | Admitting: Physician Assistant

## 2019-10-01 VITALS — BP 143/84 | HR 62 | Temp 98.0°F | Ht 65.0 in | Wt 201.8 lb

## 2019-10-01 DIAGNOSIS — I872 Venous insufficiency (chronic) (peripheral): Secondary | ICD-10-CM | POA: Diagnosis not present

## 2019-10-01 NOTE — Patient Instructions (Addendum)
Recommend wearing compression stockings in the morning, and remove at bedtime. [place] from [place] is a great brand.

## 2019-10-06 ENCOUNTER — Other Ambulatory Visit: Payer: Self-pay

## 2019-10-06 ENCOUNTER — Ambulatory Visit (INDEPENDENT_AMBULATORY_CARE_PROVIDER_SITE_OTHER): Payer: BC Managed Care – PPO | Admitting: Obstetrics & Gynecology

## 2019-10-06 ENCOUNTER — Encounter: Payer: Self-pay | Admitting: Obstetrics & Gynecology

## 2019-10-06 VITALS — BP 150/88 | HR 72 | Wt 200.6 lb

## 2019-10-06 DIAGNOSIS — N9089 Other specified noninflammatory disorders of vulva and perineum: Secondary | ICD-10-CM | POA: Diagnosis not present

## 2019-10-06 NOTE — Patient Instructions (Signed)
Return to clinic for any scheduled appointments or for any gynecologic concerns as needed.   

## 2019-10-06 NOTE — Progress Notes (Signed)
   GYNECOLOGY OFFICE VISIT NOTE  History:   Danielle Duarte is a 59 y.o. G2P2 here today for evaluation of left labial lesion.  Noted this about three days ago, has since opened and drained after self-treatment with warm compresses, witch hazel application. She denies any abnormal vaginal discharge, bleeding, pelvic pain or other concerns.    Past Medical History:  Diagnosis Date  . Abnormal Pap smear   . Arthritis    right knee  . Fibroids   . Paroxysmal supraventricular tachycardia ([place]) 01/05/2014  . PONV (postoperative nausea and vomiting)     Past Surgical History:  Procedure Laterality Date  . APPENDECTOMY    . COLONOSCOPY  10/10/2010  . COLPOSCOPY  03/01/10  . HERNIA REPAIR  November 2014  . LAPAROSCOPIC LYSIS OF ADHESIONS  02/28/2012   Procedure: LAPAROSCOPIC LYSIS OF ADHESIONS;  Surgeon: Osborne Oman, MD;  Location: [facility];  Service: Gynecology;  Laterality: N/A;  . LAPAROSCOPY  02/28/2012   Procedure: LAPAROSCOPY OPERATIVE;  Surgeon: Osborne Oman, MD;  Location: [facility];  Service: Gynecology;  Laterality: N/A;  . SALPINGOOPHORECTOMY  02/28/2012   Procedure: SALPINGO OOPHORECTOMY;  Surgeon: Osborne Oman, MD;  Location: [facility];  Service: Gynecology;  Laterality: Left;  . TOTAL HIP ARTHROPLASTY  04/19/15   Left  . TOTAL KNEE ARTHROPLASTY Left 08/26/2018   Dr. Claiborne Billings at [facility]    The following portions of the patient's history were reviewed and updated as appropriate: allergies, current medications, past family history, past medical history, past social history, past surgical history and problem list.   Health Maintenance:  Normal pap and negative HRHPV on 03/13/2019.  Normal mammogram on 02/14/2019.   Review of Systems:  Pertinent items noted in HPI and remainder of comprehensive ROS otherwise negative.  Physical Exam:  BP (!) 150/88   Pulse 72   Wt 200 lb 9.6 oz (91 kg)   LMP 12/04/2011   BMI 33.38 kg/m  CONSTITUTIONAL: Well-developed, well-nourished  female in no acute distress.  NEUROLOGIC: Alert and oriented to person, place, and time. Normal muscle tone coordination. No cranial nerve deficit noted. PSYCHIATRIC: Normal mood and affect. Normal behavior. Normal judgment and thought content. CARDIOVASCULAR: Normal heart rate noted RESPIRATORY: Effort and breath sounds normal, no problems with respiration noted ABDOMEN: No masses noted. No other overt distention noted.   PELVIC: Normal appearing external genitalia with healing papular lesion on the external left labium majus, no erythema, no induration, no drainage, no fluctuance. Normal urethral meatus.  Performed in the presence of a chaperone     Assessment and Plan:      1. Labial lesion Likely folliculitis lesion. Healing well. No further intervention needed Told to call/come in for worsening symptoms.  Routine preventative health maintenance measures emphasized. Please refer to After Visit Summary for other counseling recommendations.   Return for any gynecologic concerns.    Total face-to-face time with patient: 10 minutes.  Over 50% of encounter was spent on counseling and coordination of care.   Verita Schneiders, MD, [place] [facility] [employer], [place]

## 2020-01-09 ENCOUNTER — Ambulatory Visit: Payer: BC Managed Care – PPO | Admitting: Physician Assistant

## 2020-01-09 ENCOUNTER — Other Ambulatory Visit: Payer: Self-pay

## 2020-01-09 ENCOUNTER — Encounter: Payer: Self-pay | Admitting: Physician Assistant

## 2020-01-09 VITALS — BP 142/92 | HR 78 | Temp 97.9°F | Wt 193.8 lb

## 2020-01-09 DIAGNOSIS — L989 Disorder of the skin and subcutaneous tissue, unspecified: Secondary | ICD-10-CM | POA: Diagnosis not present

## 2020-01-09 DIAGNOSIS — L84 Corns and callosities: Secondary | ICD-10-CM

## 2020-01-09 MED ORDER — CLOTRIMAZOLE 1 % EX CREA: 1.0000 "application " | TOPICAL_CREAM | Freq: Two times a day (BID) | CUTANEOUS | 0 refills | Status: DC

## 2020-01-09 MED ORDER — CORN CUSHIONS PADS: MEDICATED_PAD | 0 refills | Status: DC

## 2020-01-09 NOTE — Progress Notes (Signed)
Established patient visit   Patient: Danielle Duarte   DOB: March 08, 1961   59 y.o. Female  MRN: [id number] Visit Date: 01/09/2020  Today's healthcare provider: Trinna Post, PA-C   Chief Complaint  Patient presents with  . Rash  I,Markeith Jue M Makynleigh Breslin,acting as a scribe for [employer], PA-C.,have documented all relevant documentation on the behalf of Trinna Post, PA-C,as directed by  Trinna Post, PA-C while in the presence of Trinna Post, PA-C.  Subjective    Rash This is a new problem. The current episode started 1 to 4 weeks ago (12/12/2019, hairdresser noticed it). The problem has been gradually improving since onset. The affected locations include the neck. The rash is characterized by redness, dryness, scaling and itchiness. She was exposed to nothing. Past treatments include anti-itch cream and moisturizer (Aloe). The treatment provided mild relief.  Patient reports she was on antibiotic for 8 days the end of September,2021. Patient reports she may have a insect bite on her right pinky toe. She reports area is painful. She does not wear narrow toed shoes. She does wear compression stockings.      Medications: Outpatient Medications Prior to Visit  Medication Sig  . ADVAIR HFA 115-21 MCG/ACT inhaler   . azelastine (ASTELIN) 0.1 % nasal spray Place into both nostrils 2 (two) times daily. Use in each nostril as directed  . fexofenadine (ALLEGRA) 180 MG tablet Take 180 mg by mouth daily.  Marland Kitchen levothyroxine (SYNTHROID) 75 MCG tablet Take 1 tablet (75 mcg total) by mouth daily.  . Multiple Vitamin (MULTIVITAMIN WITH MINERALS) TABS Take 1 tablet by mouth daily.  Marland Kitchen PROAIR HFA 108 (90 Base) MCG/ACT inhaler TAKE 2 PUFFS BY MOUTH EVERY 4 TO 6 HOURS AS NEEDED . MUST LAST 90 DAYS PER MD   No facility-administered medications prior to visit.    Review of Systems  Skin: Positive for rash.      Objective    BP (!) 142/92 (BP Location: Left Arm, Patient Position:  Sitting, Cuff Size: Large)   Pulse 78   Temp 97.9 F (36.6 C) (Oral)   Wt 193 lb 12.8 oz (87.9 kg)   LMP 12/04/2011   SpO2 95%   BMI 32.25 kg/m    Physical Exam Constitutional:      Appearance: Normal appearance.  Feet:     Comments: A corn is present between her right 4th and 5th toes.  Skin:    Findings: Lesion present.  Neurological:     Mental Status: She is alert and oriented to person, place, and time. Mental status is at baseline.  Psychiatric:        Mood and Affect: Mood normal.        Behavior: Behavior normal.     Media Information   Document Information  Photos  Neck   01/09/2020 16:23  Attached To:  Office Visit on 01/09/20 with Trinna Post, PA-C  Source Information  Trinna Post, PA-C  [facility]     No results found for any visits on 01/09/20.  Assessment & Plan    1. Skin lesion  DDx: ringworm vs eczema vs psoriasis. Trial of antifungal as below, may consider topical steroid if not improving.   - clotrimazole (LOTRIMIN AF) 1 % cream; Apply 1 application topically 2 (two) times daily.  Dispense: 30 g; Refill: 0  2. Corn  - Foot Care Products (CORN CUSHIONS) PADS; Use one pad daily.  Dispense: 18 each;  Refill: 0   Return if symptoms worsen or fail to improve.      ITrinna Post, PA-C, have reviewed all documentation for this visit. The documentation on 01/12/20 for the exam, diagnosis, procedures, and orders are all accurate and complete.  The entirety of the information documented in the History of Present Illness, Review of Systems and Physical Exam were personally obtained by me. Portions of this information were initially documented by [facility] and reviewed by me for thoroughness and accuracy.     Paulene Floor  [facility] [phone] (phone) [phone] (fax)  [place]

## 2020-01-09 NOTE — Patient Instructions (Signed)
Lotrimin (I sent this in) Lamisil  Corn pads can be OTC

## 2020-01-21 ENCOUNTER — Encounter: Payer: Self-pay | Admitting: Radiology

## 2020-01-26 ENCOUNTER — Telehealth: Payer: Self-pay | Admitting: Family Medicine

## 2020-01-26 NOTE — Telephone Encounter (Signed)
Please advise if you are able to work patient in this week. There are no openings on your schedule. If patient is still symptomatic, the appointment needs to be virtual, right?

## 2020-01-26 NOTE — Telephone Encounter (Signed)
Patient is calling to schedule a hospital follow up appt. Patient was discharged from Urgent Care on 02/16/20 for Asthma and a chest x-ray. First available appt is 02/11/20. Patient was told to follow up in 1 week. Please advise Cb- [phone]

## 2020-01-26 NOTE — Telephone Encounter (Signed)
Pt called back.  Pt asked why this needs to be virtual.  When I tried to explain, he said can you just let me speak with the person that called me? Pt aware you will call back.

## 2020-01-26 NOTE — Telephone Encounter (Signed)
Patient called back to the office and I advised her as below. I offered her a virtual appointment. Patient accepted the appointment at first, but then changed her mind. She says she prefers an in office visit for someone to check her lungs. Patient is going to go to [facility] [address] so they can do everything (exam and possible repeat chest x ray) in one visit. Patient was concerned about multiple co pays so she decided this would be the best option.

## 2020-01-26 NOTE — Telephone Encounter (Signed)
I don't have anywhere to work her in this week. She can see a PA. Or I could see her next Monday at 3:40, but does need to be virtual

## 2020-01-26 NOTE — Telephone Encounter (Signed)
I tried calling patient to clarify message below. It looks like patient was seen at an urgent care on 01/18/2020 for pneumonia. Per message below, patient was told to f/u with PCP in 1 week. There weren't any openings at the time patient called our office earlier today. I tried calling patient to offer a virtual visit at 2:40pm. Left message to call back. OK for [facility] to advise patient and schedule appointment.

## 2020-01-27 ENCOUNTER — Telehealth: Payer: Self-pay | Admitting: Adult Health

## 2020-01-27 ENCOUNTER — Ambulatory Visit
Admission: RE | Admit: 2020-01-27 | Discharge: 2020-01-27 | Disposition: A | Discharge status: Home / Self Care | Payer: BC Managed Care – PPO | Source: Ambulatory Visit | Attending: Emergency Medicine | Admitting: Emergency Medicine

## 2020-01-27 VITALS — BP 147/94 | HR 74 | Temp 98.2°F | Resp 18 | Ht 65.0 in | Wt 193.8 lb

## 2020-01-27 DIAGNOSIS — R030 Elevated blood-pressure reading, without diagnosis of hypertension: Secondary | ICD-10-CM

## 2020-01-27 DIAGNOSIS — R059 Cough, unspecified: Secondary | ICD-10-CM | POA: Diagnosis not present

## 2020-01-27 DIAGNOSIS — R21 Rash and other nonspecific skin eruption: Secondary | ICD-10-CM

## 2020-01-27 DIAGNOSIS — Z1152 Encounter for screening for COVID-19: Secondary | ICD-10-CM

## 2020-01-27 NOTE — ED Triage Notes (Signed)
Pt reports having a cough x3 weeks. Pt was seen at [facility] and had xray and showed pna. Pt was prescribed abx at that time. abx course has been completed. Pt continue to have cough. Pt would like to be re evaluated.   Pt also sts she has a rash on neck that she would like to have re evaluated.

## 2020-01-27 NOTE — ED Provider Notes (Signed)
[facility] Palau    CSN: [id number] Arrival date & time: 01/27/20  1456      History   Chief Complaint Chief Complaint  Patient presents with  . Cough    HPI Danielle Duarte is a 59 y.o. female.   Patient presents with 3-week history of a cough.  She denies fever, chills, shortness of breath, or other symptoms.  [facility]; diagnosed with right lower lobe pneumonia; x-ray was negative for acute disease; Treated with Zithromax and Augmentin.  Patient also has a lesion on her posterior neck that she would like checked.  She was seen by her PCP for this on 01/09/2020; treated with clotrimazole cream.  She states the lesion has been improving.  Her medical history includes asthma, paroxysmal SVT, arthritis.  The history is provided by the patient and medical records.    Past Medical History:  Diagnosis Date  . Abnormal Pap smear   . Arthritis    right knee  . Fibroids   . Paroxysmal supraventricular tachycardia ([place]) 01/05/2014  . PONV (postoperative nausea and vomiting)     Patient Active Problem List   Diagnosis Date Noted  . History of PSVT (paroxysmal supraventricular tachycardia) 08/26/2018  . Immune to varicella 04/09/2018  . Cervical high risk human papillomavirus (HPV) DNA test positive 11/05/2017  . Vaginal dryness, menopausal 10/25/2017  . Swelling of limb 04/18/2017  . Varicose veins of leg with swelling, 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/18/2017  . 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 Allergic rhinitis 03/30/2015  . Allergic asthma 03/30/2015  . Direct hernia 03/30/2015  . Edema extremities 03/30/2015  . 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/30/2015  . History of palpitations 03/30/2015  . HLD (hyperlipidemia) 03/30/2015  . BMI 33.0-33.9,adult 03/30/2015  . Beat, premature ventricular 03/30/2015  . Nail deformity 03/30/2015  . Degenerative arthritis of hip 01/14/2015  . Arthritis of knee, degenerative 01/14/2015    . Hypothyroid 12/07/2014  . Normal cytology on  Pap smear with positive HPV test 06/12/2012  . 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 APPENDECTOMY    . COLONOSCOPY  10/10/2010  . COLPOSCOPY  03/01/10  . HERNIA REPAIR  November 2014  . LAPAROSCOPIC LYSIS OF ADHESIONS  02/28/2012   Procedure: LAPAROSCOPIC LYSIS OF ADHESIONS;  Surgeon: Osborne Oman, MD;  Location: [facility];  Service: Gynecology;  Laterality: N/A;  . LAPAROSCOPY  02/28/2012   Procedure: LAPAROSCOPY OPERATIVE;  Surgeon: Osborne Oman, MD;  Location: [facility];  Service: Gynecology;  Laterality: N/A;  . SALPINGOOPHORECTOMY  02/28/2012   Procedure: SALPINGO OOPHORECTOMY;  Surgeon: Osborne Oman, MD;  Location: [facility];  Service: Gynecology;  Laterality: Left;  . TOTAL HIP ARTHROPLASTY  04/19/15   Left  . TOTAL KNEE ARTHROPLASTY Left 08/26/2018   Dr. Claiborne Billings at [facility]    OB History    Gravida  2   Para  2   Term      Preterm      AB      Living  2     SAB      TAB      Ectopic      Multiple      Live Births  2            Home Medications    Prior to Admission medications   Medication Sig Start Date End  Date Taking? Authorizing Provider  ADVAIR [facility] 712-45 MCG/ACT inhaler  03/22/16   [provider]  azelastine (ASTELIN) 0.1 % nasal spray Place into both nostrils 2 (two) times daily. Use in each nostril as directed    [provider]  clotrimazole (LOTRIMIN AF) 1 % cream Apply 1 application topically 2 (two) times daily. 01/09/20   Trinna Post, PA-C  fexofenadine (ALLEGRA) 180 MG tablet Take 180 mg by mouth daily.    [provider]  Foot Care Products (CORN CUSHIONS) PADS Use one pad daily. 01/09/20   Trinna Post, PA-C  levothyroxine (SYNTHROID) 75 MCG tablet Take 1 tablet (75 mcg total) by mouth daily. 05/13/19   Birdie Sons, MD  Multiple Vitamin (MULTIVITAMIN WITH  MINERALS) TABS Take 1 tablet by mouth daily.    [provider]  PROAIR HFA 108 (90 Base) MCG/ACT inhaler TAKE 2 PUFFS BY MOUTH EVERY 4 TO 6 HOURS AS NEEDED . MUST LAST 90 DAYS PER MD 08/10/15   [provider]    Family History Family History  Problem Relation Age of Onset  . Hypertension Mother   . Hypertension Father   . Diabetes Paternal Uncle     Social History Social History   Tobacco Use  . Smoking status: Former Smoker    Quit date: 03/13/1987    Years since quitting: 32.8  . Smokeless tobacco: Never Used  Vaping Use  . Vaping Use: Never used  Substance Use Topics  . Alcohol use: No  . Drug use: No     Allergies   Dust mite extract, Other, and Pollen extract   Review of Systems Review of Systems  Constitutional: Negative for chills and fever.  HENT: Negative for ear pain and sore throat.   Eyes: Negative for pain and visual disturbance.  Respiratory: Positive for cough. Negative for shortness of breath.   Cardiovascular: Negative for chest pain and palpitations.  Gastrointestinal: Negative for abdominal pain and vomiting.  Genitourinary: Negative for dysuria and hematuria.  Musculoskeletal: Negative for arthralgias and back pain.  Skin: Positive for rash. Negative for color change.  Neurological: Negative for seizures and syncope.  All other systems reviewed and are negative.    Physical Exam Triage Vital Signs ED Triage Vitals  Enc Vitals Group     BP      Pulse      Resp      Temp      Temp src      SpO2      Weight      Height      Head Circumference      Peak Flow      Pain Score      Pain Loc      Pain Edu?      Excl. in [place]?    No data found.  Updated Vital Signs BP (!) 147/94   Pulse 74   Temp 98.2 F (36.8 C) (Oral)   Resp 18   Ht 5\' 5"  (1.651 m)   Wt 193 lb 12.6 oz (87.9 kg)   LMP 12/04/2011   SpO2 95%   BMI 32.25 kg/m   Visual Acuity Right Eye Distance:   Left Eye Distance:   Bilateral Distance:     Right Eye Near:   Left Eye Near:    Bilateral Near:     Physical Exam Vitals and nursing note reviewed.  Constitutional:      General: She is not in acute distress.  Appearance: She is well-developed. She is not ill-appearing.  HENT:     Head: Normocephalic and atraumatic.     Mouth/Throat:     Mouth: Mucous membranes are moist.  Eyes:     Conjunctiva/sclera: Conjunctivae normal.  Cardiovascular:     Rate and Rhythm: Normal rate and regular rhythm.     Heart sounds: Normal heart sounds.  Pulmonary:     Effort: Pulmonary effort is normal. No respiratory distress.     Breath sounds: Normal breath sounds. No wheezing, rhonchi or rales.  Abdominal:     Palpations: Abdomen is soft.     Tenderness: There is no abdominal tenderness.  Musculoskeletal:     Cervical back: Neck supple.  Skin:    General: Skin is warm and dry.     Findings: Rash present.     Comments: Light pink, circular, flat lesion on posterior neck; approximately 3 cm diameter.  Neurological:     General: No focal deficit present.     Mental Status: She is alert and oriented to person, place, and time.     Gait: Gait normal.  Psychiatric:        Mood and Affect: Mood normal.        Behavior: Behavior normal.      UC Treatments / Results  Labs (all labs ordered are listed, but only abnormal results are displayed) Labs Reviewed  NOVEL CORONAVIRUS, NAA    EKG   Radiology No results found.  Procedures Procedures (including critical care time)  Medications Ordered in UC Medications - No data to display  Initial Impression / Assessment and Plan / UC Course  I have reviewed the triage vital signs and the nursing notes.  Pertinent labs & imaging results that were available during my care of the patient were reviewed by me and considered in my medical decision making (see chart for details).   Cough, patient request for COVID test, rash, elevated blood pressure reading.  Patient is well-appearing  and her exam is reassuring.  Her lungs are clear and her sat is 95%.  PCR COVID pending.  The rash on her neck appears to be improved when compared to the picture in her chart; instructed her to continue using the clotrimazole cream and follow-up with her PCP.  Discussed that her blood pressure is elevated today and needs to be rechecked by her PCP in 2 to 4 weeks.  Patient agrees to plan of care.   Final Clinical Impressions(s) / UC Diagnoses   Final diagnoses:  Cough  Elevated blood pressure reading  Encounter for screening for COVID-19  Rash and nonspecific skin eruption     Discharge Instructions     Your COVID test is pending.  We will call you if it is positive.    Your blood pressure is elevated today at 147/94.  Please have this rechecked by your primary care provider in 2-4 weeks.         ED Prescriptions    None     PDMP not reviewed this encounter.   Sharion Balloon, NP 01/27/20 1544

## 2020-01-27 NOTE — Discharge Instructions (Addendum)
Your COVID test is pending.  We will call you if it is positive.    Your blood pressure is elevated today at 147/94.  Please have this rechecked by your primary care provider in 2-4 weeks.

## 2020-01-29 LAB — SARS-COV-2, NAA 2 DAY TAT

## 2020-01-29 LAB — NOVEL CORONAVIRUS, NAA: SARS-CoV-2, NAA: NOT DETECTED

## 2020-03-16 ENCOUNTER — Ambulatory Visit: Payer: BC Managed Care – PPO

## 2020-03-29 ENCOUNTER — Encounter: Payer: BC Managed Care – PPO | Admitting: Dermatology

## 2020-04-13 ENCOUNTER — Ambulatory Visit: Payer: BC Managed Care – PPO

## 2020-04-20 ENCOUNTER — Other Ambulatory Visit (HOSPITAL_COMMUNITY)
Admission: RE | Admit: 2020-04-20 | Discharge: 2020-04-20 | Disposition: A | Discharge status: Home / Self Care | Payer: BC Managed Care – PPO | Source: Ambulatory Visit | Attending: Obstetrics & Gynecology | Admitting: Obstetrics & Gynecology

## 2020-04-20 ENCOUNTER — Encounter: Payer: Self-pay | Admitting: Obstetrics & Gynecology

## 2020-04-20 ENCOUNTER — Other Ambulatory Visit: Payer: Self-pay

## 2020-04-20 ENCOUNTER — Ambulatory Visit (INDEPENDENT_AMBULATORY_CARE_PROVIDER_SITE_OTHER): Payer: BC Managed Care – PPO | Admitting: Obstetrics & Gynecology

## 2020-04-20 VITALS — BP 146/87 | HR 69 | Ht 64.0 in | Wt 199.0 lb

## 2020-04-20 DIAGNOSIS — Z01419 Encounter for gynecological examination (general) (routine) without abnormal findings: Secondary | ICD-10-CM

## 2020-04-20 NOTE — Patient Instructions (Signed)
Preventive Care 84-62 Years Old, Female Preventive care refers to lifestyle choices and visits with your health care provider that can promote health and wellness. This includes:  A yearly physical exam. This is also called an annual wellness visit.  Regular dental and eye exams.  Immunizations.  Screening for certain conditions.  Healthy lifestyle choices, such as: ? Eating a healthy diet. ? Getting regular exercise. ? Not using drugs or products that contain nicotine and tobacco. ? Limiting alcohol use. What can I expect for my preventive care visit? Physical exam Your health care provider will check your:  Height and weight. These may be used to calculate your BMI (body mass index). BMI is a measurement that tells if you are at a healthy weight.  Heart rate and blood pressure.  Body temperature.  Skin for abnormal spots. Counseling Your health care provider may ask you questions about your:  Past medical problems.  Family's medical history.  Alcohol, tobacco, and drug use.  Emotional well-being.  Home life and relationship well-being.  Sexual activity.  Diet, exercise, and sleep habits.  Work and work Statistician.  Access to firearms.  Method of birth control.  Menstrual cycle.  Pregnancy history. What immunizations do I need? Vaccines are usually given at various ages, according to a schedule. Your health care provider will recommend vaccines for you based on your age, medical history, and lifestyle or other factors, such as travel or where you work.   What tests do I need? Blood tests  Lipid and cholesterol levels. These may be checked every 5 years, or more often if you are over 3 years old.  Hepatitis C test.  Hepatitis B test. Screening  Lung cancer screening. You may have this screening every year starting at age 62 if you have a 30-pack-year history of smoking and currently smoke or have quit within the past 15 years.  Colorectal cancer  screening. ? All adults should have this screening starting at age 62 and continuing until age 17. ? Your health care provider may recommend screening at age 62 if you are at increased risk. ? You will have tests every 1-10 years, depending on your results and the type of screening test.  Diabetes screening. ? This is done by checking your blood sugar (glucose) after you have not eaten for a while (fasting). ? You may have this done every 1-3 years.  Mammogram. ? This may be done every 1-2 years. ? Talk with your health care provider about when you should start having regular mammograms. This may depend on whether you have a family history of breast cancer.  BRCA-related cancer screening. This may be done if you have a family history of breast, ovarian, tubal, or peritoneal cancers.  Pelvic exam and Pap test. ? This may be done every 3 years starting at age 62. ? Starting at age 62, this may be done every 5 years if you have a Pap test in combination with an HPV test. Other tests  STD (sexually transmitted disease) testing, if you are at risk.  Bone density scan. This is done to screen for osteoporosis. You may have this scan if you are at high risk for osteoporosis. Talk with your health care provider about your test results, treatment options, and if necessary, the need for more tests. Follow these instructions at home: Eating and drinking  Eat a diet that includes fresh fruits and vegetables, whole grains, lean protein, and low-fat dairy products.  Take vitamin and mineral supplements  as recommended by your health care provider.  Do not drink alcohol if: ? Your health care provider tells you not to drink. ? You are pregnant, may be pregnant, or are planning to become pregnant.  If you drink alcohol: ? Limit how much you have to 0-1 drink a day. ? Be aware of how much alcohol is in your drink. In the [place] one drink equals one 12 oz bottle of beer (355 mL), one 5 oz glass of  wine (148 mL), or one 1 oz glass of hard liquor (44 mL).   Lifestyle  Take daily care of your teeth and gums. Brush your teeth every morning and night with fluoride toothpaste. Floss one time each day.  Stay active. Exercise for at least 30 minutes 5 or more days each week.  Do not use any products that contain nicotine or tobacco, such as cigarettes, e-cigarettes, and chewing tobacco. If you need help quitting, ask your health care provider.  Do not use drugs.  If you are sexually active, practice safe sex. Use a condom or other form of protection to prevent STIs (sexually transmitted infections).  If you do not wish to become pregnant, use a form of birth control. If you plan to become pregnant, see your health care provider for a prepregnancy visit.  If told by your health care provider, take low-dose aspirin daily starting at age 62.  Find healthy ways to cope with stress, such as: ? Meditation, yoga, or listening to music. ? Journaling. ? Talking to a trusted person. ? Spending time with friends and family. Safety  Always wear your seat belt while driving or riding in a vehicle.  Do not drive: ? If you have been drinking alcohol. Do not ride with someone who has been drinking. ? When you are tired or distracted. ? While texting.  Wear a helmet and other protective equipment during sports activities.  If you have firearms in your house, make sure you follow all gun safety procedures. What's next?  Visit your health care provider once a year for an annual wellness visit.  Ask your health care provider how often you should have your eyes and teeth checked.  Stay up to date on all vaccines. This information is not intended to replace advice given to you by your health care provider. Make sure you discuss any questions you have with your health care provider. Document Revised: 12/02/2019 Document Reviewed: 11/08/2017 Elsevier Patient Education  2021 [employer].  

## 2020-04-20 NOTE — Progress Notes (Signed)
GYNECOLOGY ANNUAL PREVENTATIVE CARE ENCOUNTER NOTE  History:     Danielle Duarte is a 60 y.o. G2P2 female here for a routine annual gynecologic exam.  Current complaints: none.   Denies abnormal vaginal bleeding, discharge, pelvic pain or other gynecologic concerns.    Gynecologic History Patient's last menstrual period was 12/04/2011. Contraception: post menopausal status Last Pap: 03/13/2019. Results were: normal with negative HPV Last mammogram: 02/14/2019. Results were: normal  Obstetric History OB History  Gravida Para Term Preterm AB Living  2 2       2   SAB IAB Ectopic Multiple Live Births          2    # Outcome Date GA Lbr Len/2nd Weight Sex Delivery Anes PTL Lv  2 Para 1995        LIV  1 Para 1993        LIV    Past Medical History:  Diagnosis Date  . Abnormal Pap smear   . Arthritis    right knee  . Dysplastic nevus 03/24/2019   spinal mid upper back, mod atypia   . Fibroids   . Normal cytology on  Pap smear with positive HPV test 06/12/2012   Repeat cotesting in one year   . Paroxysmal supraventricular tachycardia ([place]) 01/05/2014  . PONV (postoperative nausea and vomiting)     Past Surgical History:  Procedure Laterality Date  . APPENDECTOMY    . COLONOSCOPY  10/10/2010  . COLPOSCOPY  03/01/10  . HERNIA REPAIR  November 2014  . LAPAROSCOPIC LYSIS OF ADHESIONS  02/28/2012   Procedure: LAPAROSCOPIC LYSIS OF ADHESIONS;  Surgeon: Osborne Oman, MD;  Location: [facility];  Service: Gynecology;  Laterality: N/A;  . LAPAROSCOPY  02/28/2012   Procedure: LAPAROSCOPY OPERATIVE;  Surgeon: Osborne Oman, MD;  Location: [facility];  Service: Gynecology;  Laterality: N/A;  . SALPINGOOPHORECTOMY  02/28/2012   Procedure: SALPINGO OOPHORECTOMY;  Surgeon: Osborne Oman, MD;  Location: [facility];  Service: Gynecology;  Laterality: Left;  . TOTAL HIP ARTHROPLASTY  04/19/15   Left  . TOTAL KNEE ARTHROPLASTY Left 08/26/2018   Dr. Claiborne Billings at [facility]    Current Outpatient  Medications on File Prior to Visit  Medication Sig Dispense Refill  . ADVAIR HFA 115-21 MCG/ACT inhaler     . azelastine (ASTELIN) 0.1 % nasal spray Place into both nostrils 2 (two) times daily. Use in each nostril as directed    . clotrimazole (LOTRIMIN AF) 1 % cream Apply 1 application topically 2 (two) times daily. 30 g 0  . fexofenadine (ALLEGRA) 180 MG tablet Take 180 mg by mouth daily.    . Foot Care Products (CORN CUSHIONS) PADS Use one pad daily. 18 each 0  . levothyroxine (SYNTHROID) 75 MCG tablet Take 1 tablet (75 mcg total) by mouth daily. 90 tablet 3  . Multiple Vitamin (MULTIVITAMIN WITH MINERALS) TABS Take 1 tablet by mouth daily.    Marland Kitchen PROAIR HFA 108 (90 Base) MCG/ACT inhaler TAKE 2 PUFFS BY MOUTH EVERY 4 TO 6 HOURS AS NEEDED . MUST LAST 90 DAYS PER MD  0   No current facility-administered medications on file prior to visit.    Allergies  Allergen Reactions  . Dust Mite Extract   . Other Other (See Comments)    Dust mites  . Pollen Extract     from trees and weeds    Social History:  reports that she quit smoking about 33 years ago. She has never  used smokeless tobacco. She reports that she does not drink alcohol and does not use drugs.  Family History  Problem Relation Age of Onset  . Hypertension Mother   . Hypertension Father   . Diabetes Paternal Uncle     The following portions of the patient's history were reviewed and updated as appropriate: allergies, current medications, past family history, past medical history, past social history, past surgical history and problem list.  Review of Systems Pertinent items noted in HPI and remainder of comprehensive ROS otherwise negative.  Physical Exam:  BP (!) 146/87   Pulse 69   Ht 5\' 4"  (1.626 m)   Wt 199 lb (90.3 kg)   LMP 12/04/2011   BMI 34.16 kg/m  CONSTITUTIONAL: Well-developed, well-nourished female in no acute distress.  HENT:  Normocephalic, atraumatic, External right and left ear normal.  EYES:  Conjunctivae and EOM are normal. Pupils are equal, round, and reactive to light. No scleral icterus.  NECK: Normal range of motion, supple, no masses.  Normal thyroid.  SKIN: Skin is warm and dry. No rash noted. Not diaphoretic. No erythema. No pallor. MUSCULOSKELETAL: Normal range of motion. No tenderness.  No cyanosis, clubbing, or edema.  NEUROLOGIC: Alert and oriented to person, place, and time. Normal reflexes, muscle tone coordination.  PSYCHIATRIC: Normal mood and affect. Normal behavior. Normal judgment and thought content. CARDIOVASCULAR: Normal heart rate noted, regular rhythm RESPIRATORY: Clear to auscultation bilaterally. Effort and breath sounds normal, no problems with respiration noted. BREASTS: Symmetric in size. No masses, tenderness, skin changes, nipple drainage, or lymphadenopathy bilaterally. Performed in the presence of a chaperone. ABDOMEN: Soft, no distention noted.  No tenderness, rebound or guarding.  PELVIC: Normal appearing external genitalia and urethral meatus with moderate atrophy; atrophic vaginal mucosa and cervix.  No abnormal discharge noted.  Pap smear obtained.  Normal uterine size, no other palpable masses, no uterine or adnexal tenderness.  Performed in the presence of a chaperone.   Assessment and Plan:      1. Well woman exam with routine gynecological exam - Cytology - PAP Will follow up results of pap smear and manage accordingly. Mammogram scheduled in March 2022 Routine preventative health maintenance measures emphasized. Please refer to After Visit Summary for other counseling recommendations.      Verita Schneiders, MD, [place] for [employer], [place]

## 2020-04-20 NOTE — Progress Notes (Signed)
Pt requesting pap smear - last 03/13/2019 Pt is having mamo 05/31/2020 due to Booster recently - [place] made her push appt out

## 2020-04-22 ENCOUNTER — Other Ambulatory Visit: Payer: Self-pay | Admitting: Family Medicine

## 2020-04-22 DIAGNOSIS — Z01419 Encounter for gynecological examination (general) (routine) without abnormal findings: Secondary | ICD-10-CM

## 2020-04-26 ENCOUNTER — Other Ambulatory Visit: Payer: Self-pay

## 2020-04-26 ENCOUNTER — Ambulatory Visit: Payer: BC Managed Care – PPO | Admitting: Dermatology

## 2020-04-26 DIAGNOSIS — Z1283 Encounter for screening for malignant neoplasm of skin: Secondary | ICD-10-CM

## 2020-04-26 DIAGNOSIS — L821 Other seborrheic keratosis: Secondary | ICD-10-CM

## 2020-04-26 DIAGNOSIS — D1800 Hemangioma unspecified site: Secondary | ICD-10-CM

## 2020-04-26 DIAGNOSIS — Z86018 Personal history of other benign neoplasm: Secondary | ICD-10-CM

## 2020-04-26 DIAGNOSIS — D692 Other nonthrombocytopenic purpura: Secondary | ICD-10-CM

## 2020-04-26 DIAGNOSIS — L219 Seborrheic dermatitis, unspecified: Secondary | ICD-10-CM

## 2020-04-26 DIAGNOSIS — L578 Other skin changes due to chronic exposure to nonionizing radiation: Secondary | ICD-10-CM

## 2020-04-26 DIAGNOSIS — L603 Nail dystrophy: Secondary | ICD-10-CM

## 2020-04-26 DIAGNOSIS — Z872 Personal history of diseases of the skin and subcutaneous tissue: Secondary | ICD-10-CM | POA: Diagnosis not present

## 2020-04-26 DIAGNOSIS — L814 Other melanin hyperpigmentation: Secondary | ICD-10-CM

## 2020-04-26 DIAGNOSIS — D229 Melanocytic nevi, unspecified: Secondary | ICD-10-CM

## 2020-04-26 MED ORDER — TEXACORT 2.5 % EX SOLN: 1.0000 "application " | Freq: Every day | CUTANEOUS | 2 refills | Status: DC

## 2020-04-26 NOTE — Progress Notes (Deleted)
   Follow-Up Visit   Subjective  Danielle Duarte is a 60 y.o. female who presents for the following: Annual Exam (History of dysplastic nevus of spinal mid upper back - TBSE today).  The patient presents for Total-Body Skin Exam (TBSE) for skin cancer screening and mole check.   The following portions of the chart were reviewed this encounter and updated as appropriate:       Review of Systems:  No other skin or systemic complaints except as noted in HPI or Assessment and Plan.  Objective  Well appearing patient in no apparent distress; mood and affect are within normal limits.  A full examination was performed including scalp, head, eyes, ears, nose, lips, neck, chest, axillae, abdomen, back, buttocks, bilateral upper extremities, bilateral lower extremities, hands, feet, fingers, toes, fingernails, and toenails. All findings within normal limits unless otherwise noted below.  Objective  Bilateral temples: Clear today  Objective  Spinal mid upper back: Well healed biopsy site  Objective  bilateral ears: Scaliness   Assessment & Plan    Lentigines - Scattered tan macules - Discussed due to sun exposure - Benign, observe - Call for any changes  Seborrheic Keratoses - Stuck-on, waxy, tan-brown papules and plaques  - Discussed benign etiology and prognosis. - Observe - Call for any changes  Melanocytic Nevi - Tan-brown and/or pink-flesh-colored symmetric macules and papules - Benign appearing on exam today - Observation - Call clinic for new or changing moles - Recommend daily use of broad spectrum spf 30+ sunscreen to sun-exposed areas.   Hemangiomas - Red papules - Discussed benign nature - Observe - Call for any changes  Actinic Damage - Chronic, secondary to cumulative UV/sun exposure - diffuse scaly erythematous macules with underlying dyspigmentation - Recommend daily broad spectrum sunscreen SPF 30+ to sun-exposed areas, reapply every 2 hours as  needed.  - Call for new or changing lesions.  Skin cancer screening performed today.   Return in about 1 year (around 04/26/2021) for TBSE.  I, Ashok Cordia, CMA, am acting as scribe for Sarina Ser, MD .

## 2020-04-26 NOTE — Progress Notes (Unsigned)
Follow-Up Visit   Subjective  Danielle Duarte is a 60 y.o. female who presents for the following: Annual Exam (History of dysplastic nevus of spinal mid upper back - TBSE today). The patient presents for Total-Body Skin Exam (TBSE) for skin cancer screening and mole check.  The following portions of the chart were reviewed this encounter and updated as appropriate:   Tobacco  Allergies  Meds  Problems  Med Hx  Surg Hx  Fam Hx     Review of Systems:  No other skin or systemic complaints except as noted in HPI or Assessment and Plan.  Objective  Well appearing patient in no apparent distress; mood and affect are within normal limits.  A full examination was performed including scalp, head, eyes, ears, nose, lips, neck, chest, axillae, abdomen, back, buttocks, bilateral upper extremities, bilateral lower extremities, hands, feet, fingers, toes, fingernails, and toenails. All findings within normal limits unless otherwise noted below.  Objective  Bilateral temples: Clear today  Objective  Spinal mid upper back: Well healed biopsy site  Objective  bilateral ears: Scaliness  Objective  Right great toenail: Nail dystrophy   Assessment & Plan    Lentigines - Scattered tan macules - Discussed due to sun exposure - Benign, observe - Call for any changes  Seborrheic Keratoses - Stuck-on, waxy, tan-brown papules and plaques  - Discussed benign etiology and prognosis. - Observe - Call for any changes  Melanocytic Nevi - Tan-brown and/or pink-flesh-colored symmetric macules and papules - Benign appearing on exam today - Observation - Call clinic for new or changing moles - Recommend daily use of broad spectrum spf 30+ sunscreen to sun-exposed areas.   Hemangiomas - Red papules - Discussed benign nature - Observe - Call for any changes  Actinic Damage - Chronic, secondary to cumulative UV/sun exposure - diffuse scaly erythematous macules with underlying  dyspigmentation - Recommend daily broad spectrum sunscreen SPF 30+ to sun-exposed areas, reapply every 2 hours as needed.  - Call for new or changing lesions.  Skin cancer screening performed today.  Purpura - Chronic; persistent and recurrent.  Treatable, but not curable. - Violaceous macules and patches - Benign - Related to trauma, age, sun damage and/or use of blood thinners, chronic use of topical and/or oral steroids - Observe - Can use OTC arnica containing moisturizer such as Dermend Bruise Formula if desired - Call for worsening or other concerns ' History of actinic keratoses Bilateral temples Clear. Observe for recurrence. Call clinic for new or changing lesions.  Recommend regular skin exams, daily broad-spectrum spf 30+ sunscreen use, and photoprotection.    History of dysplastic nevus Spinal mid upper back Clear. Observe for recurrence. Call clinic for new or changing lesions.  Recommend regular skin exams, daily broad-spectrum spf 30+ sunscreen use, and photoprotection.     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 It tends to be exacerbated by stress and cooler weather.  People who have neurologic disease may experience new onset or exacerbation of existing seborrheic dermatitis.  The condition is not curable but treatable and can be controlled.  Discussed Dermotic oil as alternative tx.  HYDROCORTISONE, TOPICAL, (TEXACORT) 2.5 % SOLN - bilateral ears  Nail dystrophy Right great toenail No treatment  at this time. Do not suspect tinea. Discussed possible psoriasis.  Skin cancer screening  Return in about 1 year (around 04/26/2021) for TBSE.  I, Ashok Cordia, CMA, am acting as scribe for Sarina Ser, MD .  Documentation: I have reviewed the above documentation for accuracy and completeness, and I agree with the above.  Sarina Ser, MD

## 2020-04-27 ENCOUNTER — Encounter: Payer: Self-pay | Admitting: Dermatology

## 2020-04-28 ENCOUNTER — Encounter: Payer: Self-pay | Admitting: Dermatology

## 2020-04-29 ENCOUNTER — Encounter: Payer: Self-pay | Admitting: Obstetrics & Gynecology

## 2020-04-29 ENCOUNTER — Telehealth: Payer: Self-pay | Admitting: *Deleted

## 2020-04-29 DIAGNOSIS — R87610 Atypical squamous cells of undetermined significance on cytologic smear of cervix (ASC-US): Secondary | ICD-10-CM | POA: Insufficient documentation

## 2020-04-29 DIAGNOSIS — R87810 Cervical high risk human papillomavirus (HPV) DNA test positive: Secondary | ICD-10-CM | POA: Insufficient documentation

## 2020-04-29 LAB — CYTOLOGY - PAP
Comment: NEGATIVE
Comment: NEGATIVE
Diagnosis: UNDETERMINED — AB
HPV 16: NEGATIVE
HPV 18 / 45: NEGATIVE
High risk HPV: POSITIVE — AB

## 2020-04-29 NOTE — Telephone Encounter (Signed)
-----   Message from Osborne Oman, MD sent at 04/29/2020  1:34 PM EST ----- Please schedule patient for colposcopy for ASCUS +HRHPV pap done on 04/20/2020. Please call to inform patient of results and need for appointment.

## 2020-04-29 NOTE — Telephone Encounter (Signed)
Pt informed of pap results and the need for a colpo. Pt verbalizes and understands.

## 2020-05-11 ENCOUNTER — Ambulatory Visit (INDEPENDENT_AMBULATORY_CARE_PROVIDER_SITE_OTHER): Payer: BC Managed Care – PPO | Admitting: Family Medicine

## 2020-05-11 ENCOUNTER — Encounter: Payer: Self-pay | Admitting: Family Medicine

## 2020-05-11 ENCOUNTER — Other Ambulatory Visit: Payer: Self-pay

## 2020-05-11 VITALS — BP 130/84 | HR 71 | Resp 16 | Ht 64.5 in | Wt 202.0 lb

## 2020-05-11 DIAGNOSIS — Z6834 Body mass index (BMI) 34.0-34.9, adult: Secondary | ICD-10-CM | POA: Diagnosis not present

## 2020-05-11 DIAGNOSIS — Z8679 Personal history of other diseases of the circulatory system: Secondary | ICD-10-CM

## 2020-05-11 DIAGNOSIS — Z0000 Encounter for general adult medical examination without abnormal findings: Secondary | ICD-10-CM

## 2020-05-11 DIAGNOSIS — M2550 Pain in unspecified joint: Secondary | ICD-10-CM

## 2020-05-11 DIAGNOSIS — E039 Hypothyroidism, unspecified: Secondary | ICD-10-CM | POA: Diagnosis not present

## 2020-05-11 DIAGNOSIS — E785 Hyperlipidemia, unspecified: Secondary | ICD-10-CM | POA: Diagnosis not present

## 2020-05-11 DIAGNOSIS — Z1211 Encounter for screening for malignant neoplasm of colon: Secondary | ICD-10-CM

## 2020-05-11 DIAGNOSIS — Z8262 Family history of osteoporosis: Secondary | ICD-10-CM

## 2020-05-11 NOTE — Patient Instructions (Signed)
.   Please go to the lab draw station in [address] on the second floor of [facility]  when you are fasting for 8 hours. Normal hours are 8:00am to 11:30am and 1:00pm to 4:00pm Monday through Friday

## 2020-05-11 NOTE — Progress Notes (Signed)
Complete physical exam   Patient: Danielle Duarte   DOB: 07/03/1960   60 y.o. Female  MRN: [id number] Visit Date: 05/11/2020  Today's healthcare provider: Lelon Huh, MD   Chief Complaint  Patient presents with  . Annual Exam   Subjective    Danielle Duarte is a 60 y.o. female who presents today for a complete physical exam.  She reports consuming a general diet. The patient does not participate in regular exercise at present. She generally feels well. She reports sleeping fairly well. She does not have additional problems to discuss today.   Hypothyroid, follow-up  Lab Results  Component Value Date   TSH 1.630 05/09/2019   TSH 1.200 04/12/2018   TSH 2.100 04/09/2017   FREET4 1.09 12/08/2011   T4TOTAL 7.5 07/24/2016   T4TOTAL 5.7 04/04/2016   Wt Readings from Last 3 Encounters:  05/11/20 202 lb (91.6 kg)  04/20/20 199 lb (90.3 kg)  01/27/20 193 lb 12.6 oz (87.9 kg)    She was last seen for hypothyroid 1 years ago.  Management since that visit includes no medication changes. She reports good compliance with treatment. She is not having side effects.   Symptoms: No change in energy level No constipation  No diarrhea No heat / cold intolerance  No nervousness No palpitations  No weight changes     Lipid/Cholesterol, Follow-up  Last lipid panel Other pertinent labs  Lab Results  Component Value Date   CHOL 222 (H) 05/09/2019   HDL 67 05/09/2019   LDLCALC 136 (H) 05/09/2019   TRIG 110 05/09/2019   CHOLHDL 3.3 05/09/2019   Lab Results  Component Value Date   ALT 23 05/09/2019   AST 23 05/09/2019   PLT 296 04/09/2017   TSH 1.630 05/09/2019     She was last seen for this 1 years ago.  Management since that visit includes no medication changes.  She reports good compliance with treatment. She is not having side effects.   Symptoms: No chest pain No chest pressure/discomfort  No dyspnea No lower extremity edema  No numbness or tingling of  extremity No orthopnea  No palpitations No paroxysmal nocturnal dyspnea  No speech difficulty No syncope   Current diet: well balanced Current exercise: no regular exercise  The 10-year ASCVD risk score Mikey Bussing DC Brooke Bonito., et al., 2013) is: 3.6%   Past Medical History:  Diagnosis Date  . Abnormal Pap smear   . Arthritis    right knee  . Dysplastic nevus 03/24/2019   spinal mid upper back, mod atypia   . Fibroids   . Normal cytology on  Pap smear with positive HPV test 06/12/2012   Repeat cotesting in one year   . Paroxysmal supraventricular tachycardia ([place]) 01/05/2014  . PONV (postoperative nausea and vomiting)    Past Surgical History:  Procedure Laterality Date  . APPENDECTOMY    . COLONOSCOPY  10/10/2010  . COLPOSCOPY  03/01/10  . HERNIA REPAIR  November 2014  . LAPAROSCOPIC LYSIS OF ADHESIONS  02/28/2012   Procedure: LAPAROSCOPIC LYSIS OF ADHESIONS;  Surgeon: Osborne Oman, MD;  Location: [facility];  Service: Gynecology;  Laterality: N/A;  . LAPAROSCOPY  02/28/2012   Procedure: LAPAROSCOPY OPERATIVE;  Surgeon: Osborne Oman, MD;  Location: [facility];  Service: Gynecology;  Laterality: N/A;  . SALPINGOOPHORECTOMY  02/28/2012   Procedure: SALPINGO OOPHORECTOMY;  Surgeon: Osborne Oman, MD;  Location: [facility];  Service: Gynecology;  Laterality: Left;  . TOTAL HIP  ARTHROPLASTY  04/19/15   Left  . TOTAL KNEE ARTHROPLASTY Left 08/26/2018   Dr. Claiborne Billings at [place] History  . Marital status: Divorced    Spouse name: Not on file  . Number of children: Not on file  . Years of education: Not on file  . Highest education level: Not on file  Occupational History  . Not on file  Tobacco Use  . Smoking status: Former Smoker    Quit date: 03/13/1987    Years since quitting: 33.1  . Smokeless tobacco: Never Used  Vaping Use  . Vaping Use: Never used  Substance and Sexual Activity  . Alcohol use: No  . Drug use: No  . Sexual activity: Not Currently   Other Topics Concern  . Not on file  Social History Narrative  . Not on file   Social Determinants of Health   Financial Resource Strain: Not on file  Food Insecurity: Not on file  Transportation Needs: Not on file  Physical Activity: Not on file  Stress: Not on file  Social Connections: Not on file  Intimate Partner Violence: Not on file   Family Status  Relation Name Status  . Mother  Alive  . Father  Alive  . Annamarie Major  Alive   Family History  Problem Relation Age of Onset  . Hypertension Mother   . Hypertension Father   . Diabetes Paternal Uncle    Allergies  Allergen Reactions  . Dust Mite Extract   . Other Other (See Comments)    Dust mites  . Pollen Extract     from trees and weeds    Patient Care Team: Birdie Sons, MD as PCP - General (Family Medicine) Tiajuana Amass, MD as Referring Physician (Allergy and Immunology) Earnestine Leys, MD (Specialist) Donnamae Jude, MD as Consulting Physician (Obstetrics and Gynecology) Ralene Bathe, MD (Dermatology)   Medications: Outpatient Medications Prior to Visit  Medication Sig  . ADVAIR HFA 115-21 MCG/ACT inhaler   . azelastine (ASTELIN) 0.1 % nasal spray Place into both nostrils 2 (two) times daily. Use in each nostril as directed  . clotrimazole (LOTRIMIN AF) 1 % cream Apply 1 application topically 2 (two) times daily. (Patient not taking: Reported on 04/20/2020)  . fexofenadine (ALLEGRA) 180 MG tablet Take 180 mg by mouth daily.  . Foot Care Products (CORN CUSHIONS) PADS Use one pad daily.  Marland Kitchen HYDROCORTISONE, TOPICAL, (TEXACORT) 2.5 % SOLN Apply 1 application topically daily. 3 days per week  . levothyroxine (SYNTHROID) 75 MCG tablet Take 1 tablet (75 mcg total) by mouth daily.  . Multiple Vitamin (MULTIVITAMIN WITH MINERALS) TABS Take 1 tablet by mouth daily.  Marland Kitchen PROAIR HFA 108 (90 Base) MCG/ACT inhaler TAKE 2 PUFFS BY MOUTH EVERY 4 TO 6 HOURS AS NEEDED . MUST LAST 90 DAYS PER MD   No  facility-administered medications prior to visit.    Review of Systems  All other systems reviewed and are negative.     Objective    BP 130/84   Pulse 71   Resp 16   Ht 5' 4.5" (1.638 m)   Wt 202 lb (91.6 kg)   LMP 12/04/2011   BMI 34.14 kg/m    Physical Exam    General Appearance:    Obese female. Alert, cooperative, in no acute distress, appears stated age   Head:    Normocephalic, without obvious abnormality, atraumatic  Eyes:    PERRL, conjunctiva/corneas clear, EOM's intact,  fundi    benign, both eyes  Ears:    Normal TM's and external ear canals, both ears  Nose:   Nares normal, septum midline, mucosa normal, no drainage    or sinus tenderness  Throat:   Lips, mucosa, and tongue normal; teeth and gums normal  Neck:   Supple, symmetrical, trachea midline, no adenopathy;    thyroid:  no enlargement/tenderness/nodules; no carotid   bruit or JVD  Back:     Symmetric, no curvature, ROM normal, no CVA tenderness  Lungs:     Clear to auscultation bilaterally, respirations unlabored  Chest Wall:    No tenderness or deformity   Heart:    Normal heart rate. Normal rhythm. No murmurs, rubs, or gallops.   Breast Exam:    deferred to gyn  Abdomen:     Soft, non-tender, bowel sounds active all four quadrants,    no masses, no organomegaly  Pelvic:    deferred  Extremities:   All extremities are intact. No cyanosis or edema  Pulses:   2+ and symmetric all extremities  Skin:   Skin color, texture, turgor normal, no rashes or lesions  Lymph nodes:   Cervical, supraclavicular, and axillary nodes normal  Neurologic:   CNII-XII intact, normal strength, sensation and reflexes    throughout     Last depression screening scores PHQ 2/9 Scores 05/11/2020 01/09/2020 05/06/2019  PHQ - 2 Score 0 0 0  PHQ- 9 Score 0 0 0   Last fall risk screening Fall Risk  05/11/2020  Falls in the past year? 0  Number falls in past yr: 0  Injury with Fall? 0  Risk for fall due to : No Fall Risks   Follow up -   Last Audit-C alcohol use screening Alcohol Use Disorder Test (AUDIT) 05/11/2020  1. How often do you have a drink containing alcohol? 0  2. How many drinks containing alcohol do you have on a typical day when you are drinking? 0  3. How often do you have six or more drinks on one occasion? 0  AUDIT-C Score 0  Alcohol Brief Interventions/Follow-up AUDIT Score <7 follow-up not indicated   A score of 3 or more in women, and 4 or more in men indicates increased risk for alcohol abuse, EXCEPT if all of the points are from question 1   No results found for any visits on 05/11/20.  Assessment & Plan    Routine Health Maintenance and Physical Exam  Exercise Activities and Dietary recommendations Goals    . Exercise 150 minutes per week (moderate activity)       Immunization History  Administered Date(s) Administered  . Influenza,inj,Quad PF,6+ Mos 03/30/2015, 02/20/2017, 12/10/2018  . Influenza-Unspecified 12/12/2015, 12/24/2017, 12/25/2019  . PFIZER(Purple Top)SARS-COV-2 Vaccination 05/13/2019, 06/10/2019, 02/29/2020  . Pneumococcal Polysaccharide-23 04/09/2018  . Tdap 07/19/2015    Health Maintenance  Topic Date Due  . HIV Screening  Never done  . COVID-19 Vaccine (4 - Booster for Pfizer series) 08/29/2020  . COLONOSCOPY (Pts 45-34yr Insurance coverage will need to be confirmed)  10/09/2020  . MAMMOGRAM  02/13/2021  . PAP SMEAR-Modifier  04/21/2023  . TETANUS/TDAP  07/18/2025  . INFLUENZA VACCINE  Completed  . Hepatitis C Screening  Completed  . HPV VACCINES  Aged Out    Discussed health benefits of physical activity, and encouraged her to engage in regular exercise appropriate for her age and condition.  She declined shingles vaccine since she had chicken pox vaccine.   1.  Hypothyroidism, unspecified type  - TSH  2. Hyperlipidemia, unspecified hyperlipidemia type Diet controlled - CBC - Lipid panel - Comprehensive metabolic panel  3. BMI  34.0-34.9,adult She anticipates starting regular exercise routine imminently.   4. History of PSVT (paroxysmal supraventricular tachycardia) Currently asymptomatic.   5. Family history of osteoporosis  - DG Bone Density; Future  6. Arthralgia, unspecified joint  - Sed Rate (ESR) - C-reactive protein  7. Colon cancer screening  - Ambulatory referral to Gastroenterology    No follow-ups on file.     The entirety of the information documented in the History of Present Illness, Review of Systems and Physical Exam were personally obtained by me. Portions of this information were initially documented by the CMA and reviewed by me for thoroughness and accuracy.      Lelon Huh, MD  [facility] [phone] (phone) [phone] (fax)  [place]

## 2020-05-19 ENCOUNTER — Other Ambulatory Visit: Payer: Self-pay

## 2020-05-19 ENCOUNTER — Telehealth (INDEPENDENT_AMBULATORY_CARE_PROVIDER_SITE_OTHER): Payer: Self-pay | Admitting: Gastroenterology

## 2020-05-19 DIAGNOSIS — Z1211 Encounter for screening for malignant neoplasm of colon: Secondary | ICD-10-CM

## 2020-05-19 MED ORDER — PEG 3350-KCL-NA BICARB-NACL 420 G PO SOLR: 4000.0000 mL | Freq: Once | ORAL | 0 refills | Status: AC

## 2020-05-19 NOTE — Progress Notes (Signed)
Gastroenterology Pre-Procedure Review  Request Date: Monday 06/21/20 Requesting Physician: Dr. Marius Ditch  PATIENT REVIEW QUESTIONS: The patient responded to the following health history questions as indicated:    1. Are you having any GI issues? no 2. Do you have a personal history of Polyps? no 3. Do you have a family history of Colon Cancer or Polyps? yes (mother and father colon polyps) 4. Diabetes Mellitus? no 5. Joint replacements in the past 12 months?no 6. Major health problems in the past 3 months?no 7. Any artificial heart valves, MVP, or defibrillator?no    MEDICATIONS & ALLERGIES:    Patient reports the following regarding taking any anticoagulation/antiplatelet therapy:   Plavix, Coumadin, Eliquis, Xarelto, Lovenox, Pradaxa, Brilinta, or Effient? no Aspirin? no  Patient confirms/reports the following medications:  Current Outpatient Medications  Medication Sig Dispense Refill  . ADVAIR HFA 115-21 MCG/ACT inhaler     . azelastine (ASTELIN) 0.1 % nasal spray Place into both nostrils 2 (two) times daily. Use in each nostril as directed    . fexofenadine (ALLEGRA) 180 MG tablet Take 180 mg by mouth daily.    . Foot Care Products (CORN CUSHIONS) PADS Use one pad daily. 18 each 0  . HYDROCORTISONE, TOPICAL, (TEXACORT) 2.5 % SOLN Apply 1 application topically daily. 3 days per week 30 mL 2  . levothyroxine (SYNTHROID) 75 MCG tablet Take 1 tablet (75 mcg total) by mouth daily. 90 tablet 3  . Multiple Vitamin (MULTIVITAMIN WITH MINERALS) TABS Take 1 tablet by mouth daily.    Marland Kitchen PROAIR HFA 108 (90 Base) MCG/ACT inhaler TAKE 2 PUFFS BY MOUTH EVERY 4 TO 6 HOURS AS NEEDED . MUST LAST 90 DAYS PER MD  0   No current facility-administered medications for this visit.    Patient confirms/reports the following allergies:  Allergies  Allergen Reactions  . Dust Mite Extract   . Other Other (See Comments)    Dust mites  . Pollen Extract     from trees and weeds    No orders of the  defined types were placed in this encounter.   AUTHORIZATION INFORMATION Primary Insurance: 1D#: Group #:  Secondary Insurance: 1D#: Group #:  SCHEDULE INFORMATION: Date: Monday 06/21/20 Time: Location:[facility]

## 2020-05-31 ENCOUNTER — Ambulatory Visit
Admission: RE | Admit: 2020-05-31 | Discharge: 2020-05-31 | Disposition: A | Discharge status: Home / Self Care | Payer: BC Managed Care – PPO | Source: Ambulatory Visit | Attending: Family Medicine | Admitting: Family Medicine

## 2020-05-31 ENCOUNTER — Other Ambulatory Visit: Payer: Self-pay

## 2020-05-31 ENCOUNTER — Ambulatory Visit: Payer: BC Managed Care – PPO

## 2020-05-31 DIAGNOSIS — Z8262 Family history of osteoporosis: Secondary | ICD-10-CM | POA: Diagnosis present

## 2020-06-01 ENCOUNTER — Other Ambulatory Visit: Payer: Self-pay | Admitting: Obstetrics & Gynecology

## 2020-06-01 ENCOUNTER — Encounter: Payer: Self-pay | Admitting: Family Medicine

## 2020-06-01 ENCOUNTER — Encounter: Payer: Self-pay | Admitting: Obstetrics & Gynecology

## 2020-06-01 ENCOUNTER — Ambulatory Visit (INDEPENDENT_AMBULATORY_CARE_PROVIDER_SITE_OTHER): Payer: BC Managed Care – PPO | Admitting: Obstetrics & Gynecology

## 2020-06-01 VITALS — BP 151/94 | HR 87

## 2020-06-01 DIAGNOSIS — R87810 Cervical high risk human papillomavirus (HPV) DNA test positive: Secondary | ICD-10-CM

## 2020-06-01 DIAGNOSIS — R87610 Atypical squamous cells of undetermined significance on cytologic smear of cervix (ASC-US): Secondary | ICD-10-CM | POA: Diagnosis not present

## 2020-06-01 DIAGNOSIS — E039 Hypothyroidism, unspecified: Secondary | ICD-10-CM

## 2020-06-01 LAB — COMPREHENSIVE METABOLIC PANEL
ALT: 18 IU/L (ref 0–32)
AST: 20 IU/L (ref 0–40)
Albumin/Globulin Ratio: 1.7 (ref 1.2–2.2)
Albumin: 4.5 g/dL (ref 3.8–4.8)
Alkaline Phosphatase: 115 IU/L (ref 44–121)
BUN/Creatinine Ratio: 14 (ref 12–28)
BUN: 11 mg/dL (ref 8–27)
Bilirubin Total: 0.5 mg/dL (ref 0.0–1.2)
CO2: 23 mmol/L (ref 20–29)
Calcium: 9.6 mg/dL (ref 8.7–10.3)
Chloride: 103 mmol/L (ref 96–106)
Creatinine, Ser: 0.76 mg/dL (ref 0.57–1.00)
Globulin, Total: 2.6 g/dL (ref 1.5–4.5)
Glucose: 95 mg/dL (ref 65–99)
Potassium: 4.4 mmol/L (ref 3.5–5.2)
Sodium: 143 mmol/L (ref 134–144)
Total Protein: 7.1 g/dL (ref 6.0–8.5)
eGFR: 89 mL/min/{1.73_m2} (ref >59)

## 2020-06-01 LAB — LIPID PANEL
Chol/HDL Ratio: 4 ratio (ref 0.0–4.4)
Cholesterol, Total: 262 mg/dL — ABNORMAL HIGH (ref 100–199)
HDL: 66 mg/dL (ref >39)
LDL Chol Calc (NIH): 169 mg/dL — ABNORMAL HIGH (ref 0–99)
Triglycerides: 151 mg/dL — ABNORMAL HIGH (ref 0–149)
VLDL Cholesterol Cal: 27 mg/dL (ref 5–40)

## 2020-06-01 LAB — CBC
Hematocrit: 45.5 % (ref 34.0–46.6)
Hemoglobin: 15.2 g/dL (ref 11.1–15.9)
MCH: 30.3 pg (ref 26.6–33.0)
MCHC: 33.4 g/dL (ref 31.5–35.7)
MCV: 91 fL (ref 79–97)
Platelets: 337 10*3/uL (ref 150–450)
RBC: 5.01 x10E6/uL (ref 3.77–5.28)
RDW: 13.2 % (ref 11.7–15.4)
WBC: 9 10*3/uL (ref 3.4–10.8)

## 2020-06-01 LAB — SEDIMENTATION RATE: Sed Rate: 2 mm/hr (ref 0–40)

## 2020-06-01 LAB — TSH: TSH: 2.65 u[IU]/mL (ref 0.450–4.500)

## 2020-06-01 LAB — C-REACTIVE PROTEIN: CRP: 8 mg/L (ref 0–10)

## 2020-06-01 LAB — SPECIMEN STATUS REPORT

## 2020-06-01 NOTE — Progress Notes (Signed)
    GYNECOLOGY OFFICE COLPOSCOPY PROCEDURE NOTE  60 y.o. G2P2 here for colposcopy for ASCUS with POSITIVE high risk HPV pap smear on 04/20/2020. Discussed role for HPV in cervical dysplasia, need for surveillance.  Patient gave informed written consent, time out was performed.  Placed in lithotomy position. Cervix viewed with speculum and colposcope after application of acetic acid.   Colposcopy adequate? Yes No visible lesions; no corresponding biopsy obtained.  ECC specimen obtained, labeled and sent to pathology.  Patient was given post procedure instructions.  Will follow up pathology and manage accordingly; patient will be contacted with results and recommendations.  Routine preventative health maintenance measures emphasized.    Verita Schneiders, MD, [place] for [employer], [place]

## 2020-06-01 NOTE — Patient Instructions (Signed)
Colposcopy, Care After This sheet gives you information about how to care for yourself after your procedure. Your health care provider may also give you more specific instructions. If you have problems or questions, contact your health care provider. What can I expect after the procedure? If you had a colposcopy without a biopsy, you can expect to feel fine right away after your procedure. However, you may have some spotting of blood for a few days. You can return to your normal activities. If you had a colposcopy with a biopsy, it is common after the procedure to have:  Soreness and mild pain. These may last for a few days.  Light-headedness.  Mild vaginal bleeding or discharge that is dark-colored and grainy. This may last for a few days. The discharge may be caused by a liquid (solution) that was used during the procedure. You may need to wear a sanitary pad during this time.  Spotting of blood for at least 48 hours after the procedure. Follow these instructions at home: Medicines  Take over-the-counter and prescription medicines only as told by your health care provider.  Talk with your health care provider about what type of over-the-counter pain medicine and prescription medicine you can start to take again. It is especially important to talk with your health care provider if you take blood thinners. Activity  Limit your physical activity for the first day after your procedure as told by your health care provider.  Avoid using douche products, using tampons, or having sex for at least 3 days after the procedure or for as long as told.  Return to your normal activities as told by your health care provider. Ask your health care provider what activities are safe for you. General instructions  Drink enough fluid to keep your urine pale yellow.  Ask your health care provider if you may take baths, swim, or use a hot tub. You may take showers.  If you use birth control  (contraception), continue to use it.  Keep all follow-up visits as told by your health care provider. This is important.   Contact a health care provider if:  You develop a skin rash. Get help right away if:  You bleed a lot from your vagina or pass blood clots. This includes using more than one sanitary pad each hour for 2 hours in a row.  You have a fever or chills.  You have vaginal discharge that is abnormal, is yellow in color, or smells bad. This could be a sign of infection.  You have severe pain or cramps in your lower abdomen that do not go away with medicine.  You faint. Summary  If you had a colposcopy without a biopsy, you can expect to feel fine right away, but you may have some spotting of blood for a few days. You can return to your normal activities.  If you had a colposcopy with a biopsy, it is common to have mild pain for a few days and spotting for 48 hours after the procedure.  Avoid using douche products, using tampons, and having sex for at least 3 days after the procedure or for as long as told by your health care provider.  Get help right away if you have heavy bleeding, severe pain, or signs of infection. This information is not intended to replace advice given to you by your health care provider. Make sure you discuss any questions you have with your health care provider. Document Revised: 02/26/2019 Document Reviewed: 02/26/2019 Elsevier  Elsevier Patient Education  2021 Elsevier Inc.   

## 2020-06-02 MED ORDER — LEVOTHYROXINE SODIUM 75 MCG PO TABS: 75.0000 ug | ORAL_TABLET | Freq: Every day | ORAL | 3 refills | Status: DC

## 2020-06-15 ENCOUNTER — Telehealth: Payer: Self-pay

## 2020-06-15 ENCOUNTER — Other Ambulatory Visit: Payer: Self-pay

## 2020-06-15 ENCOUNTER — Ambulatory Visit: Payer: BC Managed Care – PPO

## 2020-06-15 DIAGNOSIS — Z1211 Encounter for screening for malignant neoplasm of colon: Secondary | ICD-10-CM

## 2020-06-15 NOTE — Telephone Encounter (Signed)
Colonoscopy reschedule request due to positive COVID Test.  Patients call has been returned.  LVM for her to let her know that if I have the official COVID test results I can reschedule her 3 weeks out.  If I do not have the actual COVID test results I would need to reschedule her 3 months out.    Pt called back.  Colonoscopy has been rescheduled to Monday 08/23/20 [facility] with Dr. Marius Ditch.  Pt has been asked to email me her COVID test results dated 06/11/20.  Advised that she will not need another COVID test since she is within 90 days of her positive test.  New instructions will be sent to patient via mychart.

## 2020-06-17 ENCOUNTER — Other Ambulatory Visit: Admission: RE | Admit: 2020-06-17 | Payer: BC Managed Care – PPO | Source: Ambulatory Visit

## 2020-06-18 ENCOUNTER — Ambulatory Visit
Admission: RE | Admit: 2020-06-18 | Discharge: 2020-06-18 | Disposition: A | Discharge status: Home / Self Care | Payer: BC Managed Care – PPO | Source: Ambulatory Visit | Attending: Family Medicine | Admitting: Family Medicine

## 2020-06-18 ENCOUNTER — Other Ambulatory Visit: Payer: Self-pay

## 2020-06-18 VITALS — BP 161/97 | HR 71 | Temp 98.0°F | Resp 18

## 2020-06-18 DIAGNOSIS — R3915 Urgency of urination: Secondary | ICD-10-CM

## 2020-06-18 LAB — POCT URINALYSIS DIP (MANUAL ENTRY)
Bilirubin, UA: NEGATIVE
Glucose, UA: NEGATIVE mg/dL
Ketones, POC UA: NEGATIVE mg/dL
Nitrite, UA: NEGATIVE
Protein Ur, POC: NEGATIVE mg/dL
Spec Grav, UA: >=1.03 — AB (ref 1.010–1.025)
Urobilinogen, UA: 0.2 E.U./dL
pH, UA: 5 (ref 5.0–8.0)

## 2020-06-18 MED ORDER — CEPHALEXIN 500 MG PO CAPS: 500.0000 mg | ORAL_CAPSULE | Freq: Two times a day (BID) | ORAL | 0 refills | Status: AC

## 2020-06-18 NOTE — Discharge Instructions (Addendum)
Treating you for a urinary tract infection.  Take the antibiotics as prescribed.  Make sure you are drinking plenty of fluids. Follow up as needed for continued or worsening symptoms

## 2020-06-18 NOTE — ED Provider Notes (Signed)
[facility]    CSN: [id number] Arrival date & time: 06/18/20  1450      History   Chief Complaint No chief complaint on file.   HPI Danielle Duarte is a 60 y.o. female.   Patient is a 60 year old female that presents today with complaints of urinary urgency, frequency, low abdominal pressure and mild dysuria.  This started yesterday.  History of UTIs in the past.  No fever, chills, nausea, vomiting, back pain or flank pain.     Past Medical History:  Diagnosis Date  . Abnormal Pap smear   . Arthritis    right knee  . Dysplastic nevus 03/24/2019   spinal mid upper back, mod atypia   . Fibroids   . Normal cytology on  Pap smear with positive HPV test 06/12/2012   Repeat cotesting in one year   . Paroxysmal supraventricular tachycardia ([place]) 01/05/2014  . PONV (postoperative nausea and vomiting)     Patient Active Problem List   Diagnosis Date Noted  . Family history of osteoporosis 05/11/2020  . ASCUS with positive high risk HPV cervical pap on 04/20/2020 04/29/2020  . History of PSVT (paroxysmal supraventricular tachycardia) 08/26/2018  . Immune to varicella 04/09/2018  . Vaginal dryness, menopausal 10/25/2017  . Varicose veins of leg with swelling, 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/18/2017  . 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 Allergic rhinitis 03/30/2015  . Allergic asthma 03/30/2015  . Direct hernia 03/30/2015  . 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/30/2015  . HLD (hyperlipidemia) 03/30/2015  . BMI 34.0-34.9,adult 03/30/2015  . Beat, premature ventricular 03/30/2015  . Nail deformity 03/30/2015  . Degenerative arthritis of hip 01/14/2015  . Arthritis of knee, degenerative 01/14/2015  . Hypothyroid 12/07/2014  . 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 APPENDECTOMY    . COLONOSCOPY  10/10/2010  . COLPOSCOPY  03/01/10  . HERNIA REPAIR  November 2014  .  LAPAROSCOPIC LYSIS OF ADHESIONS  02/28/2012   Procedure: LAPAROSCOPIC LYSIS OF ADHESIONS;  Surgeon: Osborne Oman, MD;  Location: [facility];  Service: Gynecology;  Laterality: N/A;  . LAPAROSCOPY  02/28/2012   Procedure: LAPAROSCOPY OPERATIVE;  Surgeon: Osborne Oman, MD;  Location: [facility];  Service: Gynecology;  Laterality: N/A;  . SALPINGOOPHORECTOMY  02/28/2012   Procedure: SALPINGO OOPHORECTOMY;  Surgeon: Osborne Oman, MD;  Location: [facility];  Service: Gynecology;  Laterality: Left;  . TOTAL HIP ARTHROPLASTY  04/19/15   Left  . TOTAL KNEE ARTHROPLASTY Left 08/26/2018   Dr. Claiborne Billings at [facility]    OB History    Gravida  2   Para  2   Term      Preterm      AB      Living  2     SAB      IAB      Ectopic      Multiple      Live Births  2            Home Medications    Prior to Admission medications   Medication Sig Start Date End Date Taking? Authorizing Provider  ADVAIR [facility] 829-56 MCG/ACT inhaler  03/22/16   [provider]  azelastine (ASTELIN) 0.1 % nasal spray Place into both nostrils 2 (two) times daily. Use in each nostril as directed    [provider]  cephALEXin (KEFLEX) 500 MG capsule Take 1 capsule (  500 mg total) by mouth 2 (two) times daily for 5 days. 06/18/20 06/23/20 Yes Myeesha Shane A, NP  fexofenadine (ALLEGRA) 180 MG tablet Take 180 mg by mouth daily.    [provider]  Foot Care Products (CORN CUSHIONS) PADS Use one pad daily. 01/09/20   Trinna Post, PA-C  HYDROCORTISONE, TOPICAL, (TEXACORT) 2.5 % SOLN Apply 1 application topically daily. 3 days per week 04/26/20   Ralene Bathe, MD  levothyroxine (SYNTHROID) 75 MCG tablet Take 1 tablet (75 mcg total) by mouth daily. 06/02/20   Birdie Sons, MD  Multiple Vitamin (MULTIVITAMIN WITH MINERALS) TABS Take 1 tablet by mouth daily.    [provider]  PROAIR HFA 108 (90 Base) MCG/ACT inhaler TAKE 2 PUFFS BY MOUTH EVERY 4 TO 6 HOURS AS NEEDED . MUST LAST 90  DAYS PER MD 08/10/15   [provider]    Family History Family History  Problem Relation Age of Onset  . Hypertension Mother   . Hypertension Father   . Diabetes Paternal Uncle     Social History Social History   Tobacco Use  . Smoking status: Former Smoker    Quit date: 03/13/1987    Years since quitting: 33.2  . Smokeless tobacco: Never Used  Vaping Use  . Vaping Use: Never used  Substance Use Topics  . Alcohol use: No  . Drug use: No     Allergies   Dust mite extract, Other, and Pollen extract   Review of Systems Review of Systems   Physical Exam Triage Vital Signs ED Triage Vitals [06/18/20 1508]  Enc Vitals Group     BP (!) 161/97     Pulse Rate 71     Resp 18     Temp 98 F (36.7 C)     Temp Source Oral     SpO2 96 %     Weight      Height      Head Circumference      Peak Flow      Pain Score      Pain Loc      Pain Edu?      Excl. in [place]?    No data found.  Updated Vital Signs BP (!) 161/97 (BP Location: Left Arm)   Pulse 71   Temp 98 F (36.7 C) (Oral)   Resp 18   LMP 12/04/2011   SpO2 96%   Visual Acuity Right Eye Distance:   Left Eye Distance:   Bilateral Distance:    Right Eye Near:   Left Eye Near:    Bilateral Near:     Physical Exam Vitals and nursing note reviewed.  Constitutional:      General: She is not in acute distress.    Appearance: Normal appearance. She is not ill-appearing, toxic-appearing or diaphoretic.  HENT:     Head: Normocephalic.  Eyes:     Conjunctiva/sclera: Conjunctivae normal.  Cardiovascular:     Rate and Rhythm: Normal rate and regular rhythm.  Pulmonary:     Effort: Pulmonary effort is normal.     Breath sounds: Normal breath sounds.  Musculoskeletal:        General: Normal range of motion.     Cervical back: Normal range of motion.  Skin:    General: Skin is warm and dry.     Findings: No rash.  Neurological:     Mental Status: She is alert.  Psychiatric:  Mood and  Affect: Mood normal.      UC Treatments / Results  Labs (all labs ordered are listed, but only abnormal results are displayed) Labs Reviewed  POCT URINALYSIS DIP (MANUAL ENTRY) - Abnormal; Notable for the following components:      Result Value   Spec Grav, UA >=1.030 (*)    Blood, UA trace-intact (*)    Leukocytes, UA Trace (*)    All other components within normal limits  URINE CULTURE    EKG   Radiology No results found.  Procedures Procedures (including critical care time)  Medications Ordered in UC Medications - No data to display  Initial Impression / Assessment and Plan / UC Course  I have reviewed the triage vital signs and the nursing notes.  Pertinent labs & imaging results that were available during my care of the patient were reviewed by me and considered in my medical decision making (see chart for details).     Urinary urgency Treating for urinary tract infection.  Urine with trace leuks and trace blood.  Sending for culture. Recommended push fluids. Follow up as needed for continued or worsening symptoms  Final Clinical Impressions(s) / UC Diagnoses   Final diagnoses:  Urinary urgency     Discharge Instructions      Treating you for a urinary tract infection.  Take the antibiotics as prescribed.  Make sure you are drinking plenty of fluids. Follow up as needed for continued or worsening symptoms     ED Prescriptions    Medication Sig Dispense Auth. Provider   cephALEXin (KEFLEX) 500 MG capsule Take 1 capsule (500 mg total) by mouth 2 (two) times daily for 5 days. 10 capsule Loura Halt A, NP     PDMP not reviewed this encounter.   Orvan July, NP 06/18/20 1528

## 2020-06-20 LAB — URINE CULTURE: Culture: 10000 — AB

## 2020-06-21 ENCOUNTER — Ambulatory Visit
Admission: RE | Admit: 2020-06-21 | Payer: BC Managed Care – PPO | Source: Home / Self Care | Admitting: Gastroenterology

## 2020-06-21 ENCOUNTER — Encounter: Admission: RE | Payer: Self-pay | Source: Home / Self Care

## 2020-06-21 SURGERY — COLONOSCOPY WITH PROPOFOL
Anesthesia: General

## 2020-07-01 ENCOUNTER — Ambulatory Visit: Payer: Self-pay

## 2020-07-01 ENCOUNTER — Other Ambulatory Visit: Payer: Self-pay

## 2020-07-01 ENCOUNTER — Telehealth: Payer: Self-pay

## 2020-07-01 DIAGNOSIS — Z1211 Encounter for screening for malignant neoplasm of colon: Secondary | ICD-10-CM

## 2020-07-01 MED ORDER — PEG 3350-KCL-NA BICARB-NACL 420 G PO SOLR: 4000.0000 mL | Freq: Once | ORAL | 0 refills | Status: AC

## 2020-07-01 NOTE — Telephone Encounter (Signed)
Pt is calling to recovering from [place] with a dx of 4/122. Pt reports extreme fatigue still after the covid dx and wants to know what she should do. Pt. Reports she was diagnosed with COVID 19 06/11/20. Still has fatigue, mild shortness of breath. Would like to be seen tomorrow. No availability. Pt. Will go to UC. Reason for Disposition . [1] MILD difficulty breathing (e.g., minimal/no SOB at rest, SOB with walking, pulse <100) AND [2] new-onset  Answer Assessment - Initial Assessment Questions 1. COVID-19 ONSET: "When did the symptoms of COVID-19 first start?"     First of April 2. DIAGNOSIS CONFIRMATION: "How were you diagnosed?" (e.g., COVID-19 oral or nasal viral test; COVID-19 antibody test; doctor visit)     PCR 3. MAIN SYMPTOM:  "What is your main concern or symptom right now?" (e.g., breathing difficulty, cough, fatigue. loss of smell)     Fatigue 4. SYMPTOM ONSET: "When did the  symptoms  start?"     First of April 5. BETTER-SAME-WORSE: "Are you getting better, staying the same, or getting worse over the last 1 to 2 weeks?"     Better 6. RECENT MEDICAL VISIT: "Have you been seen by a healthcare provider (doctor, NP, PA) for these persisting COVID-19 symptoms?" If Yes, ask: "When were you seen?" (e.g., date)     No 7. COUGH: "Do you have a cough?" If Yes, ask: "How bad is the cough?"       No 8. FEVER: "Do you have a fever?" If Yes, ask: "What is your temperature, how was it measured, and when did it start?"     No 9. BREATHING DIFFICULTY: "Are you having any trouble breathing?" If Yes, ask: "How bad is your breathing?" (e.g., mild, moderate, severe)    - MILD: No SOB at rest, mild SOB with walking, speaks normally in sentences, can lie down, no retractions, pulse < 100.    - MODERATE: SOB at rest, SOB with minimal exertion and prefers to sit, cannot lie down flat, speaks in phrases, mild retractions, audible wheezing, pulse 100-120.    - SEVERE: Very SOB at rest, speaks in single  words, struggling to breathe, sitting hunched forward, retractions, pulse > 120       Mild 10. HIGH RISK DISEASE: "Do you have any chronic medical problems?" (e.g., asthma, heart or lung disease, weak immune system, obesity, etc.)       Asthma 11. VACCINE: "Have you gotten the COVID-19 vaccine?" If Yes, ask: "Which one, how many shots, when did you get it?"       Yes x 3  Pfizer 12. BOOSTER: "Have you received your COVID-19 booster?" If Yes, ask: "Which one and when did you get it?"       Yes 13. PREGNANCY: "Is there any chance you are pregnant?" "When was your last menstrual period?"       No 14. OTHER SYMPTOMS: "Do you have any other symptoms?"  (e.g., fatigue, headache, muscle pain, weakness)       Fatigue 15. O2 SATURATION MONITOR:  "Do you use an oxygen saturation monitor (pulse oximeter) at home?" If Yes, ask "What is your reading (oxygen level) today?" "What is your usual oxygen saturation reading?" (e.g., 95%)       No  Protocols used: CORONAVIRUS (COVID-19) PERSISTING SYMPTOMS FOLLOW-UP CALL-A-AH

## 2020-07-01 NOTE — Telephone Encounter (Signed)
Patients call has been returned in regards to rescheduling colonoscopy.  Colonoscopy has been rescheduled to 07/26/20.  Instructions updated and printed.  Thanks,  [place], [place]

## 2020-07-02 ENCOUNTER — Ambulatory Visit (INDEPENDENT_AMBULATORY_CARE_PROVIDER_SITE_OTHER): Payer: BC Managed Care – PPO

## 2020-07-02 ENCOUNTER — Other Ambulatory Visit: Payer: Self-pay

## 2020-07-02 ENCOUNTER — Ambulatory Visit
Admission: RE | Admit: 2020-07-02 | Discharge: 2020-07-02 | Disposition: A | Discharge status: Home / Self Care | Payer: BC Managed Care – PPO | Source: Ambulatory Visit | Attending: Emergency Medicine | Admitting: Emergency Medicine

## 2020-07-02 VITALS — BP 157/60 | HR 72 | Temp 98.3°F | Resp 16 | Wt 195.0 lb

## 2020-07-02 DIAGNOSIS — R5383 Other fatigue: Secondary | ICD-10-CM | POA: Diagnosis not present

## 2020-07-02 DIAGNOSIS — R030 Elevated blood-pressure reading, without diagnosis of hypertension: Secondary | ICD-10-CM

## 2020-07-02 DIAGNOSIS — R0602 Shortness of breath: Secondary | ICD-10-CM

## 2020-07-02 DIAGNOSIS — U071 COVID-19: Secondary | ICD-10-CM

## 2020-07-02 DIAGNOSIS — J1282 Pneumonia due to coronavirus disease 2019: Secondary | ICD-10-CM

## 2020-07-02 MED ORDER — AZITHROMYCIN 250 MG PO TABS: 250.0000 mg | ORAL_TABLET | Freq: Every day | ORAL | 0 refills | Status: DC

## 2020-07-02 NOTE — ED Provider Notes (Signed)
Roderic Palau    CSN: [id number] Arrival date & time: 07/02/20  0849      History   Chief Complaint Chief Complaint  Patient presents with  . Fatigue    HPI Danielle Duarte is a 60 y.o. female.   Patient presents with ongoing fatigue and shortness of breath since she tested COVID positive on 4/1/12022.  She reports occasional mild cough productive of clear sputum.  She denies rash, fever, chills, sore throat, chest pain, abdominal pain, vomiting, diarrhea, or other symptoms.  OTC treatment attempted at home and also using her albuterol inhaler.  Her medical history includes allergic rhinitis, asthma, SVT, arthritis.  The history is provided by the patient and medical records.    Past Medical History:  Diagnosis Date  . Abnormal Pap smear   . Arthritis    right knee  . Dysplastic nevus 03/24/2019   spinal mid upper back, mod atypia   . Fibroids   . Normal cytology on  Pap smear with positive HPV test 06/12/2012   Repeat cotesting in one year   . Paroxysmal supraventricular tachycardia ([place]) 01/05/2014  . PONV (postoperative nausea and vomiting)     Patient Active Problem List   Diagnosis Date Noted  . Family history of osteoporosis 05/11/2020  . ASCUS with positive high risk HPV cervical pap on 04/20/2020 04/29/2020  . History of PSVT (paroxysmal supraventricular tachycardia) 08/26/2018  . Immune to varicella 04/09/2018  . Vaginal dryness, menopausal 10/25/2017  . Varicose veins of leg with swelling, 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/18/2017  . 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 Allergic rhinitis 03/30/2015  . Allergic asthma 03/30/2015  . Direct hernia 03/30/2015  . 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/30/2015  . HLD (hyperlipidemia) 03/30/2015  . BMI 34.0-34.9,adult 03/30/2015  . Beat, premature ventricular 03/30/2015  . Nail deformity 03/30/2015  . Degenerative arthritis of hip 01/14/2015  . Arthritis of knee, degenerative 01/14/2015  .  Hypothyroid 12/07/2014  . 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 APPENDECTOMY    . COLONOSCOPY  10/10/2010  . COLPOSCOPY  03/01/10  . HERNIA REPAIR  November 2014  . LAPAROSCOPIC LYSIS OF ADHESIONS  02/28/2012   Procedure: LAPAROSCOPIC LYSIS OF ADHESIONS;  Surgeon: Osborne Oman, MD;  Location: [facility];  Service: Gynecology;  Laterality: N/A;  . LAPAROSCOPY  02/28/2012   Procedure: LAPAROSCOPY OPERATIVE;  Surgeon: Osborne Oman, MD;  Location: [facility];  Service: Gynecology;  Laterality: N/A;  . SALPINGOOPHORECTOMY  02/28/2012   Procedure: SALPINGO OOPHORECTOMY;  Surgeon: Osborne Oman, MD;  Location: [facility];  Service: Gynecology;  Laterality: Left;  . TOTAL HIP ARTHROPLASTY  04/19/15   Left  . TOTAL KNEE ARTHROPLASTY Left 08/26/2018   Dr. Claiborne Billings at [facility]    OB History    Gravida  2   Para  2   Term      Preterm      AB      Living  2     SAB      IAB      Ectopic      Multiple      Live Births  2            Home Medications    Prior to Admission medications   Medication Sig Start Date End Date Taking? Authorizing Provider  azithromycin (ZITHROMAX) 250 MG tablet Take 1 tablet (250 mg  total) by mouth daily. Take first 2 tablets together, then 1 every day until finished. 07/02/20  Yes Sharion Balloon, NP  ADVAIR [facility] 115-21 MCG/ACT inhaler  03/22/16   [provider]  azelastine (ASTELIN) 0.1 % nasal spray Place into both nostrils 2 (two) times daily. Use in each nostril as directed    [provider]  fexofenadine (ALLEGRA) 180 MG tablet Take 180 mg by mouth daily.    [provider]  Foot Care Products (CORN CUSHIONS) PADS Use one pad daily. 01/09/20   Trinna Post, PA-C  HYDROCORTISONE, TOPICAL, (TEXACORT) 2.5 % SOLN Apply 1 application topically daily. 3 days per week 04/26/20   Ralene Bathe, MD  levothyroxine (SYNTHROID) 75 MCG tablet  Take 1 tablet (75 mcg total) by mouth daily. 06/02/20   Birdie Sons, MD  Multiple Vitamin (MULTIVITAMIN WITH MINERALS) TABS Take 1 tablet by mouth daily.    [provider]  PROAIR HFA 108 (90 Base) MCG/ACT inhaler TAKE 2 PUFFS BY MOUTH EVERY 4 TO 6 HOURS AS NEEDED . MUST LAST 90 DAYS PER MD 08/10/15   [provider]    Family History Family History  Problem Relation Age of Onset  . Hypertension Mother   . Hypertension Father   . Diabetes Paternal Uncle     Social History Social History   Tobacco Use  . Smoking status: Former Smoker    Quit date: 03/13/1987    Years since quitting: 33.3  . Smokeless tobacco: Never Used  Vaping Use  . Vaping Use: Never used  Substance Use Topics  . Alcohol use: No  . Drug use: No     Allergies   Dust mite extract, Other, and Pollen extract   Review of Systems Review of Systems  Constitutional: Positive for fatigue. Negative for chills and fever.  HENT: Negative for ear pain and sore throat.   Eyes: Negative for pain and visual disturbance.  Respiratory: Positive for cough and shortness of breath.   Cardiovascular: Negative for chest pain and palpitations.  Gastrointestinal: Negative for abdominal pain, diarrhea and vomiting.  Genitourinary: Negative for dysuria and hematuria.  Musculoskeletal: Negative for arthralgias and back pain.  Skin: Negative for color change and rash.  Neurological: Negative for seizures and syncope.  All other systems reviewed and are negative.    Physical Exam Triage Vital Signs ED Triage Vitals  Enc Vitals Group     BP      Pulse      Resp      Temp      Temp src      SpO2      Weight      Height      Head Circumference      Peak Flow      Pain Score      Pain Loc      Pain Edu?      Excl. in [place]?    No data found.  Updated Vital Signs BP (S) (!) 157/60 (BP Location: Left Arm)   Pulse 72   Temp 98.3 F (36.8 C) (Temporal)   Resp 16   Wt 195 lb (88.5 kg)   LMP  12/04/2011   SpO2 97%   BMI 32.95 kg/m   Visual Acuity Right Eye Distance:   Left Eye Distance:   Bilateral Distance:    Right Eye Near:   Left Eye Near:    Bilateral Near:     Physical Exam Vitals and  nursing note reviewed.  Constitutional:      General: She is not in acute distress.    Appearance: She is well-developed. She is not ill-appearing.  HENT:     Head: Normocephalic and atraumatic.     Right Ear: Tympanic membrane normal.     Left Ear: Tympanic membrane normal.     Nose: Nose normal.     Mouth/Throat:     Mouth: Mucous membranes are moist.     Pharynx: Oropharynx is clear.  Eyes:     Conjunctiva/sclera: Conjunctivae normal.  Cardiovascular:     Rate and Rhythm: Normal rate and regular rhythm.     Heart sounds: Normal heart sounds.  Pulmonary:     Effort: Pulmonary effort is normal. No respiratory distress.     Breath sounds: Normal breath sounds. No wheezing.  Abdominal:     General: Bowel sounds are normal.     Palpations: Abdomen is soft.     Tenderness: There is no abdominal tenderness. There is no guarding or rebound.  Musculoskeletal:     Cervical back: Neck supple.  Skin:    General: Skin is warm and dry.     Findings: No rash.  Neurological:     General: No focal deficit present.     Mental Status: She is alert and oriented to person, place, and time.     Gait: Gait normal.  Psychiatric:        Mood and Affect: Mood normal.        Behavior: Behavior normal.      UC Treatments / Results  Labs (all labs ordered are listed, but only abnormal results are displayed) Labs Reviewed  CBC  COMPREHENSIVE METABOLIC PANEL    EKG   Radiology DG Chest 2 View  Result Date: 07/02/2020 CLINICAL DATA:  Shortness of breath. Recent coronavirus infection. Cough. Fatigue. EXAM: CHEST - 2 VIEW COMPARISON:  10/08/2013 FINDINGS: Heart and mediastinal shadows are normal. The lungs show minimal scattered patchy density that could go along with mild viral  pneumonia. No dense consolidation, collapse or effusion. Bony structures are unremarkable. IMPRESSION: Possible mild viral pneumonia. No dense consolidation or collapse. Electronically Signed   By: Nelson Chimes M.D.   On: 07/02/2020 10:13    Procedures Procedures (including critical care time)  Medications Ordered in UC Medications - No data to display  Initial Impression / Assessment and Plan / UC Course  I have reviewed the triage vital signs and the nursing notes.  Pertinent labs & imaging results that were available during my care of the patient were reviewed by me and considered in my medical decision making (see chart for details).   Pneumonia due to COVID-19, fatigue, shortness of breath, COVID-19, elevated blood pressure reading.  Patient is well-appearing and vital signs stable; her exam is reassuring, no wheezing or respiratory distress, O2 sat 97%.  CBC and CMP pending.  Chest x-ray shows pneumonia.  Treating with Zithromax and continued use of albuterol inhaler.  Symptomatic treatment discussed, including rest and hydration.  ED precautions discussed if worsening symptoms.  Instructed patient to follow-up with her PCP 1 to 2 weeks.  Also discussed that her blood pressure is elevated today and needs to be rechecked by her PCP in 2 to 4 weeks.  She agrees to plan of care.   Final Clinical Impressions(s) / UC Diagnoses   Final diagnoses:  Fatigue, unspecified type  SOB (shortness of breath)  COVID-19  Elevated blood pressure reading  Pneumonia due to  COVID-19 virus     Discharge Instructions     Take the Zithromax as directed.  Continue using your albuterol inhaler.  Follow up with your primary care provider in 1 to 2 weeks.  Your blood pressure is elevated today at 157/60.  Please have this rechecked by your primary care provider in 2-4 weeks.         ED Prescriptions    Medication Sig Dispense Auth. Provider   azithromycin (ZITHROMAX) 250 MG tablet Take 1 tablet (250  mg total) by mouth daily. Take first 2 tablets together, then 1 every day until finished. 6 tablet Sharion Balloon, NP     PDMP not reviewed this encounter.   Sharion Balloon, NP 07/02/20 1020

## 2020-07-02 NOTE — ED Triage Notes (Signed)
Patient presents to Urgent Care with complaints of continued fatigue, SOB since 04/01.She states she tested positive for covid and unsure if related to covid or her allergies. Treating her symptoms with her inhaler and allergy meds.   Denies fever pr chest pain.Marland Kitchen

## 2020-07-02 NOTE — Discharge Instructions (Addendum)
Take the Zithromax as directed.  Continue using your albuterol inhaler.  Follow up with your primary care provider in 1 to 2 weeks.  Your blood pressure is elevated today at 157/60.  Please have this rechecked by your primary care provider in 2-4 weeks.

## 2020-07-03 LAB — COMPREHENSIVE METABOLIC PANEL
ALT: 22 IU/L (ref 0–32)
AST: 21 IU/L (ref 0–40)
Albumin/Globulin Ratio: 2 (ref 1.2–2.2)
Albumin: 4.5 g/dL (ref 3.8–4.8)
Alkaline Phosphatase: 111 IU/L (ref 44–121)
BUN/Creatinine Ratio: 9 — ABNORMAL LOW (ref 12–28)
BUN: 7 mg/dL — ABNORMAL LOW (ref 8–27)
Bilirubin Total: 0.6 mg/dL (ref 0.0–1.2)
CO2: 24 mmol/L (ref 20–29)
Calcium: 9.3 mg/dL (ref 8.7–10.3)
Chloride: 105 mmol/L (ref 96–106)
Creatinine, Ser: 0.79 mg/dL (ref 0.57–1.00)
Globulin, Total: 2.2 g/dL (ref 1.5–4.5)
Glucose: 96 mg/dL (ref 65–99)
Potassium: 4.7 mmol/L (ref 3.5–5.2)
Sodium: 144 mmol/L (ref 134–144)
Total Protein: 6.7 g/dL (ref 6.0–8.5)
eGFR: 85 mL/min/{1.73_m2} (ref >59)

## 2020-07-03 LAB — CBC
Hematocrit: 44 % (ref 34.0–46.6)
Hemoglobin: 14.4 g/dL (ref 11.1–15.9)
MCH: 29.5 pg (ref 26.6–33.0)
MCHC: 32.7 g/dL (ref 31.5–35.7)
MCV: 90 fL (ref 79–97)
Platelets: 338 10*3/uL (ref 150–450)
RBC: 4.88 x10E6/uL (ref 3.77–5.28)
RDW: 13.5 % (ref 11.7–15.4)
WBC: 9 10*3/uL (ref 3.4–10.8)

## 2020-07-20 ENCOUNTER — Ambulatory Visit: Payer: BC Managed Care – PPO | Admitting: Family Medicine

## 2020-07-26 ENCOUNTER — Other Ambulatory Visit: Payer: Self-pay

## 2020-07-26 ENCOUNTER — Ambulatory Visit: Payer: BC Managed Care – PPO | Admitting: Anesthesiology

## 2020-07-26 ENCOUNTER — Ambulatory Visit
Admission: RE | Admit: 2020-07-26 | Discharge: 2020-07-26 | Disposition: A | Discharge status: Home / Self Care | Payer: BC Managed Care – PPO | Attending: Gastroenterology | Admitting: Gastroenterology

## 2020-07-26 ENCOUNTER — Encounter: Payer: Self-pay | Admitting: Gastroenterology

## 2020-07-26 ENCOUNTER — Encounter
Admission: RE | Disposition: A | Discharge status: Home / Self Care | Payer: Self-pay | Source: Home / Self Care | Attending: Gastroenterology

## 2020-07-26 DIAGNOSIS — Z1211 Encounter for screening for malignant neoplasm of colon: Secondary | ICD-10-CM

## 2020-07-26 DIAGNOSIS — Z96642 Presence of left artificial hip joint: Secondary | ICD-10-CM | POA: Diagnosis not present

## 2020-07-26 DIAGNOSIS — Z79899 Other long term (current) drug therapy: Secondary | ICD-10-CM | POA: Diagnosis not present

## 2020-07-26 DIAGNOSIS — K5730 Diverticulosis of large intestine without perforation or abscess without bleeding: Secondary | ICD-10-CM | POA: Insufficient documentation

## 2020-07-26 DIAGNOSIS — D120 Benign neoplasm of cecum: Secondary | ICD-10-CM | POA: Insufficient documentation

## 2020-07-26 DIAGNOSIS — Z7951 Long term (current) use of inhaled steroids: Secondary | ICD-10-CM | POA: Diagnosis not present

## 2020-07-26 DIAGNOSIS — Z96652 Presence of left artificial knee joint: Secondary | ICD-10-CM | POA: Insufficient documentation

## 2020-07-26 DIAGNOSIS — Z79890 Hormone replacement therapy (postmenopausal): Secondary | ICD-10-CM | POA: Insufficient documentation

## 2020-07-26 DIAGNOSIS — Z87891 Personal history of nicotine dependence: Secondary | ICD-10-CM | POA: Insufficient documentation

## 2020-07-26 DIAGNOSIS — K635 Polyp of colon: Secondary | ICD-10-CM | POA: Diagnosis not present

## 2020-07-26 HISTORY — PX: COLONOSCOPY WITH PROPOFOL: SHX5780

## 2020-07-26 SURGERY — COLONOSCOPY WITH PROPOFOL
Anesthesia: General

## 2020-07-26 MED ORDER — LIDOCAINE 2% (20 MG/ML) 5 ML SYRINGE
INTRAMUSCULAR | Status: DC | PRN
  Administered 2020-07-26: 25 mg via INTRAVENOUS

## 2020-07-26 MED ORDER — PROPOFOL 500 MG/50ML IV EMUL
INTRAVENOUS | Status: AC
  Filled 2020-07-26: qty 50

## 2020-07-26 MED ORDER — SODIUM CHLORIDE 0.9 % IV SOLN
INTRAVENOUS | Status: DC
  Administered 2020-07-26: 20 mL/h via INTRAVENOUS

## 2020-07-26 MED ORDER — PROPOFOL 500 MG/50ML IV EMUL
INTRAVENOUS | Status: DC | PRN
  Administered 2020-07-26: 120 ug/kg/min via INTRAVENOUS

## 2020-07-26 MED ORDER — PROPOFOL 10 MG/ML IV BOLUS
INTRAVENOUS | Status: DC | PRN
  Administered 2020-07-26: 30 mg via INTRAVENOUS
  Administered 2020-07-26: 70 mg via INTRAVENOUS

## 2020-07-26 MED ORDER — MIDAZOLAM HCL 2 MG/2ML IJ SOLN
INTRAMUSCULAR | Status: AC
  Filled 2020-07-26: qty 2

## 2020-07-26 MED ORDER — MIDAZOLAM HCL 5 MG/5ML IJ SOLN
INTRAMUSCULAR | Status: DC | PRN
  Administered 2020-07-26: 2 mg via INTRAVENOUS

## 2020-07-26 NOTE — Transfer of Care (Signed)
Immediate Anesthesia Transfer of Care Note  Patient: Danielle Duarte  Procedure(s) Performed: COLONOSCOPY WITH PROPOFOL (N/A )  Patient Location: Endoscopy Unit  Anesthesia Type:General  Level of Consciousness: drowsy  Airway & Oxygen Therapy: Patient Spontanous Breathing  Post-op Assessment: Report given to RN and Post -op Vital signs reviewed and stable  Post vital signs: Reviewed  Last Vitals:  Vitals Value Taken Time  BP 138/76 07/26/20 0936  Temp 36.1 C 07/26/20 0936  Pulse 75 07/26/20 0937  Resp 20 07/26/20 0937  SpO2 92 % 07/26/20 0937  Vitals shown include unvalidated device data.  Last Pain:  Vitals:   07/26/20 0936  TempSrc: Tympanic  PainSc: Asleep         Complications: No complications documented.

## 2020-07-26 NOTE — Op Note (Signed)
[facility]astroenter[facility] Patient Name: Danielle Duarte Procedure Date: 07/26/2020 9:14 AM MRN: [id number] Account #: [id number] Date of Birth: 1960-03-14 Admit Type: Outpatient Age: 60 Room: [facility] ENDO ROOM 2 Gender: Female Note Status: Finalized Procedure:             Colonoscopy Indications:           Screening for colorectal malignant neoplasm Providers:             Lin Landsman MD, MD Medicines:             General Anesthesia Complications:         No immediate complications. Estimated blood loss: None. Procedure:             Pre-Anesthesia Assessment:                        - Prior to the procedure, a History and Physical was                         performed, and patient medications and allergies were                         reviewed. The patient is competent. The risks and                         benefits of the procedure and the sedation options and                         risks were discussed with the patient. All questions                         were answered and informed consent was obtained.                         Patient identification and proposed procedure were                         verified by the physician, the nurse, the                         anesthesiologist, the anesthetist and the technician                         in the pre-procedure area in the procedure room in the                         endoscopy suite. Mental Status Examination: alert and                         oriented. Airway Examination: normal oropharyngeal                         airway and neck mobility. Respiratory Examination:                         clear to auscultation. CV Examination: normal.                         Prophylactic Antibiotics: The patient does not require  prophylactic antibiotics. Prior Anticoagulants: The                         patient has taken no previous anticoagulant or                         antiplatelet agents. ASA  Grade Assessment: II - A                         patient with mild systemic disease. After reviewing                         the risks and benefits, the patient was deemed in                         satisfactory condition to undergo the procedure. The                         anesthesia plan was to use general anesthesia.                         Immediately prior to administration of medications,                         the patient was re-assessed for adequacy to receive                         sedatives. The heart rate, respiratory rate, oxygen                         saturations, blood pressure, adequacy of pulmonary                         ventilation, and response to care were monitored                         throughout the procedure. The physical status of the                         patient was re-assessed after the procedure.                        After obtaining informed consent, the colonoscope was                         passed under direct vision. Throughout the procedure,                         the patient's blood pressure, pulse, and oxygen                         saturations were monitored continuously. The                         Colonoscope was introduced through the anus and                         advanced to the the cecum, identified by appendiceal  orifice and ileocecal valve. The colonoscopy was                         performed without difficulty. The patient tolerated                         the procedure well. The quality of the bowel                         preparation was evaluated using the BBPS [facility] Bowel                         Preparation Scale) with scores of: Right Colon = 2                         (minor amount of residual staining, small fragments of                         stool and/or opaque liquid, but mucosa seen well),                         Transverse Colon = 3 (entire mucosa seen well with no                          residual staining, small fragments of stool or opaque                         liquid) and Left Colon = 3 (entire mucosa seen well                         with no residual staining, small fragments of stool or                         opaque liquid). The total BBPS score equals 8. Findings:      The perianal and digital rectal examinations were normal. Pertinent       negatives include normal sphincter tone and no palpable rectal lesions.      A 6 mm polyp was found in the cecum. The polyp was sessile. The polyp       was removed with a cold snare. Resection and retrieval were complete.       Estimated blood loss: none.      Multiple diverticula were found in the entire colon.      The retroflexed view of the distal rectum and anal verge was normal and       showed no anal or rectal abnormalities. Impression:            - One 6 mm polyp in the cecum, removed with a cold                         snare. Resected and retrieved.                        - Diverticulosis in the entire examined colon.                        - The distal rectum and anal verge are normal on  retroflexion view. Recommendation:        - Discharge patient to home (with escort).                        - Resume previous diet today.                        - Continue present medications.                        - Await pathology results.                        - Repeat colonoscopy in 3 years for surveillance with                         2 day prep due to fair prep in right colon . Procedure Code(s):     --- Professional ---                        [phone], Colonoscopy, flexible; with removal of                         tumor(s), polyp(s), or other lesion(s) by snare                         technique Diagnosis Code(s):     --- Professional ---                        Z12.11, Encounter for screening for malignant neoplasm                         of colon                        K63.5, Polyp of colon                         K57.30, Diverticulosis of large intestine without                         perforation or abscess without bleeding CPT copyright 2019 American Medical Association. All rights reserved. The codes documented in this report are preliminary and upon coder review may  be revised to meet current compliance requirements. Dr. Ulyess Mort Lin Landsman MD, MD 07/26/2020 9:36:24 AM This report has been signed electronically. Number of Addenda: 0 Note Initiated On: 07/26/2020 9:14 AM Scope Withdrawal Time: 0 hours 11 minutes 5 seconds  Total Procedure Duration: 0 hours 13 minutes 39 seconds  Estimated Blood Loss:  Estimated blood loss: none.      [facility]

## 2020-07-26 NOTE — Anesthesia Postprocedure Evaluation (Signed)
Anesthesia Post Note  Patient: Preciosa A Trudell  Procedure(s) Performed: COLONOSCOPY WITH PROPOFOL (N/A )  Patient location during evaluation: Endoscopy Anesthesia Type: General Level of consciousness: awake and alert Pain management: pain level controlled Vital Signs Assessment: post-procedure vital signs reviewed and stable Respiratory status: spontaneous breathing, nonlabored ventilation, respiratory function stable and patient connected to nasal cannula oxygen Cardiovascular status: blood pressure returned to baseline and stable Postop Assessment: no apparent nausea or vomiting Anesthetic complications: no   No complications documented.   Last Vitals:  Vitals:   07/26/20 0956 07/26/20 1006  BP: 132/79 135/77  Pulse: 65 61  Resp: 18 (!) 21  Temp:    SpO2: 98% 100%    Last Pain:  Vitals:   07/26/20 1006  TempSrc:   PainSc: 0-No pain                 Martha Clan

## 2020-07-26 NOTE — H&P (Signed)
Cephas Darby, MD [address]  [place]  [place], [place] [address]  Main: [phone]  Fax: [phone] Pager: [phone]  Primary Care Physician:  Birdie Sons, MD Primary Gastroenterologist:  Dr. Cephas Darby  Pre-Procedure History & Physical: HPI:  Danielle Duarte is a 60 y.o. female is here for an colonoscopy.   Past Medical History:  Diagnosis Date  . Abnormal Pap smear   . Arthritis    right knee  . Dysplastic nevus 03/24/2019   spinal mid upper back, mod atypia   . Fibroids   . Normal cytology on  Pap smear with positive HPV test 06/12/2012   Repeat cotesting in one year   . Paroxysmal supraventricular tachycardia ([place]) 01/05/2014  . PONV (postoperative nausea and vomiting)     Past Surgical History:  Procedure Laterality Date  . APPENDECTOMY    . COLONOSCOPY  10/10/2010  . COLPOSCOPY  03/01/10  . HERNIA REPAIR  November 2014  . LAPAROSCOPIC LYSIS OF ADHESIONS  02/28/2012   Procedure: LAPAROSCOPIC LYSIS OF ADHESIONS;  Surgeon: Osborne Oman, MD;  Location: [place];  Service: Gynecology;  Laterality: N/A;  . LAPAROSCOPY  02/28/2012   Procedure: LAPAROSCOPY OPERATIVE;  Surgeon: Osborne Oman, MD;  Location: [facility] [place] [facility];  Service: Gynecology;  Laterality: N/A;  . SALPINGOOPHORECTOMY  02/28/2012   Procedure: SALPINGO OOPHORECTOMY;  Surgeon: Osborne Oman, MD;  Location: [facility] [facility];  Service: Gynecology;  Laterality: Left;  . TOTAL HIP ARTHROPLASTY  04/19/15   Left  . TOTAL KNEE ARTHROPLASTY Left 08/26/2018   Dr. Claiborne Billings at [facility]    Prior to Admission medications   Medication Sig Start Date End Date Taking? Authorizing Provider  ADVAIR [facility] 115-21 MCG/ACT inhaler  03/22/16  Yes [provider]  azelastine (ASTELIN) 0.1 % nasal spray Place into both nostrils 2 (two) times daily. Use in each nostril as directed   Yes [provider]  azithromycin (ZITHROMAX) 250 MG tablet Take 1 tablet (250 mg total) by mouth daily. Take first 2 tablets  together, then 1 every day until finished. 07/02/20  Yes Sharion Balloon, NP  fexofenadine (ALLEGRA) 180 MG tablet Take 180 mg by mouth daily.   Yes [provider]  Foot Care Products (CORN CUSHIONS) PADS Use one pad daily. 01/09/20  Yes Pollak, Adriana M, PA-C  HYDROCORTISONE, TOPICAL, (TEXACORT) 2.5 % SOLN Apply 1 application topically daily. 3 days per week 04/26/20  Yes Ralene Bathe, MD  levothyroxine (SYNTHROID) 75 MCG tablet Take 1 tablet (75 mcg total) by mouth daily. 06/02/20  Yes Birdie Sons, MD  Multiple Vitamin (MULTIVITAMIN WITH MINERALS) TABS Take 1 tablet by mouth daily.   Yes [provider]  PROAIR HFA 108 (90 Base) MCG/ACT inhaler TAKE 2 PUFFS BY MOUTH EVERY 4 TO 6 HOURS AS NEEDED . MUST LAST 90 DAYS PER MD 08/10/15  Yes [provider]    Allergies as of 06/15/2020 - Review Complete 06/01/2020  Allergen Reaction Noted  . Dust mite extract  03/30/2015  . Other Other (See Comments) 03/31/2015  . Pollen extract  03/30/2015    Family History  Problem Relation Age of Onset  . Hypertension Mother   . Hypertension Father   . Diabetes Paternal Uncle     Social History   Socioeconomic History  . Marital status: Divorced    Spouse name: Not on file  . Number of children: Not on file  . Years of education: Not on file  .  Highest education level: Not on file  Occupational History  . Not on file  Tobacco Use  . Smoking status: Former Smoker    Quit date: 03/13/1987    Years since quitting: 33.3  . Smokeless tobacco: Never Used  Vaping Use  . Vaping Use: Never used  Substance and Sexual Activity  . Alcohol use: No  . Drug use: No  . Sexual activity: Not Currently  Other Topics Concern  . Not on file  Social History Narrative  . Not on file   Social Determinants of Health   Financial Resource Strain: Not on file  Food Insecurity: Not on file  Transportation Needs: Not on file  Physical Activity: Not on file  Stress: Not on file   Social Connections: Not on file  Intimate Partner Violence: Not on file    Review of Systems: See HPI, otherwise negative ROS  Physical Exam: BP (!) 143/91   Pulse 79   Temp (!) 97.3 F (36.3 C) (Temporal)   Resp 20   Ht 5\' 4"  (1.626 m)   Wt 89.4 kg   LMP 12/04/2011   SpO2 100%   BMI 33.81 kg/m  General:   Alert,  pleasant and cooperative in NAD Head:  Normocephalic and atraumatic. Neck:  Supple; no masses or thyromegaly. Lungs:  Clear throughout to auscultation.    Heart:  Regular rate and rhythm. Abdomen:  Soft, nontender and nondistended. Normal bowel sounds, without guarding, and without rebound.   Neurologic:  Alert and  oriented x4;  grossly normal neurologically.  Impression/Plan: Danielle Duarte is here for an colonoscopy to be performed for colon cancer screening  Risks, benefits, limitations, and alternatives regarding  colonoscopy have been reviewed with the patient.  Questions have been answered.  All parties agreeable.   Sherri Sear, MD  07/26/2020, 8:13 AM

## 2020-07-26 NOTE — Anesthesia Preprocedure Evaluation (Signed)
Anesthesia Evaluation  Patient identified by MRN, date of birth, ID band Patient awake    Reviewed: Allergy & Precautions, H&P , NPO status , Patient's Chart, lab work & pertinent test results, reviewed documented beta blocker date and time   History of Anesthesia Complications (+) PONV and history of anesthetic complications  Airway Mallampati: IV  TM Distance: >3 FB Neck ROM: full  Mouth opening: Limited Mouth Opening  Dental  (+) Dental Advidsory Given, Caps, Teeth Intact   Pulmonary neg shortness of breath, asthma , pneumonia, resolved, neg COPD, Recent URI  (Covid on 4/1), Resolved, former smoker,    Pulmonary exam normal breath sounds clear to auscultation       Cardiovascular Exercise Tolerance: Good negative cardio ROS Normal cardiovascular exam Rhythm:regular Rate:Normal     Neuro/Psych negative neurological ROS  negative psych ROS   GI/Hepatic negative GI ROS, Neg liver ROS,   Endo/Other  neg diabetesHypothyroidism   Renal/GU negative Renal ROS  negative genitourinary   Musculoskeletal   Abdominal   Peds  Hematology negative hematology ROS (+)   Anesthesia Other Findings Past Medical History: No date: Abnormal Pap smear No date: Arthritis     Comment:  right knee 03/24/2019: Dysplastic nevus     Comment:  spinal mid upper back, mod atypia  No date: Fibroids 06/12/2012: Normal cytology on  Pap smear with positive HPV test     Comment:  Repeat cotesting in one year  01/05/2014: Paroxysmal supraventricular tachycardia (HCC) No date: PONV (postoperative nausea and vomiting)   Reproductive/Obstetrics negative OB ROS                             Anesthesia Physical Anesthesia Plan  ASA: II  Anesthesia Plan: General   Post-op Pain Management:    Induction: Intravenous  PONV Risk Score and Plan: 4 or greater and TIVA and Propofol infusion  Airway Management Planned:  Natural Airway and Nasal Cannula  Additional Equipment:   Intra-op Plan:   Post-operative Plan:   Informed Consent: I have reviewed the patients History and Physical, chart, labs and discussed the procedure including the risks, benefits and alternatives for the proposed anesthesia with the patient or authorized representative who has indicated his/her understanding and acceptance.     Dental Advisory Given  Plan Discussed with: Anesthesiologist, CRNA and Surgeon  Anesthesia Plan Comments:         Anesthesia Quick Evaluation

## 2020-07-27 ENCOUNTER — Encounter: Payer: Self-pay | Admitting: Gastroenterology

## 2020-07-27 LAB — SURGICAL PATHOLOGY

## 2020-08-05 ENCOUNTER — Ambulatory Visit: Payer: BC Managed Care – PPO | Admitting: Family Medicine

## 2020-08-05 ENCOUNTER — Encounter: Payer: Self-pay | Admitting: Family Medicine

## 2020-08-05 ENCOUNTER — Other Ambulatory Visit: Payer: Self-pay

## 2020-08-05 VITALS — BP 137/80 | HR 66 | Wt 201.0 lb

## 2020-08-05 DIAGNOSIS — J4530 Mild persistent asthma, uncomplicated: Secondary | ICD-10-CM

## 2020-08-05 DIAGNOSIS — J129 Viral pneumonia, unspecified: Secondary | ICD-10-CM | POA: Diagnosis not present

## 2020-08-05 NOTE — Progress Notes (Signed)
Established patient visit   Patient: Danielle Duarte   DOB: 08-28-1960   60 y.o. Female  MRN: [id number] Visit Date: 08/05/2020  Today's healthcare provider: Vernie Murders, PA-C   Chief Complaint  Patient presents with   Hypertension   Subjective    Pneumonia This is a new (Pt diagnosed with pneumonia 07/02/2020) problem. The current episode started more than 1 month ago. Associated symptoms include malaise/fatigue. Pertinent negatives include no dyspnea on exertion, headaches, sneezing or sore throat. Her past medical history is significant for asthma.     Follow up for Elevated BP  Pt is coming in to report her BP has been running high at home.  -----------------------------------------------------------------------------------------  Patient Active Problem List   Diagnosis Date Noted   Encounter for screening colonoscopy    Family history of osteoporosis 05/11/2020   ASCUS with positive high risk HPV cervical pap on 04/20/2020 04/29/2020   History of PSVT (paroxysmal supraventricular tachycardia) 08/26/2018   Immune to varicella 04/09/2018   Vaginal dryness, menopausal 10/25/2017   Varicose veins of leg with swelling, 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/18/2017   Inclusion cyst of vulva 08/15/2016   Status post left hip replacement 04/19/2015   Allergic rhinitis 03/30/2015   Allergic asthma 03/30/2015   Direct hernia 03/30/2015   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/30/2015   HLD (hyperlipidemia) 03/30/2015   BMI 34.0-34.9,adult 03/30/2015   Beat, premature ventricular 03/30/2015   Nail deformity 03/30/2015   Degenerative arthritis of hip 01/14/2015   Arthritis of knee, degenerative 01/14/2015   Hypothyroid 12/07/2014   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/2011   Past Medical History:  Diagnosis Date   Abnormal Pap smear    Arthritis    right knee   Dysplastic nevus 03/24/2019   spinal mid upper back, mod atypia    Fibroids    Normal cytology on  Pap smear with  positive HPV test 06/12/2012   Repeat cotesting in one year    Paroxysmal supraventricular tachycardia ([place]) 01/05/2014   PONV (postoperative nausea and vomiting)    Past Surgical History:  Procedure Laterality Date   APPENDECTOMY     COLONOSCOPY  10/10/2010   COLONOSCOPY WITH PROPOFOL N/A 07/26/2020   Procedure: COLONOSCOPY WITH PROPOFOL;  Surgeon: Lin Landsman, MD;  Location: [facility];  Service: Gastroenterology;  Laterality: N/A;  Patient tested positive for COVID on 06/11/20.  She will email office a copy of these results.   COLPOSCOPY  03/01/10   HERNIA REPAIR  November 2014   LAPAROSCOPIC LYSIS OF ADHESIONS  02/28/2012   Procedure: LAPAROSCOPIC LYSIS OF ADHESIONS;  Surgeon: Osborne Oman, MD;  Location: [place] [facility];  Service: Gynecology;  Laterality: N/A;   LAPAROSCOPY  02/28/2012   Procedure: LAPAROSCOPY OPERATIVE;  Surgeon: Osborne Oman, MD;  Location: [place] [facility];  Service: Gynecology;  Laterality: N/A;   SALPINGOOPHORECTOMY  02/28/2012   Procedure: SALPINGO OOPHORECTOMY;  Surgeon: Osborne Oman, MD;  Location: [place] [facility];  Service: Gynecology;  Laterality: Left;   TOTAL HIP ARTHROPLASTY  04/19/15   Left   TOTAL KNEE ARTHROPLASTY Left 08/26/2018   Dr. Claiborne Billings at [place] History   Tobacco Use   Smoking status: Former Smoker    Quit date: 03/13/1987    Years since quitting: 33.4   Smokeless tobacco: Never Used  Vaping Use   Vaping Use: Never used  Substance Use Topics   Alcohol use: No   Drug use: No   Allergies  Allergen Reactions  Dust Mite Extract    Other Other (See Comments)    Dust mites   Pollen Extract     from trees and weeds     Medications: Outpatient Medications Prior to Visit  Medication Sig   ADVAIR HFA 115-21 MCG/ACT inhaler    azelastine (ASTELIN) 0.1 % nasal spray Place into both nostrils 2 (two) times daily. Use in each nostril as directed   fexofenadine (ALLEGRA) 180 MG tablet Take 180 mg by mouth daily.   Foot Care Products  (CORN CUSHIONS) PADS Use one pad daily.   HYDROCORTISONE, TOPICAL, (TEXACORT) 2.5 % SOLN Apply 1 application topically daily. 3 days per week   levothyroxine (SYNTHROID) 75 MCG tablet Take 1 tablet (75 mcg total) by mouth daily.   Multiple Vitamin (MULTIVITAMIN WITH MINERALS) TABS Take 1 tablet by mouth daily.   PROAIR HFA 108 (90 Base) MCG/ACT inhaler TAKE 2 PUFFS BY MOUTH EVERY 4 TO 6 HOURS AS NEEDED . MUST LAST 90 DAYS PER MD   No facility-administered medications prior to visit.    Review of Systems  Constitutional: Positive for malaise/fatigue.  HENT: Negative for sneezing and sore throat.   Respiratory: Negative.   Cardiovascular: Negative.  Negative for dyspnea on exertion.  Gastrointestinal: Negative.   Neurological: Negative for dizziness, light-headedness and headaches.       Objective    BP 137/80 (BP Location: Right Arm, Patient Position: Sitting, Cuff Size: Large)   Pulse 66   Wt 201 lb (91.2 kg)   LMP 12/04/2011   SpO2 98%   BMI 34.50 kg/m     Physical Exam Constitutional:      General: She is not in acute distress.    Appearance: She is well-developed.  HENT:     Head: Normocephalic and atraumatic.     Right Ear: Hearing normal.     Left Ear: Hearing normal.     Nose: Nose normal.  Eyes:     General: Lids are normal. No scleral icterus.       Right eye: No discharge.        Left eye: No discharge.     Conjunctiva/sclera: Conjunctivae normal.  Cardiovascular:     Rate and Rhythm: Normal rate and regular rhythm.     Heart sounds: Normal heart sounds.  Pulmonary:     Effort: Pulmonary effort is normal. No respiratory distress.     Breath sounds: Normal breath sounds.  Abdominal:     General: Bowel sounds are normal.     Palpations: Abdomen is soft.  Musculoskeletal:        General: Normal range of motion.  Skin:    Findings: No lesion or rash.  Neurological:     Mental Status: She is alert and oriented to person, place, and time.  Psychiatric:         Speech: Speech normal.        Behavior: Behavior normal.        Thought Content: Thought content normal.       No results found for any visits on 08/05/20.  Assessment & Plan     1. Viral pneumonia Diagnosed with COVID pneumonia on 07-02-20 per x-ray in the ER and treated with Z-pak. Had positive COVID test on 06-21-20. Still feeling a little fatigued and worried about any persistent scarring of lungs. Recheck CXR to assess progress. - DG Chest 2 View; Future  2. 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  Check CXR and may need pulmonology evaluation with PFT. - DG Chest 2 View; Future   No follow-ups on file.      I, Hesham Womac, PA-C, have reviewed all documentation for this visit. The documentation on 09/15/20 for the exam, diagnosis, procedures, and orders are all accurate and complete.    Vernie Murders, PA-C  [employer] [phone] (phone) [phone] (fax)  [place]

## 2020-08-11 ENCOUNTER — Ambulatory Visit
Admission: RE | Admit: 2020-08-11 | Discharge: 2020-08-11 | Disposition: A | Discharge status: Home / Self Care | Payer: BC Managed Care – PPO | Source: Ambulatory Visit | Attending: Family Medicine | Admitting: Family Medicine

## 2020-08-11 ENCOUNTER — Other Ambulatory Visit: Payer: Self-pay

## 2020-08-11 DIAGNOSIS — J4530 Mild persistent asthma, uncomplicated: Secondary | ICD-10-CM | POA: Diagnosis present

## 2020-08-11 DIAGNOSIS — J129 Viral pneumonia, unspecified: Secondary | ICD-10-CM | POA: Diagnosis present

## 2020-08-11 DIAGNOSIS — Z01419 Encounter for gynecological examination (general) (routine) without abnormal findings: Secondary | ICD-10-CM

## 2020-11-11 ENCOUNTER — Other Ambulatory Visit: Payer: Self-pay

## 2020-11-11 ENCOUNTER — Ambulatory Visit: Payer: BC Managed Care – PPO | Admitting: Dermatology

## 2020-11-11 DIAGNOSIS — R21 Rash and other nonspecific skin eruption: Secondary | ICD-10-CM

## 2020-11-11 NOTE — Patient Instructions (Addendum)
If you have any questions or concerns for your doctor, please call our main line at [phone] and press option 4 to reach your doctor's medical assistant. If no one answers, please leave a voicemail as directed and we will return your call as soon as possible. Messages left after 4 pm will be answered the following business day.   You may also send us a message via MyChart. We typically respond to MyChart messages within 1-2 business days.  For prescription refills, please ask your pharmacy to contact our office. Our fax number is [phone].  If you have an urgent issue when the clinic is closed that cannot wait until the next business day, you can page your doctor at the number below.    Please note that while we do our best to be available for urgent issues outside of office hours, we are not available 24/7.   If you have an urgent issue and are unable to reach us, you may choose to seek medical care at your doctor's office, retail clinic, urgent care center, or emergency room.  If you have a medical emergency, please immediately call 911 or go to the emergency department.  Pager Numbers  - Dr. Kowalski: [phone]  - Dr. Moye: [phone]  - Dr. Stewart: [phone]  In the event of inclement weather, please call our main line at [phone] for an update on the status of any delays or closures.  Dermatology Medication Tips: Please keep the boxes that topical medications come in in order to help keep track of the instructions about where and how to use these. Pharmacies typically print the medication instructions only on the boxes and not directly on the medication tubes.   If your medication is too expensive, please contact our office at [phone] option 4 or send us a message through MyChart.   We are unable to tell what your co-pay for medications will be in advance as this is different depending on your insurance coverage. However, we may be able to find a substitute  medication at lower cost or fill out paperwork to get insurance to cover a needed medication.   If a prior authorization is required to get your medication covered by your insurance company, please allow us 1-2 business days to complete this process.  Drug prices often vary depending on where the prescription is filled and some pharmacies may offer cheaper prices.  The website www.goodrx.com contains coupons for medications through different pharmacies. The prices here do not account for what the cost may be with help from insurance (it may be cheaper with your insurance), but the website can give you the price if you did not use any insurance.  - You can print the associated coupon and take it with your prescription to the pharmacy.  - You may also stop by our office during regular business hours and pick up a GoodRx coupon card.  - If you need your prescription sent electronically to a different pharmacy, notify our office through [facility] MyChart or by phone at [phone] option 4.   Wound Care Instructions  Cleanse wound gently with soap and water once a day then pat dry with clean gauze. Apply a thing coat of Petrolatum (petroleum jelly, "Vaseline") over the wound (unless you have an allergy to this). We recommend that you use a new, sterile tube of Vaseline. Do not pick or remove scabs. Do not remove the yellow or white "healing tissue" from the base of the wound.  Cover the   wound with fresh, clean, nonstick gauze and secure with paper tape. You may use Band-Aids in place of gauze and tape if the would is small enough, but would recommend trimming much of the tape off as there is often too much. Sometimes Band-Aids can irritate the skin.  You should call the office for your biopsy report after 1 week if you have not already been contacted.  If you experience any problems, such as abnormal amounts of bleeding, swelling, significant bruising, significant pain, or evidence of infection,  please call the office immediately.  FOR ADULT SURGERY PATIENTS: If you need something for pain relief you may take 1 extra strength Tylenol (acetaminophen) AND 2 Ibuprofen (200mg each) together every 4 hours as needed for pain. (do not take these if you are allergic to them or if you have a reason you should not take them.) Typically, you may only need pain medication for 1 to 3 days.    

## 2020-11-11 NOTE — Progress Notes (Signed)
   Follow-Up Visit   Subjective  Danielle Duarte is a 60 y.o. female who presents for the following: rash (Neck, 2 wks tried clotrimazole cr/Scalp, 5wks, itchy Tgel and Head and Shoulders) and dryness (Behind L ear, 6 wks).  The following portions of the chart were reviewed this encounter and updated as appropriate:   Tobacco  Allergies  Meds  Problems  Med Hx  Surg Hx  Fam Hx     Review of Systems:  No other skin or systemic complaints except as noted in HPI or Assessment and Plan.  Objective  Well appearing patient in no apparent distress; mood and affect are within normal limits.  A focused examination was performed including neck, scalp, ears. Relevant physical exam findings are noted in the Assessment and Plan.  post neck, post scalp/hairline, L post auricular Annular plaques  Assessment & Plan  Rash post neck, post scalp/hairline, L post auricular Chronic; persistent and progressive. Psoriasis vs Tinea vs GA (Granuloma Annulare)  KOH negative for fungal elements today   Skin / nail biopsy - post neck, post scalp/hairline, L post auricular Type of biopsy: tangential   Informed consent: discussed and consent obtained   Timeout: patient name, date of birth, surgical site, and procedure verified   Procedure prep:  Patient was prepped and draped in usual sterile fashion Prep type:  Isopropyl alcohol Anesthesia: the lesion was anesthetized in a standard fashion   Anesthetic:  1% lidocaine w/ epinephrine 1-100,000 buffered w/ 8.4% NaHCO3 Instrument used: flexible razor blade   Outcome: patient tolerated procedure well   Post-procedure details: sterile dressing applied and wound care instructions given   Dressing type: bandage and bacitracin    Specimen 1 - Surgical pathology Differential Diagnosis: D48.5 Psoriasis vs Tinea vs GA vs other  Check Margins: No Annular plaques KOH negative  Return for PRN pending bx.  I, Othelia Pulling, RMA, am acting as scribe for  Sarina Ser, MD . Documentation: I have reviewed the above documentation for accuracy and completeness, and I agree with the above.  Sarina Ser, MD

## 2020-11-18 ENCOUNTER — Encounter: Payer: Self-pay | Admitting: Dermatology

## 2020-11-24 ENCOUNTER — Telehealth: Payer: Self-pay

## 2020-11-24 DIAGNOSIS — R21 Rash and other nonspecific skin eruption: Secondary | ICD-10-CM

## 2020-11-24 MED ORDER — MOMETASONE FUROATE 0.1 % EX CREA: 1.0000 "application " | TOPICAL_CREAM | Freq: Two times a day (BID) | CUTANEOUS | 0 refills | Status: DC

## 2020-11-24 NOTE — Telephone Encounter (Signed)
-----   Message from Ralene Bathe, MD sent at 11/18/2020  5:22 PM EDT ----- Diagnosis Skin , post neck, post scalp/hairline, left post auricular LICHENOID SPONGIOTIC DERMATITIS, SEE DESCRIPTION  Dense inflammatory rash with features of a medication/drug reaction. Please ask patient if she has started any new medications (prescription or non-prescription)  prior to this rash. How is the rash doing?  Any recent history of tick bite? May send in Mometasone cream bid disp 30 g 0rf. Make appt for 3 weeks

## 2020-11-24 NOTE — Telephone Encounter (Signed)
Advised patient of results. She does not recall starting any new medications or any tick bites prior to rash. Her rash seems to be improving. Sent in Mometasone cream to [facility]

## 2020-11-26 ENCOUNTER — Encounter: Payer: Self-pay | Admitting: Dermatology

## 2020-11-30 ENCOUNTER — Telehealth: Payer: Self-pay

## 2020-11-30 ENCOUNTER — Ambulatory Visit
Admission: RE | Admit: 2020-11-30 | Discharge: 2020-11-30 | Disposition: A | Discharge status: Home / Self Care | Payer: BC Managed Care – PPO | Source: Ambulatory Visit | Attending: Emergency Medicine | Admitting: Emergency Medicine

## 2020-11-30 ENCOUNTER — Other Ambulatory Visit: Payer: Self-pay

## 2020-11-30 VITALS — BP 142/86 | HR 83 | Temp 98.6°F | Resp 20

## 2020-11-30 DIAGNOSIS — R319 Hematuria, unspecified: Secondary | ICD-10-CM

## 2020-11-30 LAB — POCT URINALYSIS DIP (MANUAL ENTRY)
Bilirubin, UA: NEGATIVE
Glucose, UA: NEGATIVE mg/dL
Ketones, POC UA: NEGATIVE mg/dL
Leukocytes, UA: NEGATIVE
Nitrite, UA: NEGATIVE
Protein Ur, POC: NEGATIVE mg/dL
Spec Grav, UA: 1.01 (ref 1.010–1.025)
Urobilinogen, UA: 0.2 E.U./dL
pH, UA: 5 (ref 5.0–8.0)

## 2020-11-30 NOTE — ED Provider Notes (Signed)
Roderic Palau    CSN: [id number] Arrival date & time: 11/30/20  1442      History   Chief Complaint Chief Complaint  Patient presents with   Hematuria     HPI Danielle Duarte is a 60 y.o. female.  Patient presents with blood noted when she wiped after urination today.  She denies fever, chills, abdominal pain, flank pain, dysuria, vaginal discharge, pelvic pain, or other symptoms.  Treatment at home with increased water intake.  Her medical history includes paroxysmal supraventricular tachycardia, arthritis, hyperlipidemia, asthma, allergic rhinitis, hypothyroidism.  The history is provided by the patient and medical records.   Past Medical History:  Diagnosis Date   Abnormal Pap smear    Arthritis    right knee   Dysplastic nevus 03/24/2019   spinal mid upper back, mod atypia    Fibroids    Normal cytology on  Pap smear with positive HPV test 06/12/2012   Repeat cotesting in one year    Paroxysmal supraventricular tachycardia ([place]) 01/05/2014   PONV (postoperative nausea and vomiting)     Patient Active Problem List   Diagnosis Date Noted   Encounter for screening colonoscopy    Family history of osteoporosis 05/11/2020   ASCUS with positive high risk HPV cervical pap on 04/20/2020 04/29/2020   History of PSVT (paroxysmal supraventricular tachycardia) 08/26/2018   Immune to varicella 04/09/2018   Vaginal dryness, menopausal 10/25/2017   Varicose veins of leg with swelling, 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/18/2017   Inclusion cyst of vulva 08/15/2016   Status post left hip replacement 04/19/2015   Allergic rhinitis 03/30/2015   Allergic asthma 03/30/2015   Direct hernia 03/30/2015   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/30/2015   HLD (hyperlipidemia) 03/30/2015   BMI 34.0-34.9,adult 03/30/2015   Beat, premature ventricular 03/30/2015   Nail deformity 03/30/2015   Degenerative arthritis of hip 01/14/2015   Arthritis of knee, degenerative 01/14/2015   Hypothyroid 12/07/2014   Left Ovarian  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  She will email office a copy of these results.   COLPOSCOPY  03/01/10   HERNIA REPAIR  November 2014   LAPAROSCOPIC LYSIS OF ADHESIONS  02/28/2012   Procedure: LAPAROSCOPIC LYSIS OF ADHESIONS;  Surgeon: Osborne Oman, MD;  Location: [facility];  Service: Gynecology;  Laterality: N/A;   LAPAROSCOPY  02/28/2012   Procedure: LAPAROSCOPY OPERATIVE;  Surgeon: Osborne Oman, MD;  Location: [facility];  Service: Gynecology;  Laterality: N/A;   SALPINGOOPHORECTOMY  02/28/2012   Procedure: SALPINGO OOPHORECTOMY;  Surgeon: Osborne Oman, MD;  Location: [facility];  Service: Gynecology;  Laterality: Left;   TOTAL HIP ARTHROPLASTY  04/19/15   Left   TOTAL KNEE ARTHROPLASTY Left 08/26/2018   Dr. Claiborne Billings at [facility]    OB History     Gravida  2   Para  2   Term      Preterm      AB      Living  2      SAB      IAB      Ectopic      Multiple      Live Births  2  Home Medications    Prior to Admission medications   Medication Sig Start Date End Date Taking? Authorizing Provider  ADVAIR [facility] 481-85 MCG/ACT inhaler  03/22/16   [provider]  azelastine (ASTELIN) 0.1 % nasal spray Place into both nostrils 2 (two) times daily. Use in each nostril as directed    [provider]  fexofenadine (ALLEGRA) 180 MG tablet Take 180 mg by mouth daily.    [provider]  Foot Care Products (CORN CUSHIONS) PADS Use one pad daily. 01/09/20   Trinna Post, PA-C  HYDROCORTISONE, TOPICAL, (TEXACORT) 2.5 % SOLN Apply 1 application topically daily. 3 days per week 04/26/20    Ralene Bathe, MD  levothyroxine (SYNTHROID) 75 MCG tablet Take 1 tablet (75 mcg total) by mouth daily. 06/02/20   Birdie Sons, MD  mometasone (ELOCON) 0.1 % cream Apply 1 application topically in the morning and at bedtime. 11/24/20   Ralene Bathe, MD  Multiple Vitamin (MULTIVITAMIN WITH MINERALS) TABS Take 1 tablet by mouth daily.    [provider]  PROAIR HFA 108 (90 Base) MCG/ACT inhaler TAKE 2 PUFFS BY MOUTH EVERY 4 TO 6 HOURS AS NEEDED . MUST LAST 90 DAYS PER MD 08/10/15   [provider]    Family History Family History  Problem Relation Age of Onset   Hypertension Mother    Hypertension Father    Diabetes Paternal Uncle     Social History Social History   Tobacco Use   Smoking status: Former    Types: Cigarettes    Quit date: 03/13/1987    Years since quitting: 33.7   Smokeless tobacco: Never  Vaping Use   Vaping Use: Never used  Substance Use Topics   Alcohol use: No   Drug use: No     Allergies   Dust mite extract, Other, and Pollen extract   Review of Systems Review of Systems  Constitutional:  Negative for chills and fever.  Respiratory:  Negative for cough and shortness of breath.   Cardiovascular:  Negative for chest pain and palpitations.  Gastrointestinal:  Negative for abdominal pain and vomiting.  Genitourinary:  Positive for hematuria. Negative for dysuria, flank pain, frequency, pelvic pain and vaginal discharge.  Musculoskeletal:  Negative for arthralgias and back pain.  Skin:  Negative for color change and rash.  All other systems reviewed and are negative.   Physical Exam Triage Vital Signs ED Triage Vitals  Enc Vitals Group     BP      Pulse      Resp      Temp      Temp src      SpO2      Weight      Height      Head Circumference      Peak Flow      Pain Score      Pain Loc      Pain Edu?      Excl. in [place]?    No data found.  Updated Vital Signs BP (!) 142/86   Pulse 83   Temp 98.6 F (37  C) (Oral)   Resp 20   LMP 12/04/2011   SpO2 100%   Visual Acuity Right Eye Distance:   Left Eye Distance:   Bilateral Distance:    Right Eye Near:   Left Eye Near:    Bilateral Near:     Physical Exam Vitals and nursing note reviewed.  Constitutional:  General: She is not in acute distress.    Appearance: She is well-developed. She is not ill-appearing.  HENT:     Head: Normocephalic and atraumatic.     Mouth/Throat:     Mouth: Mucous membranes are moist.  Eyes:     Conjunctiva/sclera: Conjunctivae normal.  Cardiovascular:     Rate and Rhythm: Normal rate and regular rhythm.     Heart sounds: Normal heart sounds.  Pulmonary:     Effort: Pulmonary effort is normal. No respiratory distress.     Breath sounds: Normal breath sounds.  Abdominal:     General: Bowel sounds are normal.     Palpations: Abdomen is soft.     Tenderness: There is no abdominal tenderness. There is no right CVA tenderness, left CVA tenderness, guarding or rebound.  Musculoskeletal:     Cervical back: Neck supple.  Skin:    General: Skin is warm and dry.  Neurological:     General: No focal deficit present.     Mental Status: She is alert and oriented to person, place, and time.     Gait: Gait normal.  Psychiatric:        Mood and Affect: Mood normal.        Behavior: Behavior normal.     UC Treatments / Results  Labs (all labs ordered are listed, but only abnormal results are displayed) Labs Reviewed  POCT URINALYSIS DIP (MANUAL ENTRY) - Abnormal; Notable for the following components:      Result Value   Blood, UA moderate (*)    All other components within normal limits    EKG   Radiology No results found.  Procedures Procedures (including critical care time)  Medications Ordered in UC Medications - No data to display  Initial Impression / Assessment and Plan / UC Course  I have reviewed the triage vital signs and the nursing notes.  Pertinent labs & imaging results  that were available during my care of the patient were reviewed by me and considered in my medical decision making (see chart for details).   Hematuria.  Urine normal other than moderate blood.  Instructed patient to follow-up with her PCP in 1 to 2 weeks for recheck of her urine.  Discussed possible causes of hematuria and education provided.  Discussed that she should consider seeing a urologist if the hematuria persists.  Patient agrees to plan of care.   Final Clinical Impressions(s) / UC Diagnoses   Final diagnoses:  Hematuria, unspecified type     Discharge Instructions      Your urine does not show signs of infection but is positive for blood.  Have this rechecked by your primary care provider in 1 to 2 weeks.  If the blood in your urine continues, consider going to see a urologist.         ED Prescriptions   None    PDMP not reviewed this encounter.   Sharion Balloon, NP 11/30/20 1515

## 2020-11-30 NOTE — ED Triage Notes (Signed)
Pt here after wiping and seeing a scant amount of blood on paper towel. No pain, no discharge, no hemorrhoids.

## 2020-11-30 NOTE — Telephone Encounter (Signed)
Patient contacted office with concerns of dysuria and hematuria for the past 24 hours. She denies fever nausea or vomiting she requested appointment to see her PCP for possible UTI, patient advised there is no available appointments and would need to seek treatment at urgent care, patient is wanting to know if doctor can fit her in to be seen  since she is leaving out of town this weekend? I reviewed of [facility] schedule and she is booked for the week. Please advise. KW

## 2020-11-30 NOTE — Telephone Encounter (Signed)
Apt scheduled pt advised.   Thanks,   -Mickel Baas

## 2020-11-30 NOTE — Discharge Instructions (Addendum)
Your urine does not show signs of infection but is positive for blood.  Have this rechecked by your primary care provider in 1 to 2 weeks.  If the blood in your urine continues, consider going to see a urologist.

## 2020-11-30 NOTE — Telephone Encounter (Signed)
Copied from [place] [id number]. Topic: General - Other >> Nov 30, 2020  4:00 PM Leward Quan A wrote: Reason for CRM: Patient called in to inform Dr Caryn Section that she went to Urgent Care and they advised her to be seen within a week by her PCP. Nothing available so asking if the nurse can call patient to schedule an earlier appointment than November. Per patient she was found to have blood in her urine. Please call Ph# [phone]

## 2020-11-30 NOTE — Telephone Encounter (Signed)
She can be worked in at [facility]

## 2020-12-01 ENCOUNTER — Ambulatory Visit: Payer: BC Managed Care – PPO | Admitting: Family Medicine

## 2020-12-01 NOTE — Telephone Encounter (Signed)
Patient needs appointment for follow up hematuria in 1-2 weeks per urgent care note. Please schedule.

## 2020-12-01 NOTE — Telephone Encounter (Signed)
Appt scheduled

## 2020-12-14 ENCOUNTER — Encounter: Payer: Self-pay | Admitting: Family Medicine

## 2020-12-14 ENCOUNTER — Ambulatory Visit: Payer: BC Managed Care – PPO | Admitting: Family Medicine

## 2020-12-14 ENCOUNTER — Other Ambulatory Visit: Payer: Self-pay

## 2020-12-14 VITALS — BP 137/73 | HR 70 | Temp 98.0°F | Wt 207.0 lb

## 2020-12-14 DIAGNOSIS — R310 Gross hematuria: Secondary | ICD-10-CM | POA: Diagnosis not present

## 2020-12-14 LAB — POCT URINALYSIS DIPSTICK
Bilirubin, UA: NEGATIVE
Blood, UA: NEGATIVE
Glucose, UA: NEGATIVE
Ketones, UA: NEGATIVE
Leukocytes, UA: NEGATIVE
Nitrite, UA: NEGATIVE
Protein, UA: NEGATIVE
Spec Grav, UA: 1.025 (ref 1.010–1.025)
Urobilinogen, UA: 0.2 E.U./dL
pH, UA: 6 (ref 5.0–8.0)

## 2020-12-14 NOTE — Progress Notes (Signed)
Established patient visit   Patient: Danielle Duarte   DOB: [birth year]   60 y.o. Female  MRN: [id number] Visit Date: 12/14/2020  Today's healthcare provider: Lelon Huh, MD   Chief Complaint  Patient presents with   Hematuria    Subjective    Hematuria The current episode started 1 to 4 weeks ago. The problem has been resolved since onset. She describes the hematuria as gross hematuria. Irritative symptoms do not include frequency or urgency. Pertinent negatives include no dysuria or flank pain.   Follow up for Hematuria  The patient was last seen for this 2 weeks ago.  She feels that condition is  resolved . She is not having side effects.   -----------------------------------------------------------------------------------------     Medications: Outpatient Medications Prior to Visit  Medication Sig   ADVAIR HFA 115-21 MCG/ACT inhaler    azelastine (ASTELIN) 0.1 % nasal spray Place into both nostrils 2 (two) times daily. Use in each nostril as directed   HYDROCORTISONE, TOPICAL, (TEXACORT) 2.5 % SOLN Apply 1 application topically daily. 3 days per week   levocetirizine (XYZAL) 5 MG tablet Take 5 mg by mouth every evening.   levothyroxine (SYNTHROID) 75 MCG tablet Take 1 tablet (75 mcg total) by mouth daily.   mometasone (ELOCON) 0.1 % cream Apply 1 application topically in the morning and at bedtime.   Multiple Vitamin (MULTIVITAMIN WITH MINERALS) TABS Take 1 tablet by mouth daily.   PROAIR HFA 108 (90 Base) MCG/ACT inhaler TAKE 2 PUFFS BY MOUTH EVERY 4 TO 6 HOURS AS NEEDED . MUST LAST 90 DAYS PER MD   fexofenadine (ALLEGRA) 180 MG tablet Take 180 mg by mouth daily.   Foot Care Products (CORN CUSHIONS) PADS Use one pad daily.   No facility-administered medications prior to visit.    Review of Systems  Constitutional: Negative.   Gastrointestinal: Negative.   Genitourinary:  Positive for hematuria. Negative for dysuria, flank pain, frequency, urgency, vaginal  bleeding, vaginal discharge and vaginal pain.      Objective    BP 137/73 (BP Location: Right Arm, Patient Position: Sitting, Cuff Size: Large)   Pulse 70   Temp 98 F (36.7 C) (Oral)   Wt 207 lb (93.9 kg)   LMP 12/04/2011   SpO2 97%   BMI 35.53 kg/m    Physical Exam  General appearance: Mildly obese female, cooperative and in no acute distress Head: Normocephalic, without obvious abnormality, atraumatic Respiratory: Respirations even and unlabored, normal respiratory rate Extremities: All extremities are intact.  Skin: Skin color, texture, turgor normal. No rashes seen  Psych: Appropriate mood and affect. Neurologic: Mental status: Alert, oriented to person, place, and time, thought content appropriate.   Results for orders placed or performed in visit on 12/14/20  POCT urinalysis dipstick  Result Value Ref Range   Color, UA     Clarity, UA     Glucose, UA Negative Negative   Bilirubin, UA Negative    Ketones, UA Negative    Spec Grav, UA 1.025 1.010 - 1.025   Blood, UA Negative    pH, UA 6.0 5.0 - 8.0   Protein, UA Negative Negative   Urobilinogen, UA 0.2 0.2 or 1.0 E.U./dL   Nitrite, UA Negative    Leukocytes, UA Negative Negative   Appearance     Odor      Assessment & Plan     1. Gross hematuria Has now completely resolved. No urinary sx. Suspect soft tissue injury that cause  small amount bleeding when wiping that lasted a few days. If bleeding recurs she is to call for urology referral.        The entirety of the information documented in the History of Present Illness, Review of Systems and Physical Exam were personally obtained by me. Portions of this information were initially documented by the CMA and reviewed by me for thoroughness and accuracy.     Lelon Huh, MD  [facility] [phone] (phone) [phone] (fax)  [place]

## 2020-12-23 ENCOUNTER — Ambulatory Visit: Payer: BC Managed Care – PPO | Admitting: Dermatology

## 2020-12-23 ENCOUNTER — Encounter: Payer: Self-pay | Admitting: Dermatology

## 2020-12-23 ENCOUNTER — Other Ambulatory Visit: Payer: Self-pay

## 2020-12-23 DIAGNOSIS — L309 Dermatitis, unspecified: Secondary | ICD-10-CM

## 2020-12-23 MED ORDER — DOXYCYCLINE HYCLATE 100 MG PO TABS
100.0000 mg | ORAL_TABLET | Freq: Two times a day (BID) | ORAL | 0 refills | Status: AC
Start: 2020-12-23 — End: 2021-01-22

## 2020-12-23 NOTE — Patient Instructions (Signed)

## 2020-12-23 NOTE — Progress Notes (Signed)
   Follow-Up Visit   Subjective  Courney A Huck is a 60 y.o. female who presents for the following: Dermatitis (6 weeks f/u on scalp, biopsy proven dermatitis ).  The following portions of the chart were reviewed this encounter and updated as appropriate:   Tobacco  Allergies  Meds  Problems  Med Hx  Surg Hx  Fam Hx     Review of Systems:  No other skin or systemic complaints except as noted in HPI or Assessment and Plan.  Objective  Well appearing patient in no apparent distress; mood and affect are within normal limits.  A focused examination was performed including face,scalp,neck. Relevant physical exam findings are noted in the Assessment and Plan.  post neck, post scalp/hairline, L post auricular Clear skin today    Assessment & Plan  Dermatitis -rash is clear today post neck, post scalp/hairline, L post auricular  Biopsy proven Medication reaction vs Lyme's disease   Patient does not recall starting any new medications or any tick bites prior to rash.   Due to possibility Lyme's diseases will start Doxycyline prophylactic. Doxycycline 100 mg take 1 tablet po bid with food x 1 month   Related Medications doxycycline (VIBRA-TABS) 100 MG tablet Take 1 tablet (100 mg total) by mouth 2 (two) times daily.  Return in about 4 months (around 04/25/2021) for TBSE in Feb and recheck scalp .  IMarye Round, CMA, am acting as scribe for Sarina Ser, MD .  Documentation: I have reviewed the above documentation for accuracy and completeness, and I agree with the above.  Sarina Ser, MD

## 2021-01-19 ENCOUNTER — Other Ambulatory Visit: Payer: Self-pay | Admitting: Dermatology

## 2021-01-19 DIAGNOSIS — L309 Dermatitis, unspecified: Secondary | ICD-10-CM

## 2021-03-08 ENCOUNTER — Encounter: Payer: Self-pay | Admitting: Radiology

## 2021-03-22 ENCOUNTER — Other Ambulatory Visit: Payer: Self-pay

## 2021-03-22 ENCOUNTER — Ambulatory Visit (INDEPENDENT_AMBULATORY_CARE_PROVIDER_SITE_OTHER): Payer: BC Managed Care – PPO

## 2021-03-22 ENCOUNTER — Ambulatory Visit
Admission: RE | Admit: 2021-03-22 | Discharge: 2021-03-22 | Disposition: A | Discharge status: Home / Self Care | Payer: BC Managed Care – PPO | Source: Ambulatory Visit | Attending: Emergency Medicine | Admitting: Emergency Medicine

## 2021-03-22 VITALS — BP 162/98 | HR 98 | Temp 98.2°F | Resp 18

## 2021-03-22 DIAGNOSIS — J0100 Acute maxillary sinusitis, unspecified: Secondary | ICD-10-CM

## 2021-03-22 DIAGNOSIS — R058 Other specified cough: Secondary | ICD-10-CM

## 2021-03-22 DIAGNOSIS — R059 Cough, unspecified: Secondary | ICD-10-CM | POA: Diagnosis not present

## 2021-03-22 MED ORDER — AZITHROMYCIN 250 MG PO TABS: 250.0000 mg | ORAL_TABLET | Freq: Every day | ORAL | 0 refills | Status: DC

## 2021-03-22 NOTE — ED Provider Notes (Signed)
Roderic Palau    CSN: [id number] Arrival date & time: 03/22/21  1844      History   Chief Complaint Chief Complaint  Patient presents with   Cough   Nasal Congestion   Sore Throat   Generalized Body Aches    HPI Danielle Duarte is a 61 y.o. female.  Patient presents with 1 week history of cough and congestion.  Her cough became productive of yellow sputum for the last couple of days.  She also has headache and sore throat.  Treatment at home with Tylenol; last dose taken this morning.  She denies fever, rash, wheezing, shortness of breath, vomiting, diarrhea, or other symptoms.  Her medical history includes pneumonia, allergic rhinitis, and asthma.  She has not used her albuterol inhaler today.   The history is provided by the patient and medical records.   Past Medical History:  Diagnosis Date   Abnormal Pap smear    Arthritis    right knee   Dysplastic nevus 03/24/2019   spinal mid upper back, mod atypia    Fibroids    Normal cytology on  Pap smear with positive HPV test 06/12/2012   Repeat cotesting in one year    Paroxysmal supraventricular tachycardia ([place]) 01/05/2014   PONV (postoperative nausea and vomiting)     Patient Active Problem List   Diagnosis Date Noted   Family history of osteoporosis 05/11/2020   ASCUS with positive high risk HPV cervical pap on 04/20/2020 04/29/2020   History of PSVT (paroxysmal supraventricular tachycardia) 08/26/2018   Immune to varicella 04/09/2018   Vaginal dryness, menopausal 10/25/2017   Varicose veins of leg with swelling, 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/18/2017   Inclusion cyst of vulva 08/15/2016   Status post left hip replacement 04/19/2015   Allergic rhinitis 03/30/2015   Allergic asthma 03/30/2015   Direct hernia 03/30/2015   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/30/2015   HLD (hyperlipidemia) 03/30/2015   BMI 34.0-34.9,adult 03/30/2015   Beat, premature ventricular 03/30/2015   Nail deformity 03/30/2015   Degenerative arthritis of hip 01/14/2015    Arthritis of knee, degenerative 01/14/2015   Hypothyroid 12/07/2014   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  She will email office a copy of these results.   COLPOSCOPY  03/01/10   HERNIA REPAIR  November 2014   LAPAROSCOPIC LYSIS OF ADHESIONS  02/28/2012   Procedure: LAPAROSCOPIC LYSIS OF ADHESIONS;  Surgeon: Osborne Oman, MD;  Location: [facility];  Service: Gynecology;  Laterality: N/A;   LAPAROSCOPY  02/28/2012   Procedure: LAPAROSCOPY OPERATIVE;  Surgeon: Osborne Oman, MD;  Location: [facility];  Service: Gynecology;  Laterality: N/A;   SALPINGOOPHORECTOMY  02/28/2012   Procedure: SALPINGO OOPHORECTOMY;  Surgeon: Osborne Oman, MD;  Location: [facility];  Service: Gynecology;  Laterality: Left;   TOTAL HIP ARTHROPLASTY  04/19/15   Left   TOTAL KNEE ARTHROPLASTY Left 08/26/2018   Dr. Claiborne Billings at [facility]    OB History     Gravida  2   Para  2   Term      Preterm      AB      Living  2  SAB      IAB      Ectopic      Multiple      Live Births  2            Home Medications    Prior to Admission medications   Medication Sig Start Date End Date Taking? Authorizing Provider  azithromycin (ZITHROMAX) 250 MG tablet Take 1 tablet (250 mg total) by mouth daily. Take first 2 tablets together, then 1 every day until finished. 03/22/21  Yes Sharion Balloon, NP  ADVAIR [facility] 115-21 MCG/ACT inhaler  03/22/16   [provider]  azelastine (ASTELIN) 0.1 % nasal spray Place into both nostrils 2 (two) times daily. Use in each nostril as directed    [provider]  [place] (CORN  CUSHIONS) PADS Use one pad daily. 01/09/20   Trinna Post, PA-C  HYDROCORTISONE, TOPICAL, (TEXACORT) 2.5 % SOLN Apply 1 application topically daily. 3 days per week 04/26/20   Ralene Bathe, MD  levocetirizine (XYZAL) 5 MG tablet Take 5 mg by mouth every evening.    [provider]  levothyroxine (SYNTHROID) 75 MCG tablet Take 1 tablet (75 mcg total) by mouth daily. 06/02/20   Birdie Sons, MD  mometasone (ELOCON) 0.1 % cream Apply 1 application topically in the morning and at bedtime. 11/24/20   Ralene Bathe, MD  Multiple Vitamin (MULTIVITAMIN WITH MINERALS) TABS Take 1 tablet by mouth daily.    [provider]  PROAIR HFA 108 (90 Base) MCG/ACT inhaler TAKE 2 PUFFS BY MOUTH EVERY 4 TO 6 HOURS AS NEEDED . MUST LAST 90 DAYS PER MD 08/10/15   [provider]    Family History Family History  Problem Relation Age of Onset   Hypertension Mother    Hypertension Father    Diabetes Paternal Uncle     Social History Social History   Tobacco Use   Smoking status: Former    Types: Cigarettes    Quit date: 03/13/1987    Years since quitting: 34.0   Smokeless tobacco: Never  Vaping Use   Vaping Use: Never used  Substance Use Topics   Alcohol use: No   Drug use: No     Allergies   Dust mite extract, Other, and Pollen extract   Review of Systems Review of Systems  Constitutional:  Negative for chills and fever.  HENT:  Positive for congestion and sore throat. Negative for ear pain.   Eyes:  Negative for visual disturbance.  Respiratory:  Positive for cough. Negative for shortness of breath and wheezing.   Cardiovascular:  Negative for chest pain and palpitations.  Gastrointestinal:  Negative for diarrhea and vomiting.  Skin:  Negative for color change and rash.  Neurological:  Positive for headaches.  All other systems reviewed and are negative.   Physical Exam Triage Vital Signs ED Triage Vitals  Enc Vitals Group     BP      Pulse       Resp      Temp      Temp src      SpO2      Weight      Height      Head Circumference      Peak Flow      Pain Score      Pain Loc      Pain Edu?      Excl. in [place]?    No data found.  Updated  Vital Signs BP (!) 162/98 (BP Location: Left Arm)    Pulse 98    Temp 98.2 F (36.8 C)    Resp 18    LMP 12/04/2011    SpO2 95%   Visual Acuity Right Eye Distance:   Left Eye Distance:   Bilateral Distance:    Right Eye Near:   Left Eye Near:    Bilateral Near:     Physical Exam Vitals and nursing note reviewed.  Constitutional:      General: She is not in acute distress.    Appearance: She is well-developed. She is obese.  HENT:     Right Ear: Tympanic membrane normal.     Left Ear: Tympanic membrane normal.     Nose: Congestion present.     Mouth/Throat:     Mouth: Mucous membranes are moist.     Pharynx: Oropharynx is clear.  Cardiovascular:     Rate and Rhythm: Normal rate and regular rhythm.     Heart sounds: Normal heart sounds.  Pulmonary:     Effort: Pulmonary effort is normal. No respiratory distress.     Breath sounds: Wheezing and rhonchi present.     Comments: Scattered rhonchi which clear with cough.  Few faint expiratory wheezes.   Musculoskeletal:     Cervical back: Neck supple.  Skin:    General: Skin is warm and dry.  Neurological:     Mental Status: She is alert.  Psychiatric:        Mood and Affect: Mood normal.        Behavior: Behavior normal.     UC Treatments / Results  Labs (all labs ordered are listed, but only abnormal results are displayed) Labs Reviewed - No data to display  EKG   Radiology DG Chest 2 View  Result Date: 03/22/2021 CLINICAL DATA:  Productive cough. EXAM: CHEST - 2 VIEW COMPARISON:  Chest x-ray 08/11/2020. FINDINGS: The heart size and mediastinal contours are within normal limits. Both lungs are clear. The visualized skeletal structures are unremarkable. IMPRESSION: No active cardiopulmonary disease.  Electronically Signed   By: Ronney Asters M.D.   On: 03/22/2021 19:18    Procedures Procedures (including critical care time)  Medications Ordered in UC Medications - No data to display  Initial Impression / Assessment and Plan / UC Course  I have reviewed the triage vital signs and the nursing notes.  Pertinent labs & imaging results that were available during my care of the patient were reviewed by me and considered in my medical decision making (see chart for details).   Productive cough, acute sinusitis.  Chest xray negative.  Patient declines treatment with prednisone because it causes sleeplessness.  Treating with Zithromax and continued use of albuterol inhaler.  Instructed patient to follow-up with her PCP if her symptoms are not improving.  ED precautions discussed.  Patient agrees to plan of care.  Final Clinical Impressions(s) / UC Diagnoses   Final diagnoses:  Productive cough  Acute non-recurrent maxillary sinusitis     Discharge Instructions      Take the Zithromax as directed.  Follow up with your primary care provider if your symptoms are not improving.         ED Prescriptions     Medication Sig Dispense Auth. Provider   azithromycin (ZITHROMAX) 250 MG tablet Take 1 tablet (250 mg total) by mouth daily. Take first 2 tablets together, then 1 every day until finished. 6 tablet Sharion Balloon, NP  PDMP not reviewed this encounter.   Sharion Balloon, NP 03/22/21 1927

## 2021-03-22 NOTE — Discharge Instructions (Addendum)
Take the Zithromax as directed.  Follow up with your primary care provider if your symptoms are not improving.    

## 2021-03-22 NOTE — ED Triage Notes (Signed)
Pt here with cough, congestion, headache, and sore throat for just over a week.

## 2021-04-28 ENCOUNTER — Ambulatory Visit: Payer: BC Managed Care – PPO | Admitting: Dermatology

## 2021-04-28 ENCOUNTER — Other Ambulatory Visit: Payer: Self-pay

## 2021-04-28 DIAGNOSIS — L814 Other melanin hyperpigmentation: Secondary | ICD-10-CM

## 2021-04-28 DIAGNOSIS — A6920 Lyme disease, unspecified: Secondary | ICD-10-CM

## 2021-04-28 DIAGNOSIS — Z1283 Encounter for screening for malignant neoplasm of skin: Secondary | ICD-10-CM | POA: Diagnosis not present

## 2021-04-28 DIAGNOSIS — L578 Other skin changes due to chronic exposure to nonionizing radiation: Secondary | ICD-10-CM | POA: Diagnosis not present

## 2021-04-28 DIAGNOSIS — L2089 Other atopic dermatitis: Secondary | ICD-10-CM | POA: Diagnosis not present

## 2021-04-28 DIAGNOSIS — D229 Melanocytic nevi, unspecified: Secondary | ICD-10-CM

## 2021-04-28 DIAGNOSIS — Z86018 Personal history of other benign neoplasm: Secondary | ICD-10-CM

## 2021-04-28 DIAGNOSIS — L821 Other seborrheic keratosis: Secondary | ICD-10-CM

## 2021-04-28 DIAGNOSIS — D1800 Hemangioma unspecified site: Secondary | ICD-10-CM

## 2021-04-28 DIAGNOSIS — L820 Inflamed seborrheic keratosis: Secondary | ICD-10-CM

## 2021-04-28 MED ORDER — MOMETASONE FUROATE 0.1 % EX CREA: TOPICAL_CREAM | CUTANEOUS | 2 refills | Status: DC

## 2021-04-28 NOTE — Patient Instructions (Addendum)
Gentle Skin Care Guide  1. Bathe no more than once a day.  2. Avoid bathing in hot water  3. Use a mild soap like Dove, Vanicream, Cetaphil, CeraVe. Can use Lever 2000 or Cetaphil antibacterial soap  4. Use soap only where you need it. On most days, use it under your arms, between your legs, and on your feet. Let the water rinse other areas unless visibly dirty.  5. When you get out of the bath/shower, use a towel to gently blot your skin dry, don't rub it.  6. While your skin is still a little damp, apply a moisturizing  CeraVe.   7. Reapply moisturizer any time you start to itch or feel dry.  8. Sometimes using free and clear laundry detergents can be helpful. Fabric softener sheets should be avoided. Downy Free & Gentle liquid, or any liquid fabric softener that is free of dyes and perfumes, it acceptable to use  9. If your doctor has given you prescription creams you may apply moisturizers over them      If You Need Anything After Your Visit  If you have any questions or concerns for your doctor, please call our main line at [phone] and press option 4 to reach your doctor's medical assistant. If no one answers, please leave a voicemail as directed and we will return your call as soon as possible. Messages left after 4 pm will be answered the following business day.   You may also send [place] a message via [place]. We typically respond to MyChart messages within 1-2 business days.  For prescription refills, please ask your pharmacy to contact our office. Our fax number is [phone].  If you have an urgent issue when the clinic is closed that cannot wait until the next business day, you can page your doctor at the number below.    Please note that while we do our best to be available for urgent issues outside of office hours, we are not available 24/7.   If you have an urgent issue and are unable to reach [place], you may choose to seek medical care at your doctor's office, retail  clinic, urgent care center, or emergency room.  If you have a medical emergency, please immediately call 911 or go to the emergency department.  Pager Numbers  - Dr. Nehemiah Massed: [phone]  - Dr. Laurence Ferrari: [phone]  - Dr. Nicole Kindred: [phone]  In the event of inclement weather, please call our main line at [phone] for an update on the status of any delays or closures.  Dermatology Medication Tips: Please keep the boxes that topical medications come in in order to help keep track of the instructions about where and how to use these. Pharmacies typically print the medication instructions only on the boxes and not directly on the medication tubes.   If your medication is too expensive, please contact our office at [phone] option 4 or send [place] a message through [place].   We are unable to tell what your co-pay for medications will be in advance as this is different depending on your insurance coverage. However, we may be able to find a substitute medication at lower cost or fill out paperwork to get insurance to cover a needed medication.   If a prior authorization is required to get your medication covered by your insurance company, please allow [place] 1-2 business days to complete this process.  Drug prices often vary depending on where the prescription is filled and some pharmacies may offer cheaper  prices.  The website www.goodrx.com contains coupons for medications through different pharmacies. The prices here do not account for what the cost may be with help from insurance (it may be cheaper with your insurance), but the website can give you the price if you did not use any insurance.  - You can print the associated coupon and take it with your prescription to the pharmacy.  - You may also stop by our office during regular business hours and pick up a GoodRx coupon card.  - If you need your prescription sent electronically to a different pharmacy, notify our office through [facility] or by phone at [phone] option 4.     Si Usted Necesita Algo Despus de Su Visita  Tambin puede enviarnos un mensaje a travs de Pharmacist, community. Por lo general respondemos a los mensajes de MyChart en el transcurso de 1 a 2 das hbiles.  Para renovar recetas, por favor pida a su farmacia que se ponga en contacto con nuestra oficina. Harland Dingwall de fax es [place] [phone].  Si tiene un asunto urgente cuando la clnica est cerrada y que no puede esperar hasta el siguiente da hbil, puede llamar/localizar a su doctor(a) al nmero que aparece a continuacin.   Por favor, tenga en cuenta que aunque hacemos todo lo posible para estar disponibles para asuntos urgentes fuera del horario de [place], no estamos disponibles las 24 horas del da, los 7 das de la [place].   Si tiene un problema urgente y no puede comunicarse con nosotros, puede optar por buscar atencin mdica  en el consultorio de su doctor(a), en una clnica privada, en un centro de atencin urgente o en una sala de emergencias.  Si tiene Engineering geologist, por favor llame inmediatamente al 911 o vaya a la sala de emergencias.  Nmeros de bper  - Dr. Nehemiah Massed: [phone]  - Dra. Moye: [phone]  - Dra. Nicole Kindred: [phone]  En caso de inclemencias del [place], por favor llame a Johnsie Kindred principal al [phone] para una actualizacin sobre el [place] de cualquier retraso o cierre.  Consejos para la medicacin en dermatologa: Por favor, guarde las cajas en las que vienen los medicamentos de uso tpico para ayudarle a seguir las instrucciones sobre dnde y cmo usarlos. Las farmacias generalmente imprimen las instrucciones del medicamento slo en las cajas y no directamente en los tubos del [place].   Si su medicamento es muy caro, por favor, pngase en contacto con Zigmund Daniel llamando al [phone] y presione la opcin 4 o envenos un mensaje a travs de Pharmacist, community.   No podemos decirle  cul ser su copago por los medicamentos por adelantado ya que esto es diferente dependiendo de la cobertura de su seguro. Sin embargo, es posible que podamos encontrar un medicamento sustituto a Electrical engineer un formulario para que el seguro cubra el medicamento que se considera necesario.   Si se requiere una autorizacin previa para que su compaa de seguros [place] su medicamento, por favor permtanos de 1 a 2 das hbiles para completar este proceso.  Los precios de los medicamentos varan con frecuencia dependiendo del Environmental consultant de dnde se surte la receta y alguna farmacias pueden ofrecer precios ms baratos.  El sitio web www.goodrx.com tiene cupones para medicamentos de Airline pilot. Los precios aqu no tienen en cuenta lo que podra costar con la ayuda del seguro (puede ser ms barato con su seguro), pero el sitio web puede darle el precio si no utiliz Research scientist (physical sciences).  -  Puede imprimir el cupn correspondiente y llevarlo con su receta a la farmacia.  - Tambin puede pasar por nuestra oficina durante el horario de atencin regular y Charity fundraiser una tarjeta de cupones de GoodRx.  - Si necesita que su receta se enve electrnicamente a una farmacia diferente, informe a nuestra oficina a travs de MyChart de [place] o por telfono llamando al [phone] y presione la opcin 4.

## 2021-04-28 NOTE — Progress Notes (Signed)
Follow-Up Visit   Subjective  Danielle Duarte is a 61 y.o. female who presents for the following: Annual Exam (Mole check). Yearly mole check hx of Dysplastic nevus. Hx of Lyme's diease.  The patient presents for Total-Body Skin Exam (TBSE) for skin cancer screening and mole check.  The patient has spots, moles and lesions to be evaluated, some may be new or changing and the patient has concerns that these could be cancer.   The following portions of the chart were reviewed this encounter and updated as appropriate:   Tobacco   Allergies   Meds   Problems   Med Hx   Surg Hx   Fam Hx      Review of Systems:  No other skin or systemic complaints except as noted in HPI or Assessment and Plan.  Objective  Well appearing patient in no apparent distress; mood and affect are within normal limits.  A full examination was performed including scalp, head, eyes, ears, nose, lips, neck, chest, axillae, abdomen, back, buttocks, bilateral upper extremities, bilateral lower extremities, hands, feet, fingers, toes, fingernails, and toenails. All findings within normal limits unless otherwise noted below.  Head - Anterior (Face) Clear skin   bilateral hands Scaly erythematous papules and patches +/- dyspigmentation, lichenification, excoriations.   right dorsum hand x 1, right thigh x 1 Stuck-on, waxy, tan-brown papules    Assessment & Plan  History of clinically diagnosed Lyme disease -rashes resolved post doxycycline treatment for 1 month Head - Anterior (Face)  Post lyme's disease  No fevers, chills  No headaches  No diarrhea  Atopic dermatitis bilateral hands  Atopic dermatitis (eczema) is a chronic, relapsing, pruritic condition that can significantly affect quality of life. It is often associated with allergic rhinitis and/or asthma and can require treatment with topical medications, phototherapy, or in severe cases biologic injectable medication (Dupixent; Adbry) or Oral JAK  inhibitors.   Start Mometasone cream apply to affected skin 5 days a week prn flares Start otc Cerave moisturizer apply to skin daily   Related Medications mometasone (ELOCON) 0.1 % cream Apply to affected rash 5 days week as needed  Inflamed seborrheic keratosis right dorsum hand x 1, right thigh x 1 Irritated Reassured benign age-related growth.  Recommend observation.  Discussed cryotherapy if spot(s) become irritated or inflamed.   Destruction of lesion - right dorsum hand x 1, right thigh x 1 Complexity: simple   Destruction method: cryotherapy   Informed consent: discussed and consent obtained   Timeout:  patient name, date of birth, surgical site, and procedure verified Lesion destroyed using liquid nitrogen: Yes   Region frozen until ice ball extended beyond lesion: Yes   Outcome: patient tolerated procedure well with no complications   Post-procedure details: wound care instructions given    Skin cancer screening  Lentigines - Scattered tan macules - Due to sun exposure - Benign-appearing, observe - Recommend daily broad spectrum sunscreen SPF 30+ to sun-exposed areas, reapply every 2 hours as needed. - Call for any changes  Seborrheic Keratoses - Stuck-on, waxy, tan-brown papules and/or plaques  - Benign-appearing - Discussed benign etiology and prognosis. - Observe - Call for any changes  Melanocytic Nevi - Tan-brown and/or pink-flesh-colored symmetric macules and papules - Benign appearing on exam today - Observation - Call clinic for new or changing moles - Recommend daily use of broad spectrum spf 30+ sunscreen to sun-exposed areas.   Hemangiomas - Red papules - Discussed benign nature - Observe - Call for  any changes  Actinic Damage - Chronic condition, secondary to cumulative UV/sun exposure - diffuse scaly erythematous macules with underlying dyspigmentation - Recommend daily broad spectrum sunscreen SPF 30+ to sun-exposed areas, reapply every  2 hours as needed.  - Staying in the shade or wearing long sleeves, sun glasses (UVA+UVB protection) and wide brim hats (4-inch brim around the entire circumference of the hat) are also recommended for sun protection.  - Call for new or changing lesions.  History of Dysplastic Nevi Spinal mid upper back  - No evidence of recurrence today - Recommend regular full body skin exams - Recommend daily broad spectrum sunscreen SPF 30+ to sun-exposed areas, reapply every 2 hours as needed.  - Call if any new or changing lesions are noted between office visits   Skin cancer screening performed today.   Return in about 1 year (around 04/28/2022) for TBSE, Hx of Dysplastic nevus .  IMarye Round, CMA, am acting as scribe for Sarina Ser, MD .  Documentation: I have reviewed the above documentation for accuracy and completeness, and I agree with the above.  Sarina Ser, MD

## 2021-05-07 ENCOUNTER — Encounter: Payer: Self-pay | Admitting: Dermatology

## 2021-05-10 ENCOUNTER — Ambulatory Visit (INDEPENDENT_AMBULATORY_CARE_PROVIDER_SITE_OTHER): Payer: BC Managed Care – PPO | Admitting: Obstetrics & Gynecology

## 2021-05-10 ENCOUNTER — Other Ambulatory Visit: Payer: Self-pay | Admitting: Family Medicine

## 2021-05-10 ENCOUNTER — Other Ambulatory Visit (HOSPITAL_COMMUNITY)
Admission: RE | Admit: 2021-05-10 | Discharge: 2021-05-10 | Disposition: A | Discharge status: Home / Self Care | Payer: BC Managed Care – PPO | Source: Ambulatory Visit | Attending: Obstetrics & Gynecology | Admitting: Obstetrics & Gynecology

## 2021-05-10 ENCOUNTER — Other Ambulatory Visit: Payer: Self-pay

## 2021-05-10 VITALS — BP 136/88 | HR 82 | Wt 207.6 lb

## 2021-05-10 DIAGNOSIS — R87810 Cervical high risk human papillomavirus (HPV) DNA test positive: Secondary | ICD-10-CM | POA: Insufficient documentation

## 2021-05-10 DIAGNOSIS — Z01419 Encounter for gynecological examination (general) (routine) without abnormal findings: Secondary | ICD-10-CM | POA: Diagnosis present

## 2021-05-10 DIAGNOSIS — Z1231 Encounter for screening mammogram for malignant neoplasm of breast: Secondary | ICD-10-CM

## 2021-05-10 DIAGNOSIS — R87610 Atypical squamous cells of undetermined significance on cytologic smear of cervix (ASC-US): Secondary | ICD-10-CM | POA: Diagnosis present

## 2021-05-10 NOTE — Progress Notes (Signed)
GYNECOLOGY ANNUAL PREVENTATIVE CARE ENCOUNTER NOTE  History:     Danielle Duarte is a 61 y.o. G2P2 female here for a routine annual gynecologic exam.  Current complaints: none.   Her daughter is getting married in June 2023.  Denies abnormal vaginal bleeding, discharge, pelvic pain or other gynecologic concerns.    Gynecologic History Patient's last menstrual period was 12/04/2011. Contraception: post menopausal status Last Pap: 04/20/2020. Results were: ASCUS with positive HRHPV, benign colposcopy pathology Last mammogram: 08/11/2020. Results were: normal Last colonoscopy 07/26/2020. Results were benign  Obstetric History OB History  Gravida Para Term Preterm AB Living  2 2       2   SAB IAB Ectopic Multiple Live Births          2    # Outcome Date GA Lbr Len/2nd Weight Sex Delivery Anes PTL Lv  2 Para 1995        LIV  1 Para 1993        LIV    Past Medical History:  Diagnosis Date   Abnormal Pap smear    Arthritis    right knee   Dysplastic nevus 03/24/2019   spinal mid upper back, mod atypia    Fibroids    Normal cytology on  Pap smear with positive HPV test 06/12/2012   Repeat cotesting in one year    Paroxysmal supraventricular tachycardia ([place]) 01/05/2014   PONV (postoperative nausea and vomiting)     Past Surgical History:  Procedure Laterality Date   APPENDECTOMY     COLONOSCOPY  10/10/2010   COLONOSCOPY WITH PROPOFOL N/A 07/26/2020   Procedure: COLONOSCOPY WITH PROPOFOL;  Surgeon: Lin Landsman, MD;  Location: [facility];  Service: Gastroenterology;  Laterality: N/A;  Patient tested positive for COVID on 06/11/20.  She will email office a copy of these results.   COLPOSCOPY  03/01/10   HERNIA REPAIR  November 2014   LAPAROSCOPIC LYSIS OF ADHESIONS  02/28/2012   Procedure: LAPAROSCOPIC LYSIS OF ADHESIONS;  Surgeon: Osborne Oman, MD;  Location: [facility];  Service: Gynecology;  Laterality: N/A;   LAPAROSCOPY  02/28/2012   Procedure: LAPAROSCOPY OPERATIVE;   Surgeon: Osborne Oman, MD;  Location: [facility];  Service: Gynecology;  Laterality: N/A;   SALPINGOOPHORECTOMY  02/28/2012   Procedure: SALPINGO OOPHORECTOMY;  Surgeon: Osborne Oman, MD;  Location: [facility];  Service: Gynecology;  Laterality: Left;   TOTAL HIP ARTHROPLASTY  04/19/15   Left   TOTAL KNEE ARTHROPLASTY Left 08/26/2018   Dr. Claiborne Billings at [facility]    Current Outpatient Medications on File Prior to Visit  Medication Sig Dispense Refill   ADVAIR [facility] 115-21 MCG/ACT inhaler      azelastine (ASTELIN) 0.1 % nasal spray Place into both nostrils 2 (two) times daily. Use in each nostril as directed     levocetirizine (XYZAL) 5 MG tablet Take 5 mg by mouth every evening.     levothyroxine (SYNTHROID) 75 MCG tablet Take 1 tablet (75 mcg total) by mouth daily. 90 tablet 3   mometasone (ELOCON) 0.1 % cream Apply to affected rash 5 days week as needed 45 g 2   Multiple Vitamin (MULTIVITAMIN WITH MINERALS) TABS Take 1 tablet by mouth daily.     PROAIR HFA 108 (90 Base) MCG/ACT inhaler TAKE 2 PUFFS BY MOUTH EVERY 4 TO 6 HOURS AS NEEDED . MUST LAST 90 DAYS PER MD  0   No current facility-administered medications on file prior to visit.  Allergies  Allergen Reactions   Dust Mite Extract    Other Other (See Comments)    Dust mites   Pollen Extract     from trees and weeds    Social History:  reports that she quit smoking about 34 years ago. Her smoking use included cigarettes. She has never used smokeless tobacco. She reports that she does not drink alcohol and does not use drugs.  Family History  Problem Relation Age of Onset   Hypertension Mother    Hypertension Father    Diabetes Paternal Uncle     The following portions of the patient's history were reviewed and updated as appropriate: allergies, current medications, past family history, past medical history, past social history, past surgical history and problem list.  Review of Systems Pertinent items noted in HPI and remainder  of comprehensive ROS otherwise negative.  Physical Exam:  BP (!) 149/84    Pulse 81    Wt 207 lb 9.6 oz (94.2 kg)    LMP 12/04/2011    BMI 35.63 kg/m  CONSTITUTIONAL: Well-developed, well-nourished female in no acute distress.  HENT:  Normocephalic, atraumatic, External right and left ear normal.  EYES: Conjunctivae and EOM are normal. Pupils are equal, round, and reactive to light. No scleral icterus.  NECK: Normal range of motion, supple, no masses.  Normal thyroid.  SKIN: Skin is warm and dry. No rash noted. Not diaphoretic. No erythema. No pallor. MUSCULOSKELETAL: Normal range of motion. No tenderness.  No cyanosis, clubbing, or edema.  NEUROLOGIC: Alert and oriented to person, place, and time. Normal reflexes, muscle tone coordination.  PSYCHIATRIC: Normal mood and affect. Normal behavior. Normal judgment and thought content. CARDIOVASCULAR: Normal heart rate noted, regular rhythm RESPIRATORY: Clear to auscultation bilaterally. Effort and breath sounds normal, no problems with respiration noted. BREASTS: Symmetric in size. No masses, tenderness, skin changes, nipple drainage, or lymphadenopathy bilaterally. Performed in the presence of a chaperone. ABDOMEN: Soft, no distention noted.  No tenderness, rebound or guarding.  PELVIC: Normal appearing external genitalia and urethral meatus with moderate atrophy; atrophic vaginal mucosa and cervix.  No abnormal discharge noted.  Pap smear obtained.  Normal uterine size, no other palpable masses, no uterine or adnexal tenderness.  Performed in the presence of a chaperone.   Assessment and Plan:      1. ASCUS with positive high risk HPV cervical pap on 04/20/2020 2. Well woman exam with routine gynecological exam - Cytology - PAP Will follow up results of pap smear and manage accordingly. Mammogram and colonoscopy are up to date Routine preventative health maintenance measures emphasized. Please refer to After Visit Summary for other counseling  recommendations.      Verita Schneiders, MD, [place] for [employer], [place]

## 2021-05-16 LAB — CYTOLOGY - PAP
Comment: NEGATIVE
Comment: NEGATIVE
Diagnosis: NEGATIVE
HPV 16: NEGATIVE
HPV 18 / 45: NEGATIVE
High risk HPV: POSITIVE — AB

## 2021-06-07 ENCOUNTER — Other Ambulatory Visit: Payer: Self-pay | Admitting: Family Medicine

## 2021-06-07 DIAGNOSIS — E039 Hypothyroidism, unspecified: Secondary | ICD-10-CM

## 2021-07-05 ENCOUNTER — Encounter: Payer: Self-pay | Admitting: Family Medicine

## 2021-07-05 ENCOUNTER — Ambulatory Visit (INDEPENDENT_AMBULATORY_CARE_PROVIDER_SITE_OTHER): Payer: BC Managed Care – PPO | Admitting: Family Medicine

## 2021-07-05 VITALS — BP 136/87 | HR 80 | Temp 97.5°F | Ht 63.0 in | Wt 207.5 lb

## 2021-07-05 DIAGNOSIS — E039 Hypothyroidism, unspecified: Secondary | ICD-10-CM | POA: Diagnosis not present

## 2021-07-05 DIAGNOSIS — E6609 Other obesity due to excess calories: Secondary | ICD-10-CM

## 2021-07-05 DIAGNOSIS — Z0000 Encounter for general adult medical examination without abnormal findings: Secondary | ICD-10-CM | POA: Diagnosis not present

## 2021-07-05 NOTE — Patient Instructions (Signed)
Please review the attached list of medications and notify my office if there are any errors.  ? ?It is recommended to engage in 150 minutes of moderate exercise every week.   ?

## 2021-07-05 NOTE — Progress Notes (Signed)
? ? ? ?Complete physical exam ? ? ?Patient: Danielle Duarte   DOB: 11/25/1960   61 y.o. Female  MRN: [id number] ?Visit Date: 07/05/2021 ? ?Today's healthcare provider: Lelon Huh, MD  ? ?No chief complaint on file. ? ?Subjective  ?  ?Danielle Duarte is a 61 y.o. female who presents today for a complete physical exam.  ?She reports consuming a general diet. Gym/ health club routine includes high impact aerobics . She generally feels well. She reports sleeping well. She does not have additional problems to discuss today. She has recently seen her gyn, optometrist and dermatologist.  ? ? ? ?Past Medical History:  ?Diagnosis Date  ? Abnormal Pap smear   ? Arthritis   ? right knee  ? Dysplastic nevus 03/24/2019  ? spinal mid upper back, mod atypia   ? Fibroids   ? Normal cytology on  Pap smear with positive HPV test 06/12/2012  ? Repeat cotesting in one year   ? Paroxysmal supraventricular tachycardia ([place]) 01/05/2014  ? PONV (postoperative nausea and vomiting)   ? ?Past Surgical History:  ?Procedure Laterality Date  ? APPENDECTOMY    ? COLONOSCOPY  10/10/2010  ? COLONOSCOPY WITH PROPOFOL N/A 07/26/2020  ? Procedure: COLONOSCOPY WITH PROPOFOL;  Surgeon: Lin Landsman, MD;  Location: [facility];  Service: Gastroenterology;  Laterality: N/A;  Patient tested positive for COVID on 06/11/20.  She will email office a copy of these results.  ? COLPOSCOPY  03/01/10  ? HERNIA REPAIR  November 2014  ? LAPAROSCOPIC LYSIS OF ADHESIONS  02/28/2012  ? Procedure: LAPAROSCOPIC LYSIS OF ADHESIONS;  Surgeon: Osborne Oman, MD;  Location: [facility];  Service: Gynecology;  Laterality: N/A;  ? LAPAROSCOPY  02/28/2012  ? Procedure: LAPAROSCOPY OPERATIVE;  Surgeon: Osborne Oman, MD;  Location: [facility];  Service: Gynecology;  Laterality: N/A;  ? SALPINGOOPHORECTOMY  02/28/2012  ? Procedure: SALPINGO OOPHORECTOMY;  Surgeon: Osborne Oman, MD;  Location: [facility];  Service: Gynecology;  Laterality: Left;  ? TOTAL HIP ARTHROPLASTY   04/19/15  ? Left  ? TOTAL KNEE ARTHROPLASTY Left 08/26/2018  ? [facility] Claib[facility] at [facility]  ? ?Social History  ? ?Socioeconomic History  ? Marital status: Divorced  ?  Spouse name: Not on file  ? Number of children: Not on file  ? Years of education: Not on file  ? Highest education level: Not on file  ?Occupational History  ? Not on file  ?Tobacco Use  ? Smoking status: Former  ?  Types: Cigarettes  ?  Quit date: 03/13/1987  ?  Years since quitting: 34.3  ? Smokeless tobacco: Never  ?Vaping Use  ? Vaping Use: Never used  ?Substance and Sexual Activity  ? Alcohol use: No  ? Drug use: No  ? Sexual activity: Not Currently  ?Other Topics Concern  ? Not on file  ?Social History Narrative  ? Not on file  ? ?Social Determinants of Health  ? ?Financial Resource Strain: Not on file  ?Food Insecurity: Not on file  ?Transportation Needs: Not on file  ?Physical Activity: Not on file  ?Stress: Not on file  ?Social Connections: Not on file  ?Intimate Partner Violence: Not on file  ? ?Family Status  ?Relation Name Status  ? Mother  Alive  ? Father  Alive  ? Annamarie Major  Alive  ? ?Family History  ?Problem Relation Age of Onset  ? Hypertension Mother   ? Hypertension Father   ? Diabetes Paternal Uncle   ? ?  Allergies  ?Allergen Reactions  ? Dust Mite Extract   ? Other Other (See Comments)  ?  Dust mites  ? Pollen Extract   ?  from trees and weeds  ?  ?Patient Care Team: ?Birdie Sons, MD as PCP - General (Family Medicine) ?Tiajuana Amass, MD as Referring Physician (Allergy and Immunology) ?Earnestine Leys, MD (Specialist) ?Donnamae Jude, MD as Consulting Physician (Obstetrics and Gynecology) ?Ralene Bathe, MD (Dermatology)  ? ?Medications: ?Outpatient Medications Prior to Visit  ?Medication Sig  ? ADVAIR HFA 115-21 MCG/ACT inhaler   ? azelastine (ASTELIN) 0.1 % nasal spray Place into both nostrils 2 (two) times daily. Use in each nostril as directed  ? levocetirizine (XYZAL) 5 MG tablet Take 5 mg by mouth every evening.  ? levothyroxine  (SYNTHROID) 75 MCG tablet TAKE 1 TABLET BY MOUTH EVERY DAY  ? mometasone (ELOCON) 0.1 % cream Apply to affected rash 5 days week as needed  ? Multiple Vitamin (MULTIVITAMIN WITH MINERALS) TABS Take 1 tablet by mouth daily.  ? PROAIR HFA 108 (90 Base) MCG/ACT inhaler TAKE 2 PUFFS BY MOUTH EVERY 4 TO 6 HOURS AS NEEDED . MUST LAST 90 DAYS PER MD  ? ?No facility-administered medications prior to visit.  ? ? ? ? ? ? Objective  ?  ?BP 136/87 (BP Location: Left Arm, Patient Position: Sitting, Cuff Size: Normal)   Pulse 80   Temp (!) 97.5 ?F (36.4 ?C) (Oral)   Ht '5\' 3"'$  (1.6 m)   Wt 207 lb 8 oz (94.1 kg)   LMP 12/04/2011   SpO2 98%   BMI 36.76 kg/m?  ? ? ? ?Physical Exam  ? ?General Appearance:    Obese female. Alert, cooperative, in no acute distress, appears stated age   ?Head:    Normocephalic, without obvious abnormality, atraumatic  ?Eyes:    PERRL, conjunctiva/corneas clear, EOM's intact, fundi  ?  benign, both eyes  ?Ears:    Normal TM's and external ear canals, both ears  ?Nose:   Nares normal, septum midline, mucosa normal, no drainage  ?  or sinus tenderness  ?Throat:   Lips, mucosa, and tongue normal; teeth and gums normal  ?Neck:   Supple, symmetrical, trachea midline, no adenopathy;  ?  thyroid:  no enlargement/tenderness/nodules; no carotid ?  bruit or JVD  ?Back:     Symmetric, no curvature, ROM normal, no CVA tenderness  ?Lungs:     Clear to auscultation bilaterally, respirations unlabored  ?Chest Wall:    No tenderness or deformity  ? Heart:    Normal heart rate. Normal rhythm. No murmurs, rubs, or gallops.  ?  ?Breast Exam:     Deferred to gyn  ?Abdomen:     Soft, non-tender, bowel sounds active all four quadrants,  ?  no masses, no organomegaly  ?Pelvic:    Deferred to gyn  ?Extremities:   All extremities are intact. No cyanosis or edema  ?Pulses:   2+ and symmetric all extremities  ?Skin:   Skin color, texture, turgor normal, no rashes or lesions  ?Lymph nodes:   Cervical, supraclavicular, and  axillary nodes normal  ?Neurologic:   CNII-XII intact, normal strength, sensation and reflexes  ?  throughout  ?  ? ?Last depression screening scores ? ?  12/14/2020  ?  9:53 AM 05/11/2020  ?  3:18 PM 01/09/2020  ?  4:16 PM  ?PHQ 2/9 Scores  ?PHQ - 2 Score 0 0 0  ?PHQ- 9 Score 0 0 0  ? ?  Last fall risk screening ? ?  12/14/2020  ?  9:53 AM  ?Fall Risk   ?Falls in the past year? 0  ?Number falls in past yr: 0  ?Injury with Fall? 0  ?Risk for fall due to : No Fall Risks  ?Follow up Falls evaluation completed  ? ?Last Audit-C alcohol use screening ? ?  12/14/2020  ?  9:53 AM  ?Alcohol Use Disorder Test (AUDIT)  ?1. How often do you have a drink containing alcohol? 0  ?2. How many drinks containing alcohol do you have on a typical day when you are drinking? 0  ?3. How often do you have six or more drinks on one occasion? 0  ?AUDIT-C Score 0  ? ?A score of 3 or more in women, and 4 or more in men indicates increased risk for alcohol abuse, EXCEPT if all of the points are from question 1  ? ?No results found for any visits on 07/05/21. ? Assessment & Plan  ?  ?Routine Health Maintenance and Physical Exam ? ?Exercise Activities and Dietary recommendations ? Goals   ? ?  Exercise 150 minutes per week (moderate activity)   ? ?  ? ? ?Immunization History  ?Administered Date(s) Administered  ? Influenza, High Dose Seasonal PF 03/21/2016, 02/05/2018  ? Influenza,inj,Quad PF,6+ Mos 03/30/2015, 02/20/2017, 12/10/2018  ? Influenza-Unspecified 12/12/2015, 12/24/2017, 12/25/2019, 12/07/2020  ? PFIZER(Purple Top)SARS-COV-2 Vaccination 05/13/2019, 06/10/2019, 11/28/2019, 02/29/2020  ? Pneumococcal Polysaccharide-23 04/09/2018  ? Tdap 07/19/2015  ? ? ?Health Maintenance  ?Topic Date Due  ? HIV Screening  Never done  ? Zoster Vaccines- Shingrix (1 of 2) Never done  ? COVID-19 Vaccine (5 - Booster for Pfizer series) 04/25/2020  ? INFLUENZA VACCINE  10/11/2021  ? MAMMOGRAM  08/12/2022  ? DEXA SCAN  06/01/2023  ? COLONOSCOPY (Pts 45-79yr  Insurance coverage will need to be confirmed)  07/27/2023  ? PAP SMEAR-Modifier  05/10/2024  ? TETANUS/TDAP  07/18/2025  ? Hepatitis C Screening  Completed  ? HPV VACCINES  Aged Out  ? ? ?Discussed health benefits of physic

## 2021-07-07 LAB — COMPREHENSIVE METABOLIC PANEL
ALT: 20 IU/L (ref 0–32)
AST: 18 IU/L (ref 0–40)
Albumin/Globulin Ratio: 1.9 (ref 1.2–2.2)
Albumin: 4.2 g/dL (ref 3.8–4.8)
Alkaline Phosphatase: 102 IU/L (ref 44–121)
BUN/Creatinine Ratio: 15 (ref 12–28)
BUN: 11 mg/dL (ref 8–27)
Bilirubin Total: 0.5 mg/dL (ref 0.0–1.2)
CO2: 24 mmol/L (ref 20–29)
Calcium: 9.1 mg/dL (ref 8.7–10.3)
Chloride: 105 mmol/L (ref 96–106)
Creatinine, Ser: 0.74 mg/dL (ref 0.57–1.00)
Globulin, Total: 2.2 g/dL (ref 1.5–4.5)
Glucose: 89 mg/dL (ref 70–99)
Potassium: 4.3 mmol/L (ref 3.5–5.2)
Sodium: 142 mmol/L (ref 134–144)
Total Protein: 6.4 g/dL (ref 6.0–8.5)
eGFR: 91 mL/min/{1.73_m2} (ref >59)

## 2021-07-07 LAB — T4, FREE: Free T4: 1.37 ng/dL (ref 0.82–1.77)

## 2021-07-07 LAB — CBC
Hematocrit: 43.6 % (ref 34.0–46.6)
Hemoglobin: 14.3 g/dL (ref 11.1–15.9)
MCH: 29.7 pg (ref 26.6–33.0)
MCHC: 32.8 g/dL (ref 31.5–35.7)
MCV: 91 fL (ref 79–97)
Platelets: 331 10*3/uL (ref 150–450)
RBC: 4.82 x10E6/uL (ref 3.77–5.28)
RDW: 13 % (ref 11.7–15.4)
WBC: 7.9 10*3/uL (ref 3.4–10.8)

## 2021-07-07 LAB — LIPID PANEL
Chol/HDL Ratio: 4.2 ratio (ref 0.0–4.4)
Cholesterol, Total: 242 mg/dL — ABNORMAL HIGH (ref 100–199)
HDL: 57 mg/dL (ref >39)
LDL Chol Calc (NIH): 163 mg/dL — ABNORMAL HIGH (ref 0–99)
Triglycerides: 126 mg/dL (ref 0–149)
VLDL Cholesterol Cal: 22 mg/dL (ref 5–40)

## 2021-07-07 LAB — TSH: TSH: 1.34 u[IU]/mL (ref 0.450–4.500)

## 2021-09-08 ENCOUNTER — Telehealth: Payer: Self-pay

## 2021-09-08 NOTE — Telephone Encounter (Signed)
Copied from [place] [id number]. Topic: Appointment Scheduling - Scheduling Inquiry for Clinic >> Sep 08, 2021  1:07 PM Rudene Anda wrote: Reason for CRM: Pt wanted to know if she could come in and get a TB test, please advise.

## 2021-09-09 NOTE — Telephone Encounter (Signed)
Requesting TB test and also has 2 forms to fill out which she will bring with her at lab visit. Needs for substitute teaching position.  Pt advised you will be back in office on Monday.

## 2021-09-09 NOTE — Telephone Encounter (Signed)
Pt called back in to follow up on request. Pt says that she has to have form completed for work. Pt would like further assistance    [phone]-  please advise.

## 2021-09-09 NOTE — Telephone Encounter (Signed)
Patient's last CPE was 07/05/2021. I called patient to ask about what type of forms she had. She was driving at the time of my call and stated that she would upload the forms to mychart so that we could review them. Patient plans to upload the forms some time next week. After we review the forms, patient would like [place] to let her know if she needs an office visit, or whether we can just order the required test.

## 2021-09-10 ENCOUNTER — Encounter: Payer: Self-pay | Admitting: Family Medicine

## 2021-09-16 ENCOUNTER — Telehealth: Payer: Self-pay | Admitting: Family Medicine

## 2021-09-16 ENCOUNTER — Telehealth: Payer: Self-pay

## 2021-09-16 NOTE — Telephone Encounter (Signed)
Forms placed on Dr. Maralyn Sago desk for review.

## 2021-09-16 NOTE — Telephone Encounter (Signed)
Patient sent in 2 forms, on September 10, 2021, on her mychart to be completed.  As of yet, they are not done.   I have printed the forms off and sent back.  She needs to pick these up by Tuesday 09/20/21.

## 2021-09-16 NOTE — Telephone Encounter (Signed)
All forms completed by Dr. Caryn Section except the TB form. Patient needs to answer the TB screening questionnaire. We need this completed before Dr. Caryn Section can sign off on paperwork. Left message for patient to call back. OK for [facility] triage to advise.

## 2021-09-16 NOTE — Telephone Encounter (Signed)
Pt called in regards to paperwork that she sent in through my chart. Pt states that she believes that one of the forms requires her to get screened for TB. She would like a call back to discuss. Please advise, [phone].

## 2021-11-01 ENCOUNTER — Ambulatory Visit
Admission: RE | Admit: 2021-11-01 | Discharge: 2021-11-01 | Disposition: A | Discharge status: Home / Self Care | Payer: BC Managed Care – PPO | Source: Ambulatory Visit | Attending: Family Medicine | Admitting: Family Medicine

## 2021-11-01 DIAGNOSIS — Z1231 Encounter for screening mammogram for malignant neoplasm of breast: Secondary | ICD-10-CM

## 2021-11-17 ENCOUNTER — Ambulatory Visit: Payer: BC Managed Care – PPO

## 2021-12-01 ENCOUNTER — Other Ambulatory Visit: Payer: Self-pay | Admitting: Family Medicine

## 2021-12-01 DIAGNOSIS — E039 Hypothyroidism, unspecified: Secondary | ICD-10-CM

## 2021-12-06 ENCOUNTER — Ambulatory Visit (INDEPENDENT_AMBULATORY_CARE_PROVIDER_SITE_OTHER): Payer: BC Managed Care – PPO

## 2021-12-06 DIAGNOSIS — Z23 Encounter for immunization: Secondary | ICD-10-CM

## 2022-03-02 ENCOUNTER — Encounter: Payer: Self-pay | Admitting: Dermatology

## 2022-03-02 DIAGNOSIS — L219 Seborrheic dermatitis, unspecified: Secondary | ICD-10-CM

## 2022-03-02 MED ORDER — HYDROCORTISONE 2.5 % EX SOLN: CUTANEOUS | 3 refills | Status: DC

## 2022-05-04 ENCOUNTER — Ambulatory Visit: Payer: BC Managed Care – PPO | Admitting: Dermatology

## 2022-05-04 VITALS — BP 129/83

## 2022-05-04 DIAGNOSIS — L219 Seborrheic dermatitis, unspecified: Secondary | ICD-10-CM | POA: Diagnosis not present

## 2022-05-04 DIAGNOSIS — L578 Other skin changes due to chronic exposure to nonionizing radiation: Secondary | ICD-10-CM | POA: Diagnosis not present

## 2022-05-04 DIAGNOSIS — Z1283 Encounter for screening for malignant neoplasm of skin: Secondary | ICD-10-CM

## 2022-05-04 DIAGNOSIS — Z7189 Other specified counseling: Secondary | ICD-10-CM

## 2022-05-04 DIAGNOSIS — L814 Other melanin hyperpigmentation: Secondary | ICD-10-CM

## 2022-05-04 DIAGNOSIS — I8393 Asymptomatic varicose veins of bilateral lower extremities: Secondary | ICD-10-CM

## 2022-05-04 DIAGNOSIS — L820 Inflamed seborrheic keratosis: Secondary | ICD-10-CM | POA: Diagnosis not present

## 2022-05-04 DIAGNOSIS — L821 Other seborrheic keratosis: Secondary | ICD-10-CM

## 2022-05-04 DIAGNOSIS — Z86018 Personal history of other benign neoplasm: Secondary | ICD-10-CM

## 2022-05-04 DIAGNOSIS — D229 Melanocytic nevi, unspecified: Secondary | ICD-10-CM

## 2022-05-04 DIAGNOSIS — Z79899 Other long term (current) drug therapy: Secondary | ICD-10-CM

## 2022-05-04 MED ORDER — FLUOCINOLONE ACETONIDE 0.01 % OT OIL: 1.0000 "application " | TOPICAL_OIL | OTIC | 6 refills | Status: AC

## 2022-05-04 NOTE — Patient Instructions (Signed)
Cryotherapy Aftercare  Wash gently with soap and water everyday.   Apply Vaseline and Band-Aid daily until healed.     Due to recent changes in healthcare laws, you may see results of your pathology and/or laboratory studies on MyChart before the doctors have had a chance to review them. We understand that in some cases there may be results that are confusing or concerning to you. Please understand that not all results are received at the same time and often the doctors may need to interpret multiple results in order to provide you with the best plan of care or course of treatment. Therefore, we ask that you please give us 2 business days to thoroughly review all your results before contacting the office for clarification. Should we see a critical lab result, you will be contacted sooner.   If You Need Anything After Your Visit  If you have any questions or concerns for your doctor, please call our main line at [phone] and press option 4 to reach your doctor's medical assistant. If no one answers, please leave a voicemail as directed and we will return your call as soon as possible. Messages left after 4 pm will be answered the following business day.   You may also send us a message via MyChart. We typically respond to MyChart messages within 1-2 business days.  For prescription refills, please ask your pharmacy to contact our office. Our fax number is [phone].  If you have an urgent issue when the clinic is closed that cannot wait until the next business day, you can page your doctor at the number below.    Please note that while we do our best to be available for urgent issues outside of office hours, we are not available 24/7.   If you have an urgent issue and are unable to reach us, you may choose to seek medical care at your doctor's office, retail clinic, urgent care center, or emergency room.  If you have a medical emergency, please immediately call 911 or go to the  emergency department.  Pager Numbers  - Dr. Kowalski: [phone]  - Dr. Moye: [phone]  - Dr. Stewart: [phone]  In the event of inclement weather, please call our main line at [phone] for an update on the status of any delays or closures.  Dermatology Medication Tips: Please keep the boxes that topical medications come in in order to help keep track of the instructions about where and how to use these. Pharmacies typically print the medication instructions only on the boxes and not directly on the medication tubes.   If your medication is too expensive, please contact our office at [phone] option 4 or send us a message through MyChart.   We are unable to tell what your co-pay for medications will be in advance as this is different depending on your insurance coverage. However, we may be able to find a substitute medication at lower cost or fill out paperwork to get insurance to cover a needed medication.   If a prior authorization is required to get your medication covered by your insurance company, please allow us 1-2 business days to complete this process.  Drug prices often vary depending on where the prescription is filled and some pharmacies may offer cheaper prices.  The website www.goodrx.com contains coupons for medications through different pharmacies. The prices here do not account for what the cost may be with help from insurance (it may be cheaper with your insurance), but the website can   give you the price if you did not use any insurance.  - You can print the associated coupon and take it with your prescription to the pharmacy.  - You may also stop by our office during regular business hours and pick up a GoodRx coupon card.  - If you need your prescription sent electronically to a different pharmacy, notify our office through [facility] MyChart or by phone at [phone] option 4.     Si Usted Necesita Algo Despus de Su Visita  Tambin puede  enviarnos un mensaje a travs de MyChart. Por lo general respondemos a los mensajes de MyChart en el transcurso de 1 a 2 das hbiles.  Para renovar recetas, por favor pida a su farmacia que se ponga en contacto con nuestra oficina. Nuestro nmero de fax es el [phone].  Si tiene un asunto urgente cuando la clnica est cerrada y que no puede esperar hasta el siguiente da hbil, puede llamar/localizar a su doctor(a) al nmero que aparece a continuacin.   Por favor, tenga en cuenta que aunque hacemos todo lo posible para estar disponibles para asuntos urgentes fuera del horario de oficina, no estamos disponibles las 24 horas del da, los 7 das de la semana.   Si tiene un problema urgente y no puede comunicarse con nosotros, puede optar por buscar atencin mdica  en el consultorio de su doctor(a), en una clnica privada, en un centro de atencin urgente o en una sala de emergencias.  Si tiene una emergencia mdica, por favor llame inmediatamente al 911 o vaya a la sala de emergencias.  Nmeros de bper  - Dr. Kowalski: [phone]  - Dra. Moye: [phone]  - Dra. Stewart: [phone]  En caso de inclemencias del tiempo, por favor llame a nuestra lnea principal al [phone] para una actualizacin sobre el estado de cualquier retraso o cierre.  Consejos para la medicacin en dermatologa: Por favor, guarde las cajas en las que vienen los medicamentos de uso tpico para ayudarle a seguir las instrucciones sobre dnde y cmo usarlos. Las farmacias generalmente imprimen las instrucciones del medicamento slo en las cajas y no directamente en los tubos del medicamento.   Si su medicamento es muy caro, por favor, pngase en contacto con nuestra oficina llamando al [phone] y presione la opcin 4 o envenos un mensaje a travs de MyChart.   No podemos decirle cul ser su copago por los medicamentos por adelantado ya que esto es diferente dependiendo de la cobertura de su seguro.  Sin embargo, es posible que podamos encontrar un medicamento sustituto a menor costo o llenar un formulario para que el seguro cubra el medicamento que se considera necesario.   Si se requiere una autorizacin previa para que su compaa de seguros cubra su medicamento, por favor permtanos de 1 a 2 das hbiles para completar este proceso.  Los precios de los medicamentos varan con frecuencia dependiendo del lugar de dnde se surte la receta y alguna farmacias pueden ofrecer precios ms baratos.  El sitio web www.goodrx.com tiene cupones para medicamentos de diferentes farmacias. Los precios aqu no tienen en cuenta lo que podra costar con la ayuda del seguro (puede ser ms barato con su seguro), pero el sitio web puede darle el precio si no utiliz ningn seguro.  - Puede imprimir el cupn correspondiente y llevarlo con su receta a la farmacia.  - Tambin puede pasar por nuestra oficina durante el horario de atencin regular y recoger una tarjeta de cupones de GoodRx.  -   Si necesita que su receta se enve electrnicamente a una farmacia diferente, informe a nuestra oficina a travs de MyChart de [address] o por telfono llamando al [phone] y presione la opcin 4.  

## 2022-05-04 NOTE — Progress Notes (Signed)
Follow-Up Visit   Subjective  Danielle Duarte is a 62 y.o. female who presents for the following: Annual Exam (History of dysplastic nevus - The patient presents for Total-Body Skin Exam (TBSE) for skin cancer screening and mole check.  The patient has spots, moles and lesions to be evaluated, some may be new or changing and the patient has concerns that these could be cancer./).  The following portions of the chart were reviewed this encounter and updated as appropriate:   Tobacco  Allergies  Meds  Problems  Med Hx  Surg Hx  Fam Hx     Review of Systems:  No other skin or systemic complaints except as noted in HPI or Assessment and Plan.  Objective  Well appearing patient in no apparent distress; mood and affect are within normal limits.  A full examination was performed including scalp, head, eyes, ears, nose, lips, neck, chest, axillae, abdomen, back, buttocks, bilateral upper extremities, bilateral lower extremities, hands, feet, fingers, toes, fingernails, and toenails. All findings within normal limits unless otherwise noted below.  Right dorsum hand (2) Erythematous stuck-on, waxy papule or plaque  Right Medial Thigh Erythematous stuck-on, waxy papule or plaque  Legs Varicose veins   Assessment & Plan   History of Dysplastic Nevi - No evidence of recurrence today - Recommend regular full body skin exams - Recommend daily broad spectrum sunscreen SPF 30+ to sun-exposed areas, reapply every 2 hours as needed.  - Call if any new or changing lesions are noted between office visits  Lentigines - Scattered tan macules - Due to sun exposure - Benign-appearing, observe - Recommend daily broad spectrum sunscreen SPF 30+ to sun-exposed areas, reapply every 2 hours as needed. - Call for any changes  Seborrheic Keratoses - Stuck-on, waxy, tan-brown papules and/or plaques  - Benign-appearing - Discussed benign etiology and prognosis. - Observe - Call for any  changes  Melanocytic Nevi - Tan-brown and/or pink-flesh-colored symmetric macules and papules - Benign appearing on exam today - Observation - Call clinic for new or changing moles - Recommend daily use of broad spectrum spf 30+ sunscreen to sun-exposed areas.   Hemangiomas - Red papules - Discussed benign nature - Observe - Call for any changes  Actinic Damage - Chronic condition, secondary to cumulative UV/sun exposure - diffuse scaly erythematous macules with underlying dyspigmentation - Recommend daily broad spectrum sunscreen SPF 30+ to sun-exposed areas, reapply every 2 hours as needed.  - Staying in the shade or wearing long sleeves, sun glasses (UVA+UVB protection) and wide brim hats (4-inch brim around the entire circumference of the hat) are also recommended for sun protection.  - Call for new or changing lesions.  Skin cancer screening performed today.  Inflamed seborrheic keratosis (2) Right dorsum hand Inflamed SK vs Wart  Destruction of lesion - Right dorsum hand Complexity: simple   Destruction method: cryotherapy   Informed consent: discussed and consent obtained   Timeout:  patient name, date of birth, surgical site, and procedure verified Lesion destroyed using liquid nitrogen: Yes   Region frozen until ice ball extended beyond lesion: Yes   Outcome: patient tolerated procedure well with no complications   Post-procedure details: wound care instructions given    Seborrheic keratosis, inflamed Right Medial Thigh Destruction of lesion - Right Medial Thigh Complexity: simple   Destruction method: cryotherapy   Informed consent: discussed and consent obtained   Timeout:  patient name, date of birth, surgical site, and procedure verified Lesion destroyed using liquid nitrogen:  Yes   Region frozen until ice ball extended beyond lesion: Yes   Outcome: patient tolerated procedure well with no complications   Post-procedure details: wound care instructions  given    Asymptomatic varicose veins of both lower extremities Legs Benign-appearing.  Observation.  Call clinic for new or changing lesions.  Recommend daily use of broad spectrum spf 30+ sunscreen to sun-exposed areas.  Discussed and counseled on sclerotherapy.  Seborrheic dermatitis Ears With pruritus Seborrheic Dermatitis  -  is a chronic persistent rash characterized by pinkness and scaling most commonly of the mid face but also can occur on the scalp (dandruff), ears; mid chest, mid back and groin.  It tends to be exacerbated by stress and cooler weather.  People who have neurologic disease may experience new onset or exacerbation of existing seborrheic dermatitis.  The condition is not curable but treatable and can be controlled.  Start Fluocinolone oil 1-3 times per week prn  Fluocinolone Acetonide 0.01 % OIL - Ears Place 1 application  in ear(s) as directed. 1-3 times per week prn  Return in about 1 year (around 05/05/2023) for TBSE.  I, Ashok Cordia, CMA, am acting as scribe for Sarina Ser, MD . Documentation: I have reviewed the above documentation for accuracy and completeness, and I agree with the above.  Sarina Ser, MD

## 2022-05-10 ENCOUNTER — Encounter: Payer: Self-pay | Admitting: Dermatology

## 2022-05-12 ENCOUNTER — Encounter: Payer: Self-pay | Admitting: Dermatology

## 2022-10-12 ENCOUNTER — Other Ambulatory Visit: Payer: Self-pay | Admitting: Obstetrics & Gynecology

## 2022-10-12 DIAGNOSIS — Z1231 Encounter for screening mammogram for malignant neoplasm of breast: Secondary | ICD-10-CM

## 2022-11-08 ENCOUNTER — Ambulatory Visit
Admission: RE | Admit: 2022-11-08 | Discharge: 2022-11-08 | Disposition: A | Discharge status: Home / Self Care | Payer: BC Managed Care – PPO | Source: Ambulatory Visit | Attending: Obstetrics & Gynecology | Admitting: Obstetrics & Gynecology

## 2022-11-08 DIAGNOSIS — Z1231 Encounter for screening mammogram for malignant neoplasm of breast: Secondary | ICD-10-CM

## 2022-11-22 ENCOUNTER — Other Ambulatory Visit (HOSPITAL_COMMUNITY)
Admission: RE | Admit: 2022-11-22 | Discharge: 2022-11-22 | Disposition: A | Discharge status: Home / Self Care | Payer: BC Managed Care – PPO | Source: Ambulatory Visit | Attending: Obstetrics & Gynecology | Admitting: Obstetrics & Gynecology

## 2022-11-22 ENCOUNTER — Encounter: Payer: Self-pay | Admitting: Obstetrics & Gynecology

## 2022-11-22 ENCOUNTER — Ambulatory Visit (INDEPENDENT_AMBULATORY_CARE_PROVIDER_SITE_OTHER): Payer: BC Managed Care – PPO | Admitting: Obstetrics & Gynecology

## 2022-11-22 VITALS — BP 140/90

## 2022-11-22 DIAGNOSIS — R87810 Cervical high risk human papillomavirus (HPV) DNA test positive: Secondary | ICD-10-CM

## 2022-11-22 DIAGNOSIS — Z01419 Encounter for gynecological examination (general) (routine) without abnormal findings: Secondary | ICD-10-CM | POA: Insufficient documentation

## 2022-11-22 NOTE — Progress Notes (Signed)
GYNECOLOGY ANNUAL PREVENTATIVE CARE ENCOUNTER NOTE  History:     Danielle Duarte is a 62 y.o. G2P2 female here for a routine annual gynecologic exam.  Current complaints: none.   Denies abnormal vaginal bleeding, discharge, pelvic pain, problems with intercourse or other gynecologic concerns.    Gynecologic History Patient's last menstrual period was 11/23/2011. Contraception: post menopausal status Last Pap: 05/10/21. Result was normal with  positive HPV (neg 16, 18/45) Last Mammogram: 11/08/22.  Result was normal Last Colonoscopy: 07/26/2020.  Result was benign  Obstetric History OB History  Gravida Para Term Preterm AB Living  2 2       2   SAB IAB Ectopic Multiple Live Births          2    # Outcome Date GA Lbr Len/2nd Weight Sex Type Anes PTL Lv  2 Para 1995        LIV  1 Para 1993        LIV    Past Medical History:  Diagnosis Date   Abnormal Pap smear    Arthritis    right knee   Dysplastic nevus 03/24/2019   spinal mid upper back, mod atypia    Fibroids    Normal cytology on  Pap smear with positive HPV test 06/12/2012   Repeat cotesting in one year    Paroxysmal supraventricular tachycardia 01/05/2014   PONV (postoperative nausea and vomiting)     Past Surgical History:  Procedure Laterality Date   APPENDECTOMY     COLONOSCOPY  10/10/2010   COLONOSCOPY WITH PROPOFOL N/A 07/26/2020   Procedure: COLONOSCOPY WITH PROPOFOL;  Surgeon: Toney Reil, MD;  Location: [facility] ENDOSCOPY;  Service: Gastroenterology;  Laterality: N/A;  Patient tested positive for COVID on 06/11/20.  She will email office a copy of these results.   COLPOSCOPY  03/01/10   HERNIA REPAIR  November 2014   LAPAROSCOPIC LYSIS OF ADHESIONS  02/28/2012   Procedure: LAPAROSCOPIC LYSIS OF ADHESIONS;  Surgeon: Tereso Newcomer, MD;  Location: [facility] ORS;  Service: Gynecology;  Laterality: N/A;   LAPAROSCOPY  02/28/2012   Procedure: LAPAROSCOPY OPERATIVE;  Surgeon: Tereso Newcomer, MD;  Location: [facility]  ORS;  Service: Gynecology;  Laterality: N/A;   SALPINGOOPHORECTOMY  02/28/2012   Procedure: SALPINGO OOPHORECTOMY;  Surgeon: Tereso Newcomer, MD;  Location: [facility] ORS;  Service: Gynecology;  Laterality: Left;   TOTAL HIP ARTHROPLASTY  04/19/15   Left   TOTAL KNEE ARTHROPLASTY Left 08/26/2018   Dr. Tresa Endo at [facility]    Current Outpatient Medications on File Prior to Visit  Medication Sig Dispense Refill   ADVAIR [facility] 115-21 MCG/ACT inhaler      azelastine (ASTELIN) 0.1 % nasal spray Place into both nostrils 2 (two) times daily. Use in each nostril as directed     Fluocinolone Acetonide 0.01 % OIL Place 1 application  in ear(s) as directed. 1-3 times per week prn 20 mL 6   levocetirizine (XYZAL) 5 MG tablet Take 5 mg by mouth every evening.     levothyroxine (SYNTHROID) 75 MCG tablet TAKE 1 TABLET BY MOUTH EVERY DAY 90 tablet 3   Multiple Vitamin (MULTIVITAMIN WITH MINERALS) TABS Take 1 tablet by mouth daily.     PROAIR HFA 108 (90 Base) MCG/ACT inhaler TAKE 2 PUFFS BY MOUTH EVERY 4 TO 6 HOURS AS NEEDED . MUST LAST 90 DAYS PER MD  0   No current facility-administered medications on file prior to visit.    Allergies  Allergen Reactions   Dust Mite Extract    Other Other (See Comments)    Dust mites   Pollen Extract     from trees and weeds    Social History:  reports that she quit smoking about 35 years ago. Her smoking use included cigarettes. She has never used smokeless tobacco. She reports that she does not drink alcohol and does not use drugs.  Family History  Problem Relation Age of Onset   Hypertension Mother    Hypertension Father    Diabetes Paternal Uncle     The following portions of the patient's history were reviewed and updated as appropriate: allergies, current medications, past family history, past medical history, past social history, past surgical history and problem list.  Review of Systems Pertinent items noted in HPI and remainder of comprehensive ROS otherwise  negative.  Physical Exam:  BP (!) 140/90   LMP 11/23/2011  CONSTITUTIONAL: Well-developed, well-nourished female in no acute distress.  HENT:  Normocephalic, atraumatic, External right and left ear normal.  EYES: Conjunctivae and EOM are normal. Pupils are equal, round, and reactive to light. No scleral icterus.  NECK: Normal range of motion, supple, no masses.  Normal thyroid.  SKIN: Skin is warm and dry. No rash noted. Not diaphoretic. No erythema. No pallor. MUSCULOSKELETAL: Normal range of motion. No tenderness.  No cyanosis, clubbing, or edema.  NEUROLOGIC: Alert and oriented to person, place, and time. Normal reflexes, muscle tone coordination.  PSYCHIATRIC: Normal mood and affect. Normal behavior. Normal judgment and thought content. CARDIOVASCULAR: Normal heart rate noted, regular rhythm RESPIRATORY: Clear to auscultation bilaterally. Effort and breath sounds normal, no problems with respiration noted. BREASTS: Symmetric in size. No masses, tenderness, skin changes, nipple drainage, or lymphadenopathy bilaterally. Performed in the presence of a chaperone. ABDOMEN: Soft, no distention noted.  No tenderness, rebound or guarding.  PELVIC: Normal appearing external genitalia and urethral meatus with moderate atrophy; atrophic vaginal mucosa and cervix.  No abnormal discharge noted.  Pap smear obtained.  Normal uterine size, no other palpable masses, no uterine or adnexal tenderness.  Performed in the presence of a chaperone.   Assessment and Plan:     1. Cervical high risk HPV (human papillomavirus) test positive on 05/10/2021 2. Well woman exam with routine gynecological exam - Cytology - PAP Will follow up results of pap smear and manage accordingly. Normal breast examination today, she was advised to perform periodic self breast examinations.  Mammogram and Colon cancer screening up to date. Routine preventative health maintenance measures emphasized. Please refer to After Visit  Summary for other counseling recommendations.      Jaynie Collins, MD, FACOG Obstetrician & Gynecologist, [facility] for [employer], [facility]

## 2022-11-27 LAB — CYTOLOGY - PAP
Comment: NEGATIVE
Diagnosis: UNDETERMINED — AB
High risk HPV: NEGATIVE

## 2022-12-08 ENCOUNTER — Other Ambulatory Visit: Payer: Self-pay | Admitting: Family Medicine

## 2022-12-08 DIAGNOSIS — E039 Hypothyroidism, unspecified: Secondary | ICD-10-CM

## 2023-01-09 ENCOUNTER — Ambulatory Visit: Payer: BC Managed Care – PPO

## 2023-03-12 IMAGING — DX DG CHEST 2V
2 series · 2 of 2 positions shown · non-contrast
Comparison: Chest x-ray 08/11/2020.

CLINICAL DATA: Productive cough.

EXAM:
CHEST - 2 VIEW

[chest pa]
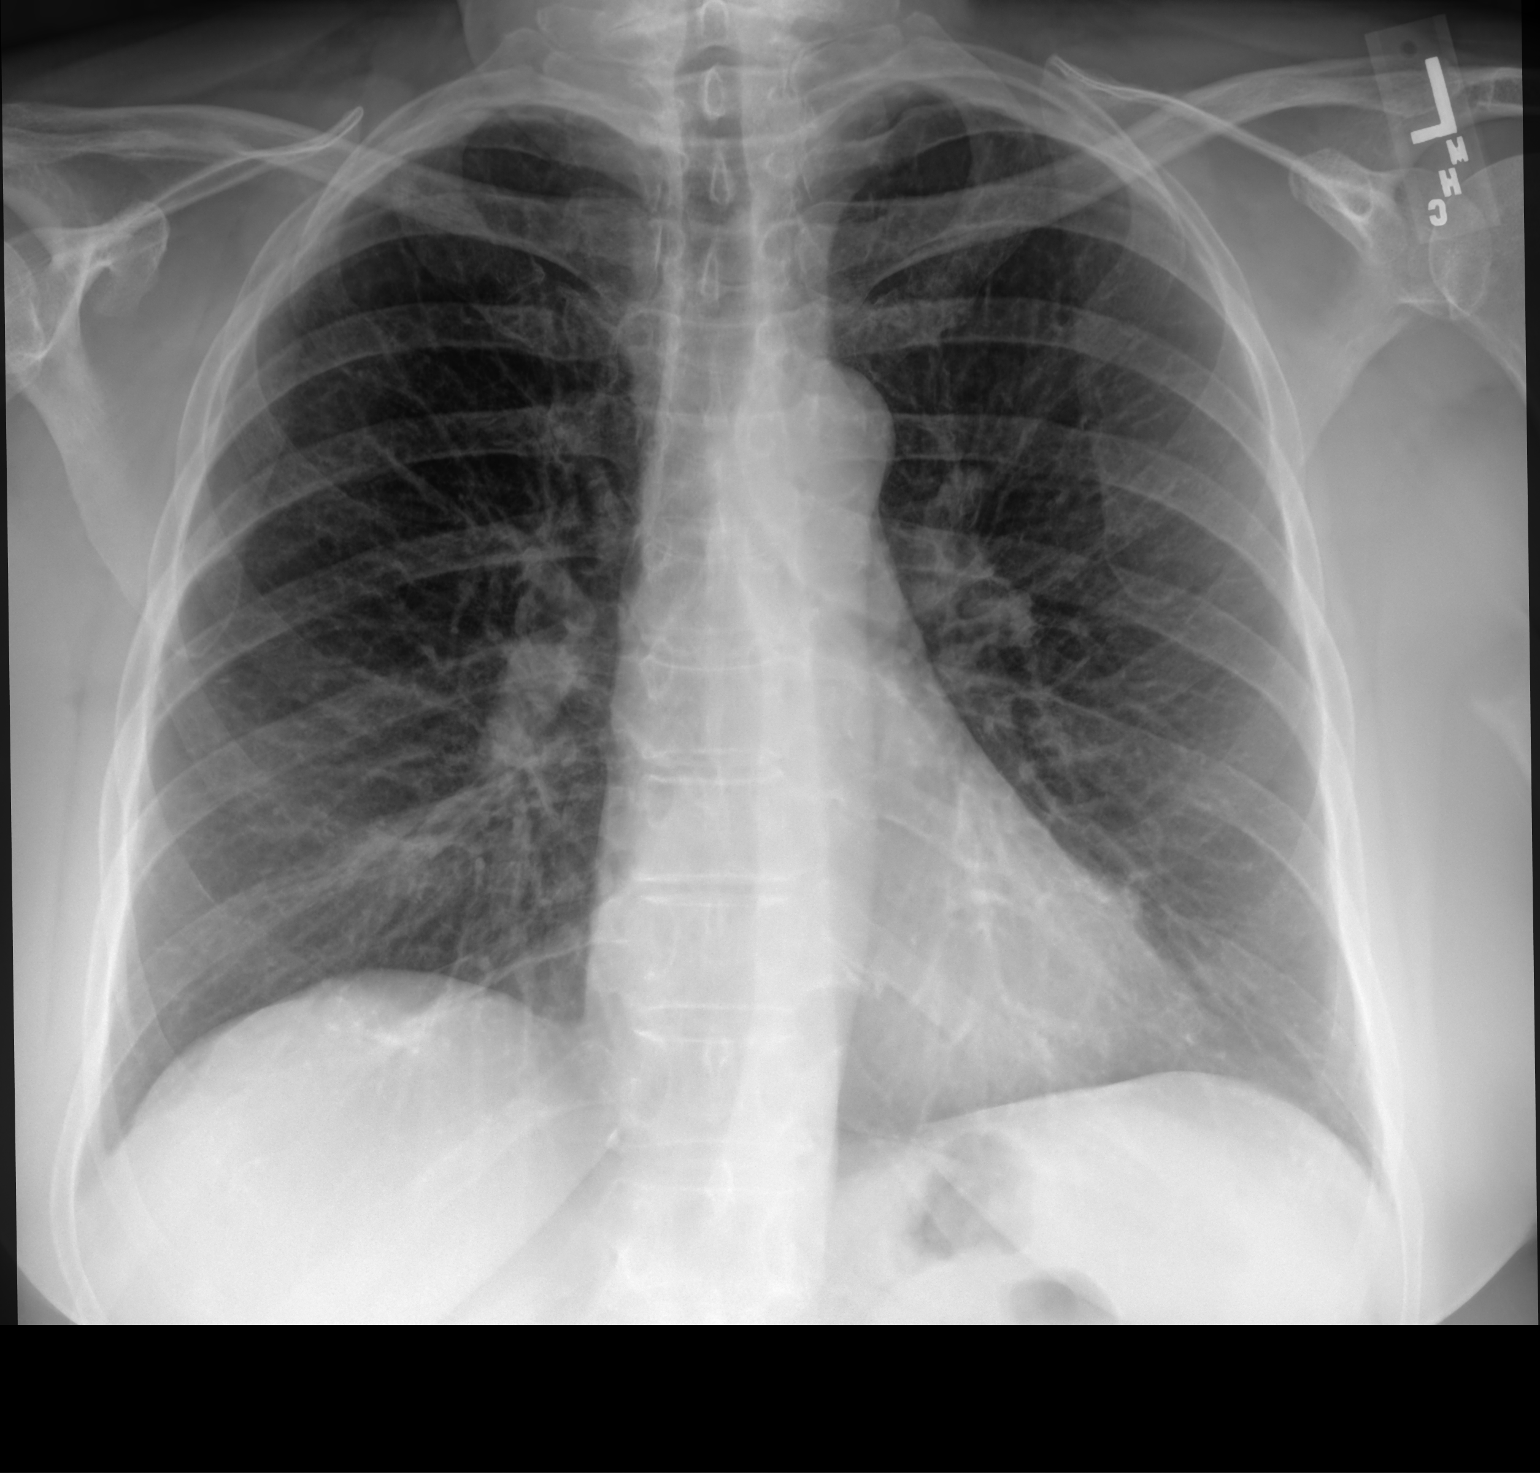

[chest lat]
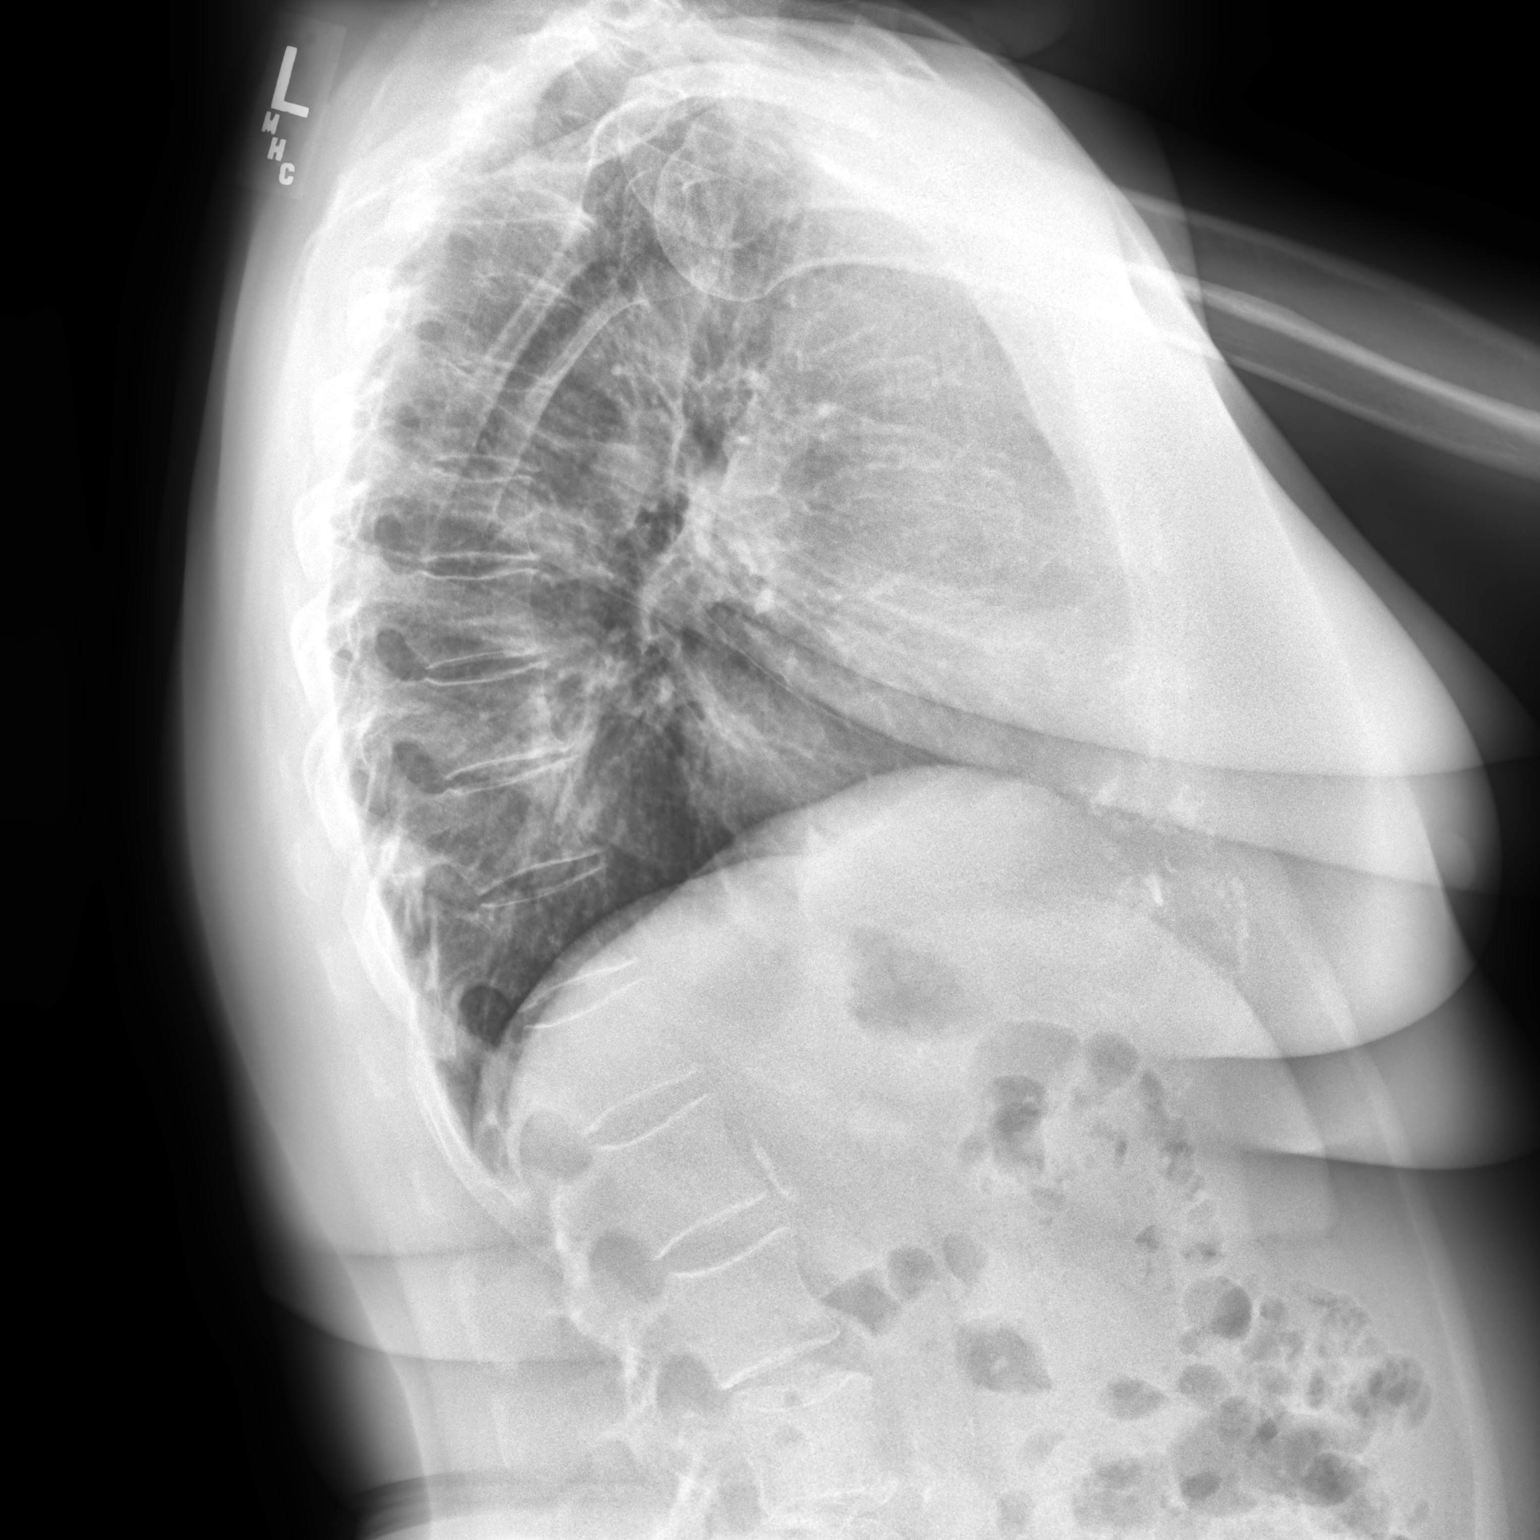

[2 of 2 positions shown; findings below may reference images not displayed]

FINDINGS: The heart size and mediastinal contours are within normal limits.
Both lungs are clear. The visualized skeletal structures are
unremarkable.
IMPRESSION: No active cardiopulmonary disease.

## 2023-05-10 ENCOUNTER — Ambulatory Visit: Payer: BC Managed Care – PPO | Admitting: Dermatology

## 2023-09-10 ENCOUNTER — Other Ambulatory Visit: Payer: Self-pay | Admitting: Obstetrics & Gynecology

## 2023-09-10 DIAGNOSIS — Z1231 Encounter for screening mammogram for malignant neoplasm of breast: Secondary | ICD-10-CM

## 2023-12-06 ENCOUNTER — Ambulatory Visit: Payer: Self-pay

## 2023-12-10 NOTE — Progress Notes (Signed)
 History of Present Illness:   Danielle Duarte is a 63 y.o. female here for   Verbally consented to the use of AI for note-taking.   Chief Complaint  Patient presents with  . Cough  . Drainage      History of Present Illness Danielle Duarte is a 63 year old female with asthma who presents with chest congestion and lethargy following a recent COVID-19 infection.  She has been experiencing chest congestion with yellow mucus production and lethargy. She describes feeling 'really down in your chest now and run down.' No blood in the mucus and no fevers have been noted.  Her asthma typically worsens in the fall. During her COVID-19 infection, she was treated with prednisone  but did not find it effective. She has previously taken Augmentin  without any issues.  She was tested for RSV, COVID, and the flu during a previous visit on the seventeenth and had symptoms a few days prior to that visit. She feels exhausted and lacks energy, noting that her friend who also contracted COVID recovered much quicker.  She has not been using Flonase  recently.  She is planning to travel to [place]  to visit her son on Thursday and is worried about her current health status affecting her travel plans.   Past Medical History:   Past Medical History:  Diagnosis Date  . AR (allergic rhinitis)   . Arthritis   . Asthma without status asthmaticus (HHS-HCC)   . Globus hystericus   . History of palpitations   . History of palpitations   . Hyperlipidemia, mixed   . Osteoarthritis of left hip   . PONV (postoperative nausea and vomiting)   . Poor intravenous access   . Primary osteoarthritis of left hip   . Primary osteoarthritis of right knee   . Thyroid disease     Past Surgical History:   Past Surgical History:  Procedure Laterality Date  . ARTHROPLASTY HIP TOTAL Left 04/19/2015   Procedure: TOTAL HIP ARTHROPLASTY;  Surgeon: Glendia Trenda Lawrence, MD;  [facility] OR;  Service: Orthopedics;   Laterality: Left;  . ARTHROPLASTY TOTAL KNEE Right 08/26/2018   Procedure: RIGHT TOTAL KNEE REPLACEMENT;  Surgeon: Lawrence Glendia Trenda, MD;  Location: [facility] OR;  Service: Orthopedics;  Laterality: Right;  . APPENDECTOMY    . HERNIA REPAIR     2014  . JOINT REPLACEMENT    . KNEE ARTHROSCOPY    . Left salpingo-oophorectomy    . Meniscal tear repair, right knee    . TONSILLECTOMY      Allergies:   Allergies  Allergen Reactions  . House Dust Mite Unknown and Other (See Comments)    American house dust mite allergenic extract  . Mite Extract Other (See Comments)  . Pollen Extracts Other (See Comments)    POLLEN EXTRACTS  . Tree And Shrub Pollen Cough    Current Medications:   Prior to Admission medications  Medication Sig Taking? Last Dose  albuterol  90 mcg/actuation inhaler Inhale 2 inhalations into the lungs every 6 (six) hours as needed for Wheezing. Yes Taking  azelastine (ASTELIN) 137 mcg nasal spray Place 1 spray into both nostrils 2 (two) times daily Yes Taking  ergocalciferol, vitamin D2, 1,250 mcg (50,000 unit) capsule Take 1 capsule (50,000 Units total) by mouth once a week for 180 days Yes Taking  FLUOCINOLONE  ACETONIDE OIL OTIC Place 1 drop in ear(s) every Monday, Wednesday, and Friday Yes Taking  fluticasone  furoate-vilanteroL (BREO ELLIPTA) 200-25 mcg/dose DsDv 1 Puff once  daily Yes Taking  fluticasone  propionate (FLONASE ) 50 mcg/actuation nasal spray Place 1 spray into both nostrils once daily Yes Taking  levocetirizine (XYZAL) 5 MG tablet TAKE 1 TABLET BY MOUTH EVERY DAY IN THE EVENING FOR 30 DAYS Yes Taking  levothyroxine  (SYNTHROID ) 75 MCG tablet Take 1 tablet (75 mcg total) by mouth once daily for 180 days Yes Taking  multivitamin tablet Take 1 tablet by mouth once daily Yes Taking  sodium, potassium, and magnesium (SUPREP) oral solution Take 1 Bottle by mouth as directed One kit contains 2 bottles.  Take both bottles at the times instructed by your provider. Yes  Taking  amoxicillin -clavulanate (AUGMENTIN ) 875-125 mg tablet Take 1 tablet (875 mg total) by mouth every 12 (twelve) hours for 7 days    predniSONE  (DELTASONE ) 10 MG tablet Take 4 tablets (40 mg total) by mouth once daily for 3 days, THEN 3 tablets (30 mg total) once daily for 3 days, THEN 2 tablets (20 mg total) once daily for 3 days, THEN 1 tablet (10 mg total) once daily for 3 days.      Family History:   Family History  Problem Relation Name Age of Onset  . High blood pressure (Hypertension) Mother Arlyne   . Stroke Father Dempsey   . Myocardial Infarction (Heart attack) Father Dempsey   . High blood pressure (Hypertension) Father Dempsey   . Skin cancer Other      Social History:   Social History   Socioeconomic History  . Marital status: Single  Tobacco Use  . Smoking status: Former    Current packs/day: 0.00    Types: Cigarettes    Start date: 03/13/1986    Quit date: 03/13/1986    Years since quitting: 37.7  . Smokeless tobacco: Never  Vaping Use  . Vaping status: Never Used  Substance and Sexual Activity  . Alcohol use: Not Currently    Comment: none  . Drug use: No  . Sexual activity: Not Currently   Social Drivers of Health   Financial Resource Strain: Low Risk  (08/30/2023)   Overall Financial Resource Strain (CARDIA)   . Difficulty of Paying Living Expenses: Not hard at all  Food Insecurity: No Food Insecurity (08/30/2023)   Hunger Vital Sign   . Worried About Programme researcher, broadcasting/film/video in the Last Year: Never true   . Ran Out of Food in the Last Year: Never true  Transportation Needs: No Transportation Needs (08/30/2023)   PRAPARE - Transportation   . Lack of Transportation (Medical): No   . Lack of Transportation (Non-Medical): No  Housing Stability: Low Risk  (08/30/2023)   Housing Stability Vital Sign   . Unable to Pay for Housing in the Last Year: No   . Number of Times Moved in the Last Year: 0   . Homeless in the Last Year: No    Review of Systems:   A  10 point review of systems is negative, except for the pertinent positives and negatives detailed in the HPI.  Vitals:   Vitals:   12/10/23 1203  BP: 130/78  Pulse: 92  Temp: 36.9 C (98.5 F)  TempSrc: Oral  SpO2: 96%  Weight: 98.9 kg (218 lb)     Body mass index is 37.42 kg/m.  Physical Exam:   Physical Exam Vitals and nursing note reviewed.  Constitutional:      General: She is not in acute distress.    Appearance: Normal appearance. She is not ill-appearing, toxic-appearing or diaphoretic.  HENT:  Head: Normocephalic and atraumatic.     Right Ear: External ear normal.     Left Ear: External ear normal.     Nose: No rhinorrhea.     Mouth/Throat:     Pharynx: Posterior oropharyngeal erythema present.  Eyes:     Conjunctiva/sclera: Conjunctivae normal.  Cardiovascular:     Rate and Rhythm: Normal rate and regular rhythm.     Pulses: Normal pulses.     Heart sounds: Normal heart sounds.  Pulmonary:     Effort: Pulmonary effort is normal.     Comments: Coarse lung sounds noted on the left lower lung Neurological:     Mental Status: She is alert.     Assessment and Plan:  No results found for this visit on 12/10/23.   Assessment & Plan Acute bronchitis after recent COVID-19 infection in a patient with asthma Suspected secondary bacterial bronchitis, not pneumonia. Asthma may exacerbate symptoms. - Prescribe Augmentin  for 7 days. - Initiate 12-day prednisone  taper. - Monitor symptoms; report if no improvement by day 3 of antibiotics. - Recommend rest and fluid intake. - Delay flu vaccination until 3-4 weeks post-symptom resolution. - Discuss with allergist regarding infection status and reschedule if needed. - Advise mask use until symptoms improve.  Asthma Asthma may prolong recovery and increase fatigue post-COVID-19. Prednisone  to prevent exacerbation. - Continue current asthma management. - Start prednisone  taper for 12 days - Restart Flonase  after  prednisone  taper.  Disposition: Follow-up as needed  There are no Patient Instructions on file for this visit.   This note has been created using automated tools and reviewed for accuracy by provider.  Patient received an After Visit Summary    Attestation Statement:   I personally performed the service, non-incident to. (WP)   MASON MCCLELLAND MINOR, PA

## 2023-12-20 ENCOUNTER — Encounter: Payer: Self-pay | Admitting: Obstetrics & Gynecology

## 2023-12-20 ENCOUNTER — Ambulatory Visit (INDEPENDENT_AMBULATORY_CARE_PROVIDER_SITE_OTHER): Admitting: Obstetrics & Gynecology

## 2023-12-20 ENCOUNTER — Other Ambulatory Visit (HOSPITAL_COMMUNITY)
Admission: RE | Admit: 2023-12-20 | Discharge: 2023-12-20 | Disposition: A | Source: Ambulatory Visit | Attending: Obstetrics & Gynecology | Admitting: Obstetrics & Gynecology

## 2023-12-20 VITALS — BP 137/80 | HR 90 | Ht 62.0 in | Wt 224.0 lb

## 2023-12-20 DIAGNOSIS — R390 Extravasation of urine: Secondary | ICD-10-CM | POA: Diagnosis not present

## 2023-12-20 DIAGNOSIS — Z01411 Encounter for gynecological examination (general) (routine) with abnormal findings: Secondary | ICD-10-CM | POA: Diagnosis present

## 2023-12-20 DIAGNOSIS — R87619 Unspecified abnormal cytological findings in specimens from cervix uteri: Secondary | ICD-10-CM | POA: Diagnosis not present

## 2023-12-20 DIAGNOSIS — Z1151 Encounter for screening for human papillomavirus (HPV): Secondary | ICD-10-CM | POA: Insufficient documentation

## 2023-12-20 DIAGNOSIS — N952 Postmenopausal atrophic vaginitis: Secondary | ICD-10-CM

## 2023-12-20 DIAGNOSIS — Z01419 Encounter for gynecological examination (general) (routine) without abnormal findings: Secondary | ICD-10-CM | POA: Insufficient documentation

## 2023-12-20 MED ORDER — ESTRADIOL 10 MCG VA TABS
ORAL_TABLET | VAGINAL | 12 refills | Status: AC
Start: 2023-12-20 — End: ?

## 2023-12-20 NOTE — Progress Notes (Signed)
 GYNECOLOGY ANNUAL PREVENTATIVE CARE ENCOUNTER NOTE  History:    Danielle Duarte is a 63 y.o. G2P2 female here for a routine annual gynecologic exam.  Current complaints: leakage of urine when she coughs for about two years now, has to wear pads.  Wants to know what she can do about this. No other urinary symptoms.   Denies abnormal vaginal bleeding, discharge, pelvic pain, problems with intercourse or other gynecologic concerns.  Gynecologic History Patient's last menstrual period was 11/23/2011. Contraception: post menopausal status Last Pap: 11/22/22. Result was ASCUS with negative HPV.  In 05/10/21, had normal pap with positive HPV (neg 16, 18/45)  Last Mammogram: 11/08/22.  Result was normal Last Colonoscopy: 07/26/2020.  Result was benign  Obstetric History OB History  Gravida Para Term Preterm AB Living  2 2    2   SAB IAB Ectopic Multiple Live Births      2    # Outcome Date GA Lbr Len/2nd Weight Sex Type Anes PTL Lv  2 Para 1995        LIV  1 Para 1993        LIV    Past Medical History:  Diagnosis Date   Abnormal Pap smear    Arthritis    right knee   Dysplastic nevus 03/24/2019   spinal mid upper back, mod atypia    Fibroids    Normal cytology on  Pap smear with positive HPV test 06/12/2012   Repeat cotesting in one year    Paroxysmal supraventricular tachycardia 01/05/2014   PONV (postoperative nausea and vomiting)     Past Surgical History:  Procedure Laterality Date   APPENDECTOMY     COLONOSCOPY  10/10/2010   COLONOSCOPY WITH PROPOFOL  N/A 07/26/2020   Procedure: COLONOSCOPY WITH PROPOFOL ;  Surgeon: Unk [name], MD;  Location: [facility];  Service: Gastroenterology;  Laterality: N/A;  Patient tested positive for COVID on 06/11/20.  She will email office a copy of these results.   COLPOSCOPY  03/01/10   HERNIA REPAIR  November 2014   LAPAROSCOPIC LYSIS OF ADHESIONS  02/28/2012   Procedure: LAPAROSCOPIC LYSIS OF ADHESIONS;  Surgeon: [name],  MD;  Location: [facility];  Service: Gynecology;  Laterality: N/A;   LAPAROSCOPY  02/28/2012   Procedure: LAPAROSCOPY OPERATIVE;  Surgeon: [name], MD;  Location: [facility];  Service: Gynecology;  Laterality: N/A;   SALPINGOOPHORECTOMY  02/28/2012   Procedure: SALPINGO OOPHORECTOMY;  Surgeon: [name], MD;  Location: [facility];  Service: Gynecology;  Laterality: Left;   TOTAL HIP ARTHROPLASTY  04/19/15   Left   TOTAL KNEE ARTHROPLASTY Left 08/26/2018   Dr. Burnard at [facility]    Current Outpatient Medications on File Prior to Visit  Medication Sig Dispense Refill   ADVAIR [facility] 115-21 MCG/ACT inhaler      azelastine (ASTELIN) 0.1 % nasal spray Place into both nostrils 2 (two) times daily. Use in each nostril as directed     Fluocinolone  Acetonide 0.01 % OIL Place 1 application  in ear(s) as directed. 1-3 times per week prn 20 mL 6   levocetirizine (XYZAL) 5 MG tablet Take 5 mg by mouth every evening.     levothyroxine  (SYNTHROID ) 75 MCG tablet TAKE 1 TABLET BY MOUTH EVERY DAY 90 tablet 3   Multiple Vitamin (MULTIVITAMIN WITH MINERALS) TABS Take 1 tablet by mouth daily.     PROAIR  HFA 108 (90 Base) MCG/ACT inhaler TAKE 2 PUFFS BY MOUTH EVERY 4 TO 6 HOURS  AS NEEDED . MUST LAST 90 DAYS PER MD  0   No current facility-administered medications on file prior to visit.    Allergies  Allergen Reactions   Dust Mite Extract    Other Other (See Comments)    Dust mites   Pollen Extract     from trees and weeds    Social History:  reports that she quit smoking about 36 years ago. Her smoking use included cigarettes. She has never used smokeless tobacco. She reports that she does not drink alcohol and does not use drugs.  Family History  Problem Relation Age of Onset   Hypertension Mother    Hypertension Father    Diabetes Paternal Uncle     The following portions of the patient's history were reviewed and updated as appropriate: allergies, current medications, past family history, past  medical history, past social history, past surgical history and problem list.  Review of Systems Pertinent items noted in HPI and remainder of comprehensive ROS otherwise negative.  Physical Exam:  BP 137/80   Pulse 90   Ht 5' 2 (1.575 m)   Wt 224 lb (101.6 kg)   LMP 11/23/2011   BMI 40.97 kg/m  CONSTITUTIONAL: Well-developed, well-nourished female in no acute distress.  HENT:  Normocephalic, atraumatic, External right and left ear normal.  EYES: Conjunctivae and EOM are normal. Pupils are equal, round, and reactive to light. No scleral icterus.  NECK: Normal range of motion, supple, no masses observed. SKIN: Skin is warm and dry. No rash noted. Not diaphoretic. No erythema. No pallor. MUSCULOSKELETAL: Normal range of motion. No tenderness.  No cyanosis, clubbing, or edema. NEUROLOGIC: Alert and oriented to person, place, and time. Normal muscle tone coordination.  PSYCHIATRIC: Normal mood and affect. Normal behavior. Normal judgment and thought content. CARDIOVASCULAR: Normal heart rate noted, regular rhythm RESPIRATORY: Clear to auscultation bilaterally. Effort and breath sounds normal, no problems with respiration noted. BREASTS: Symmetric in size. No masses, tenderness, skin changes, nipple drainage, or lymphadenopathy bilaterally. Performed in the presence of a chaperone. ABDOMEN: Soft, no distention noted.  No tenderness, rebound or guarding.  PELVIC: Normal appearing external genitalia and urethral meatus with moderate atrophy; atrophic vaginal mucosa and cervix. Grade 1 cystocele and rectocele noted.  No abnormal discharge noted. Pap smear obtained. Normal uterine size, no other palpable masses, no uterine or adnexal tenderness. Performed in the presence of a chaperone   Assessment and Plan:     1. Leakage of urine while coughing 2. Vaginal atrophy This is stress incontinence, discussed with patient.  Recommended referral to Urogynecology/Physical Pelvic Therapy, she wants to  wait on this for now. Given her menopausal vaginal atrophy, counseled about vaginal estrogen therapy, this could help in strengthening her vagina and support under the bladder, and help alleviate symptoms.  Weight loss also recommended.   - Estradiol 10 MCG TABS vaginal tablet; One tablet placed vaginally every day for one week, then do this twice a week for maintenance  Dispense: 24 tablet; Refill: 12  3. Abnormal cervical Papanicolaou smear, unspecified abnormal pap finding 4. Well woman exam with routine gynecological exam (Primary) - Cytology - PAP Will follow up results of pap smear and manage accordingly. Normal breast examination today, she was advised to perform periodic self breast examinations.  Mammogram already scheduled for breast cancer screening. Colon cancer screening also already scheduled; DEXA scan already done. Routine preventative health maintenance measures emphasized. Please refer to After Visit Summary for other counseling recommendations.  GLORIS HUGGER, MD, FACOG Obstetrician & Gynecologist, [facility] for [employer], [facility]

## 2023-12-25 ENCOUNTER — Encounter: Payer: Self-pay | Admitting: Obstetrics & Gynecology

## 2023-12-25 DIAGNOSIS — R390 Extravasation of urine: Secondary | ICD-10-CM

## 2023-12-25 LAB — CYTOLOGY - PAP
Comment: NEGATIVE
Diagnosis: NEGATIVE
High risk HPV: NEGATIVE

## 2023-12-26 ENCOUNTER — Ambulatory Visit: Payer: Self-pay

## 2023-12-26 ENCOUNTER — Ambulatory Visit: Payer: Self-pay | Admitting: Obstetrics & Gynecology

## 2024-01-09 ENCOUNTER — Ambulatory Visit
Admission: RE | Admit: 2024-01-09 | Discharge: 2024-01-09 | Disposition: A | Discharge status: Home / Self Care | Payer: Self-pay | Source: Ambulatory Visit | Attending: Obstetrics & Gynecology | Admitting: Obstetrics & Gynecology

## 2024-01-09 DIAGNOSIS — Z1231 Encounter for screening mammogram for malignant neoplasm of breast: Secondary | ICD-10-CM

## 2024-01-14 ENCOUNTER — Telehealth: Payer: Self-pay | Admitting: Obstetrics & Gynecology

## 2024-01-14 ENCOUNTER — Ambulatory Visit: Payer: Self-pay | Admitting: Obstetrics & Gynecology

## 2024-01-14 ENCOUNTER — Telehealth: Payer: Self-pay

## 2024-01-14 NOTE — Telephone Encounter (Signed)
 Pt is calling in stating that she has not heard anything from the Uro-GYN office that Any[facility] had referred her to and would like to see if we could check on the referral status and get back with her.  Pt declined to receive the number for Uro-GYN and wanted us  to check on it.

## 2024-01-14 NOTE — Telephone Encounter (Signed)
 TC to Urogyn regarding status of pt  referral no anser lvm.

## 2024-01-15 ENCOUNTER — Encounter: Payer: Self-pay | Admitting: Gastroenterology

## 2024-01-16 ENCOUNTER — Ambulatory Visit: Admitting: Obstetrics and Gynecology

## 2024-01-16 ENCOUNTER — Encounter: Payer: Self-pay | Admitting: Obstetrics and Gynecology

## 2024-01-16 ENCOUNTER — Other Ambulatory Visit (HOSPITAL_COMMUNITY)
Admission: RE | Admit: 2024-01-16 | Discharge: 2024-01-16 | Disposition: A | Discharge status: Home / Self Care | Source: Other Acute Inpatient Hospital | Attending: Obstetrics and Gynecology | Admitting: Obstetrics and Gynecology

## 2024-01-16 VITALS — BP 156/94 | HR 65 | Ht 62.0 in | Wt 216.8 lb

## 2024-01-16 DIAGNOSIS — R8290 Unspecified abnormal findings in urine: Secondary | ICD-10-CM

## 2024-01-16 DIAGNOSIS — R319 Hematuria, unspecified: Secondary | ICD-10-CM | POA: Insufficient documentation

## 2024-01-16 DIAGNOSIS — N393 Stress incontinence (female) (male): Secondary | ICD-10-CM | POA: Insufficient documentation

## 2024-01-16 DIAGNOSIS — N3281 Overactive bladder: Secondary | ICD-10-CM | POA: Diagnosis not present

## 2024-01-16 LAB — URINALYSIS, COMPLETE (UACMP) WITH MICROSCOPIC
Bacteria, UA: NONE SEEN
Bilirubin Urine: NEGATIVE
Glucose, UA: NEGATIVE mg/dL
Hgb urine dipstick: NEGATIVE
Ketones, ur: NEGATIVE mg/dL
Leukocytes,Ua: NEGATIVE
Nitrite: NEGATIVE
Protein, ur: NEGATIVE mg/dL
Specific Gravity, Urine: 1.012 (ref 1.005–1.030)
pH: 5 (ref 5.0–8.0)

## 2024-01-16 LAB — POCT URINALYSIS DIP (CLINITEK)
Bilirubin, UA: NEGATIVE
Glucose, UA: NEGATIVE mg/dL
Ketones, POC UA: NEGATIVE mg/dL
Leukocytes, UA: NEGATIVE
Nitrite, UA: NEGATIVE
POC PROTEIN,UA: NEGATIVE
Spec Grav, UA: 1.015 (ref 1.010–1.025)
Urobilinogen, UA: 0.2 U/dL
pH, UA: 5.5 (ref 5.0–8.0)

## 2024-01-16 MED ORDER — TROSPIUM CHLORIDE ER 60 MG PO CP24: 1.0000 | ORAL_CAPSULE | Freq: Every day | ORAL | 5 refills | Status: AC

## 2024-01-16 NOTE — Progress Notes (Signed)
 New Patient Evaluation and Consultation  Referring Provider: [name], MD PCP: [name], MD Date of Service: 01/16/2024  SUBJECTIVE Chief Complaint: New Patient (Initial Visit) (Danielle Duarte is a 63 y.o. female here today for urinary incontinence )  History of Present Illness: Danielle Duarte is a 63 y.o. White or Caucasian female presenting for evaluation of incontinence.     Urinary Symptoms: Leaks urine with cough/ sneeze, with movement to the bathroom, and with urgency, bending Leaks several time(s) per day. Worse recently because she has an URI. SUI >> UUI. But she does have urgency, just usually does not leak.  Pad use: several pads per day.   Patient is bothered by UI symptoms. Has not had prior treatment for incontinence  Day time voids 10.  Nocturia: 3 times per night to void. Voiding dysfunction:  empties bladder well.  Patient does not use a catheter to empty bladder.  When urinating, patient feels she has no difficulties Drinks: 4-5 glasses water per day, occasional 8oz sweet tea or soda, rare coffee or herbal tea  UTIs: 0 UTI's in the last year.   Denies history of blood in urine and kidney or bladder stones. Has remote history of pyelonephritis   Pelvic Organ Prolapse Symptoms:                  Patient Denies a feeling of a bulge the vaginal area.   Bowel Symptom: Bowel movements: 2 time(s) per day Stool consistency: soft  Straining: no.  Splinting: no.  Incomplete evacuation: no.  Patient Denies accidental bowel leakage / fecal incontinence Bowel regimen: none   Sexual Function Sexually active: no.  Sexual orientation: hetero Pain with sex: No  Pelvic Pain Denies pelvic pain   Past Medical History:  Past Medical History:  Diagnosis Date   Abnormal Pap smear    Arthritis    right knee   Asthma    Dysplastic nevus 03/24/2019   spinal mid upper back, mod atypia    Fibroids    Hypothyroidism    Normal cytology on  Pap smear  with positive HPV test 06/12/2012   Repeat cotesting in one year    Paroxysmal supraventricular tachycardia 01/05/2014   PONV (postoperative nausea and vomiting)      Past Surgical History:   Past Surgical History:  Procedure Laterality Date   APPENDECTOMY     COLONOSCOPY  10/10/2010   COLONOSCOPY WITH PROPOFOL  N/A 07/26/2020   Procedure: COLONOSCOPY WITH PROPOFOL ;  Surgeon: Unk [name], MD;  Location: [facility];  Service: Gastroenterology;  Laterality: N/A;  Patient tested positive for COVID on 06/11/20.  She will email office a copy of these results.   COLPOSCOPY  03/01/2010   HERNIA REPAIR  01/2013   right abdominal wall, with mesh   LAPAROSCOPIC LYSIS OF ADHESIONS  02/28/2012   Procedure: LAPAROSCOPIC LYSIS OF ADHESIONS;  Surgeon: [name], MD;  Location: [facility];  Service: Gynecology;  Laterality: N/A;   LAPAROSCOPY  02/28/2012   Procedure: LAPAROSCOPY OPERATIVE;  Surgeon: [name], MD;  Location: [facility];  Service: Gynecology;  Laterality: N/A;   SALPINGOOPHORECTOMY  02/28/2012   Procedure: SALPINGO OOPHORECTOMY;  Surgeon: [name], MD;  Location: [facility];  Service: Gynecology;  Laterality: Left;   TOTAL HIP ARTHROPLASTY  04/19/2015   Left   TOTAL KNEE ARTHROPLASTY Left 08/26/2018   Dr. Burnard at [facility]     Past OB/GYN History: OB History  Gravida Para Term Preterm AB Living  2 2 2   2   SAB IAB Ectopic Multiple Live Births      2    # Outcome Date GA Lbr Len/2nd Weight Sex Type Anes PTL Lv  2 Term 60    F Vag-Spont   LIV  1 Term 49    M Vag-Spont   LIV    Menopausal: Denies vaginal bleeding since menopause     Component Value Date/Time   DIAGPAP  12/20/2023 0846    - Negative for intraepithelial lesion or malignancy (NILM)   DIAGPAP (A) 11/22/2022 1539    - Atypical squamous cells of undetermined significance (ASC-US )   DIAGPAP  05/10/2021 1531    - Negative for intraepithelial lesion or malignancy (NILM)   HPVHIGH Negative  12/20/2023 0846   HPVHIGH Negative 11/22/2022 1539   HPVHIGH Positive (A) 05/10/2021 1531   ADEQPAP  12/20/2023 0846    Satisfactory for evaluation; transformation zone component PRESENT.   ADEQPAP  11/22/2022 1539    Satisfactory for evaluation; transformation zone component PRESENT.   ADEQPAP  05/10/2021 1531    Satisfactory for evaluation; transformation zone component PRESENT.    Medications: Patient has a current medication list which includes the following prescription(s): azelastine, estradiol, fluocinolone  acetonide, fluticasone  furoate-vilanterol, levothyroxine , multivitamin with minerals, proair  hfa, and trospium chloride.   Allergies: Patient is allergic to dust mite extract, other, and pollen extract.   Social History:  Social History   Tobacco Use   Smoking status: Former    Current packs/day: 0.00    Types: Cigarettes    Quit date: 03/13/1987    Years since quitting: 36.8   Smokeless tobacco: Never  Vaping Use   Vaping status: Never Used  Substance Use Topics   Alcohol use: No   Drug use: No    Relationship status: divorced Patient lives alone.   Patient is employed- production manager. Regular exercise: Yes: walking History of abuse: No  Family History:   Family History  Problem Relation Age of Onset   Hypertension Mother    Hypertension Father    Diabetes Paternal Uncle    Bladder Cancer Maternal Uncle    Breast cancer Neg Hx    Uterine cancer Neg Hx    Renal cancer Neg Hx      Review of Systems: Review of Systems  Constitutional:  Negative for fever, malaise/fatigue and weight loss.  Respiratory:  Negative for cough, shortness of breath and wheezing.   Cardiovascular:  Negative for chest pain, palpitations and leg swelling.  Gastrointestinal:  Negative for abdominal pain and blood in stool.  Genitourinary:  Negative for dysuria.  Musculoskeletal:  Negative for myalgias.  Skin:  Negative for rash.  Neurological:  Negative for dizziness and  headaches.  Endo/Heme/Allergies:  Bruises/bleeds easily.  Psychiatric/Behavioral:  Negative for depression. The patient is not nervous/anxious.      OBJECTIVE Physical Exam: Vitals:   01/16/24 1112  BP: (!) 156/94  Pulse: 65  Weight: 216 lb 12.8 oz (98.3 kg)  Height: 5' 2 (1.575 m)    Physical Exam Vitals reviewed. Exam conducted with a chaperone present.  Constitutional:      General: She is not in acute distress. Pulmonary:     Effort: Pulmonary effort is normal.  Abdominal:     General: There is no distension.     Palpations: Abdomen is soft.     Tenderness: There is no abdominal tenderness. There is no rebound.   Musculoskeletal:        General: No swelling.  Normal range of motion.  Skin:    General: Skin is warm and dry.     Findings: No rash.  Neurological:     Mental Status: She is alert and oriented to person, place, and time.  Psychiatric:        Mood and Affect: Mood normal.        Behavior: Behavior normal.      GU / Detailed Urogynecologic Evaluation:  Pelvic Exam: Normal external female genitalia; Bartholin's and Skene's glands normal in appearance; urethral meatus normal in appearance, no urethral masses or discharge.   CST: negative  Speculum exam reveals normal vaginal mucosa with atrophy. Cervix normal appearance. Uterus normal single, nontender. Adnexa no mass, fullness, tenderness.      Pelvic floor strength I/V  Pelvic floor musculature: Right levator non-tender, Right obturator non-tender, Left levator non-tender, Left obturator non-tender  POP-Q:   POP-Q  -3                                            Aa   -3                                           Ba  -8                                              C   2                                            Gh  4                                            Pb  9                                            tvl   -3                                            Ap  -3                                             Bp  -9                                              D      Rectal Exam:  deferred  Post-Void Residual (PVR) by Bladder Scan: In order to evaluate bladder emptying, we discussed obtaining a  postvoid residual and patient agreed to this procedure.  Procedure: The ultrasound unit was placed on the patient's abdomen in the suprapubic region after the patient had voided.    Post Void Residual - 01/16/24 1119       Post Void Residual   Post Void Residual 44 mL           Laboratory Results: Lab Results  Component Value Date   COLORU yellow 01/16/2024   CLARITYU clear 01/16/2024   GLUCOSEUR negative 01/16/2024   BILIRUBINUR negative 01/16/2024   KETONESU Negative 12/14/2020   SPECGRAV 1.015 01/16/2024   RBCUR trace-intact (A) 01/16/2024   PHUR 5.5 01/16/2024   PROTEINUR Negative 12/14/2020   UROBILINOGEN 0.2 01/16/2024   LEUKOCYTESUR Negative 01/16/2024    Lab Results  Component Value Date   CREATININE 0.74 07/06/2021   CREATININE 0.79 07/02/2020   CREATININE 0.76 05/31/2020    No results found for: HGBA1C  Lab Results  Component Value Date   HGB 14.3 07/06/2021     ASSESSMENT AND PLAN Ms. Acocella is a 63 y.o. with:  1. SUI (stress urinary incontinence, female)   2. Overactive bladder   3. Abnormal urinalysis   4. Hematuria, unspecified type     SUI (stress urinary incontinence, female) Assessment & Plan: - For treatment of stress urinary incontinence,  non-surgical options include expectant management, weight loss, physical therapy, as well as a pessary.  Surgical options include a midurethral sling, and transurethral injection of a bulking agent. - She is interested in pelvic PT, referral placed to [facility] rehab - She may be interested in a pessary in the future  Orders: -     AMB referral to rehabilitation -     POCT URINALYSIS DIP (CLINITEK)  Overactive bladder Assessment & Plan: - We discussed the symptoms of overactive  bladder (OAB), which include urinary urgency, urinary frequency, nocturia, with or without urge incontinence.  While we do not know the exact etiology of OAB, several treatment options exist. We discussed management including behavioral therapy (decreasing bladder irritants, urge suppression strategies, timed voids, bladder retraining), physical therapy, medication. - Prescribed trospium 60mg  ER. For anticholinergic medications, we discussed the potential side effects of anticholinergics including dry eyes, dry mouth, constipation, cognitive impairment and urinary retention.   Orders: -     Trospium Chloride ER; Take 1 capsule (60 mg total) by mouth daily.  Dispense: 30 capsule; Refill: 5 -     POCT URINALYSIS DIP (CLINITEK)  Abnormal urinalysis -     Urine Culture; Future  Hematuria, unspecified type -     Urinalysis, Complete w Microscopic; Future  Return 6 weeks   Rosaline LOISE Caper, MD

## 2024-01-16 NOTE — Assessment & Plan Note (Signed)
-   We discussed the symptoms of overactive bladder (OAB), which include urinary urgency, urinary frequency, nocturia, with or without urge incontinence.  While we do not know the exact etiology of OAB, several treatment options exist. We discussed management including behavioral therapy (decreasing bladder irritants, urge suppression strategies, timed voids, bladder retraining), physical therapy, medication. - Prescribed trospium 60mg  ER. For anticholinergic medications, we discussed the potential side effects of anticholinergics including dry eyes, dry mouth, constipation, cognitive impairment and urinary retention.

## 2024-01-16 NOTE — Assessment & Plan Note (Signed)
-   For treatment of stress urinary incontinence,  non-surgical options include expectant management, weight loss, physical therapy, as well as a pessary.  Surgical options include a midurethral sling, and transurethral injection of a bulking agent. - She is interested in pelvic PT, referral placed to [facility] rehab - She may be interested in a pessary in the future

## 2024-01-16 NOTE — Patient Instructions (Addendum)
For treatment of stress urinary incontinence, which is leakage with physical activity/movement/strainging/coughing, we discussed expectant management versus nonsurgical options versus surgery. Nonsurgical options include weight loss, physical therapy, as well as a pessary.  Surgical options include a midurethral sling, which is a synthetic mesh sling that acts like a hammock under the urethra to prevent leakage of urine, and transurethral injection of a bulking agent.  

## 2024-01-17 LAB — URINE CULTURE: Culture: <10000 — AB

## 2024-01-21 ENCOUNTER — Ambulatory Visit
Admission: RE | Admit: 2024-01-21 | Discharge: 2024-01-21 | Disposition: A | Discharge status: Home / Self Care | Attending: Gastroenterology | Admitting: Gastroenterology

## 2024-01-21 ENCOUNTER — Encounter: Payer: Self-pay | Admitting: Gastroenterology

## 2024-01-21 ENCOUNTER — Other Ambulatory Visit: Payer: Self-pay

## 2024-01-21 ENCOUNTER — Ambulatory Visit: Payer: Self-pay | Admitting: Anesthesiology

## 2024-01-21 ENCOUNTER — Encounter
Admission: RE | Disposition: A | Discharge status: Home / Self Care | Payer: Self-pay | Source: Home / Self Care | Attending: Gastroenterology

## 2024-01-21 DIAGNOSIS — Z87891 Personal history of nicotine dependence: Secondary | ICD-10-CM | POA: Diagnosis not present

## 2024-01-21 DIAGNOSIS — Z1211 Encounter for screening for malignant neoplasm of colon: Secondary | ICD-10-CM | POA: Insufficient documentation

## 2024-01-21 DIAGNOSIS — K5730 Diverticulosis of large intestine without perforation or abscess without bleeding: Secondary | ICD-10-CM | POA: Insufficient documentation

## 2024-01-21 DIAGNOSIS — K648 Other hemorrhoids: Secondary | ICD-10-CM | POA: Insufficient documentation

## 2024-01-21 DIAGNOSIS — Z860101 Personal history of adenomatous and serrated colon polyps: Secondary | ICD-10-CM | POA: Insufficient documentation

## 2024-01-21 DIAGNOSIS — E039 Hypothyroidism, unspecified: Secondary | ICD-10-CM | POA: Insufficient documentation

## 2024-01-21 DIAGNOSIS — J45909 Unspecified asthma, uncomplicated: Secondary | ICD-10-CM | POA: Insufficient documentation

## 2024-01-21 DIAGNOSIS — K644 Residual hemorrhoidal skin tags: Secondary | ICD-10-CM | POA: Insufficient documentation

## 2024-01-21 HISTORY — DX: Chronic cough: R05.3

## 2024-01-21 HISTORY — DX: Hypothyroidism, unspecified: E03.9

## 2024-01-21 HISTORY — DX: Allergic rhinitis, unspecified: J30.9

## 2024-01-21 HISTORY — DX: Unspecified asthma, uncomplicated: J45.909

## 2024-01-21 HISTORY — PX: COLONOSCOPY: SHX5424

## 2024-01-21 SURGERY — COLONOSCOPY
Anesthesia: General

## 2024-01-21 MED ORDER — PROPOFOL 10 MG/ML IV BOLUS
INTRAVENOUS | Status: DC | PRN
  Administered 2024-01-21: 100 mg via INTRAVENOUS
  Administered 2024-01-21: 120 ug/kg/min via INTRAVENOUS

## 2024-01-21 MED ORDER — LACTATED RINGERS IV SOLN: INTRAVENOUS | Status: DC

## 2024-01-21 MED ORDER — STERILE WATER FOR IRRIGATION IR SOLN
Status: DC | PRN
  Administered 2024-01-21: 1

## 2024-01-21 MED ORDER — PROPOFOL 1000 MG/100ML IV EMUL
INTRAVENOUS | Status: AC
  Filled 2024-01-21: qty 100

## 2024-01-21 SURGICAL SUPPLY — 4 items
GOWN CVR UNV OPN BCK APRN NK (MISCELLANEOUS) ×2 IMPLANT
KIT PROCEDURE OLYMPUS (MISCELLANEOUS) ×1 IMPLANT
MANIFOLD NEPTUNE II (INSTRUMENTS) ×1 IMPLANT
WATER STERILE IRR 250ML POUR (IV SOLUTION) ×1 IMPLANT

## 2024-01-21 NOTE — H&P (Signed)
 Danielle JONELLE [name], MD [facility], DHIP [address]  [place], [place] [address]  Main: [phone] Fax:  [phone] Pager: [phone]   Primary Care Physician:  [name], MD Primary Gastroenterologist:  Dr. Corinn JONELLE Duarte  Pre-Procedure History & Physical: HPI:  Danielle Duarte is a 63 y.o. female is here for an colonoscopy.   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  She will email office a copy of these results.   COLPOSCOPY  03/01/2010   HERNIA REPAIR  01/2013   right abdominal wall, with mesh   LAPAROSCOPIC LYSIS OF ADHESIONS  02/28/2012   Procedure: LAPAROSCOPIC LYSIS OF ADHESIONS;  Surgeon: [name], MD;  Location: [facility];  Service: Gynecology;  Laterality: N/A;   LAPAROSCOPY  02/28/2012   Procedure: LAPAROSCOPY OPERATIVE;  Surgeon: [name], MD;  Location: [facility];  Service: Gynecology;  Laterality: N/A;   SALPINGOOPHORECTOMY  02/28/2012   Procedure: SALPINGO OOPHORECTOMY;  Surgeon: [name], MD;  Location: [facility];  Service: Gynecology;   Laterality: Left;   TOTAL HIP ARTHROPLASTY  04/19/2015   Left   TOTAL KNEE ARTHROPLASTY Left 08/26/2018   Dr. Burnard at [facility]    Prior to Admission medications   Medication Sig Start Date End Date Taking? Authorizing Provider  azelastine (ASTELIN) 0.1 % nasal spray Place into both nostrils 2 (two) times daily. Use in each nostril as directed   Yes [provider]  Estradiol 10 MCG TABS vaginal tablet One tablet placed vaginally every day for one week, then do this twice a week for maintenance 12/20/23  Yes Anyanwu, Ugonna A, MD  Fluocinolone  Acetonide 0.01 % OIL Place 1 application  in ear(s) as directed. 1-3 times per week prn 05/04/22  Yes Hester Alm BROCKS, MD  fluticasone  furoate-vilanterol (BREO ELLIPTA) 200-25 MCG/ACT AEPB Inhale 1 puff into the lungs daily.   Yes [provider]  levothyroxine  (SYNTHROID ) 75 MCG tablet TAKE 1 TABLET BY MOUTH EVERY DAY 12/01/21  Yes [name], MD  Multiple Vitamin (MULTIVITAMIN WITH MINERALS) TABS Take 1 tablet by mouth daily.   Yes [provider]  PROAIR  HFA 108 (90 Base) MCG/ACT inhaler TAKE 2 PUFFS BY MOUTH EVERY 4 TO 6 HOURS AS NEEDED . MUST LAST 90 DAYS PER MD 08/10/15  Yes [provider]  Trospium Chloride 60 MG CP24 Take 1 capsule (60 mg total) by mouth daily. 01/16/24  Yes [name], MD    Allergies as of 11/13/2023 - Review Complete  11/22/2022  Allergen Reaction Noted   Dust mite extract  03/30/2015   Other Other (See Comments) 03/31/2015   Pollen extract  03/30/2015    Family History  Problem Relation Age of Onset   Hypertension Mother    Hypertension Father    Diabetes Paternal Uncle    Bladder Cancer Maternal Uncle    Breast cancer Neg Hx    Uterine cancer Neg Hx    Renal cancer Neg Hx     Social History   Socioeconomic History   Marital status: Divorced    Spouse name: Not on file   Number of children: Not on file   Years of education: Not on file   Highest education  level: Not on file  Occupational History   Not on file  Tobacco Use   Smoking status: Former    Current packs/day: 0.00    Types: Cigarettes    Quit date: 03/13/1987    Years since quitting: 36.8   Smokeless tobacco: Never  Vaping Use   Vaping status: Never Used  Substance and Sexual Activity   Alcohol use: No   Drug use: No   Sexual activity: Not Currently  Other Topics Concern   Not on file  Social History Narrative   Not on file   Social Drivers of Health   Financial Resource Strain: Low Risk  (08/30/2023)   Received from [facility]   Overall Financial Resource Strain (CARDIA)    Difficulty of Paying Living Expenses: Not hard at all  Food Insecurity: No Food Insecurity (08/30/2023)   Received [facility]   Hunger Vital Sign    Within the past 12 months, you worried that your food would run out before you got the money to buy more.: Never true    Within the past 12 months, the food you bought just didn't last and you didn't have money to get more.: Never true  Transportation Needs: No Transportation Needs (08/30/2023)   Received from [facility]    In the past 12 months, has lack of transportation kept you from medical appointments or from getting medications?: No    Lack of Transportation (Non-Medical): No  Physical Activity: Not on file  Stress: Not on file  Social Connections: Not on file  Intimate Partner Violence: Not on file    Review of Systems: See HPI, otherwise negative ROS  Physical Exam: BP (!) 162/95   Temp (!) 97.5 F (36.4 C) (Temporal)   Resp (!) 25   Ht 5' 2.01 (1.575 m)   Wt 94.8 kg   LMP 11/23/2011   SpO2 95%   BMI 38.24 kg/m  General:   Alert,  pleasant and cooperative in NAD Head:  Normocephalic and atraumatic. Neck:  Supple; no masses or thyromegaly. Lungs:  Clear throughout to auscultation.    Heart:  Regular rate and rhythm. Abdomen:  Soft,  nontender and nondistended. Normal bowel sounds, without guarding, and without rebound.   Neurologic:  Alert and  oriented x4;  grossly normal neurologically.  Impression/Plan: Shaquala A Olden is here for an colonoscopy to be performed for h/o colon adenoma  Risks, benefits, limitations, and alternatives regarding  colonoscopy have been reviewed with the patient.  Questions have been answered.  All parties agreeable.   Danielle [name], MD  01/21/2024, 8:27 AM

## 2024-01-21 NOTE — Op Note (Signed)
 [facility] Patient Name: Danielle Duarte Procedure Date: 01/21/2024 8:39 AM MRN: [id number] Account #: [id number] Date of Birth: 06-21-1960 Admit Type: Outpatient Age: 63 Room: [facility] OR ROOM 01 Gender: Female Note Status: Finalized Instrument Name: Arvis [id number] Procedure:             Colonoscopy Indications:           Surveillance: Personal history of adenomatous polyps                         on last colonoscopy 3 years ago, Last colonoscopy: May                         2022 Providers:             Corinn Jess [name] MD, MD Referring MD:          Tamra Leventhal, MD (Referring MD) Medicines:             General Anesthesia Complications:         No immediate complications. Estimated blood loss: None. Procedure:             Pre-Anesthesia Assessment:                        - Prior to the procedure, a History and Physical was                         performed, and patient medications and allergies were                         reviewed. The patient is competent. The risks and                         benefits of the procedure and the sedation options and                         risks were discussed with the patient. All questions                         were answered and informed consent was obtained.                         Patient identification and proposed procedure were                         verified by the physician, the nurse, the                         anesthesiologist, the anesthetist and the technician                         in the pre-procedure area in the procedure room in the                         endoscopy suite. Mental Status Examination: alert and                         oriented. Airway Examination: normal oropharyngeal  airway and neck mobility. Respiratory Examination:                         clear to auscultation. CV Examination: normal.                         Prophylactic Antibiotics: The patient  does not require                         prophylactic antibiotics. Prior Anticoagulants: The                         patient has taken no anticoagulant or antiplatelet                         agents. ASA Grade Assessment: III - A patient with                         severe systemic disease. After reviewing the risks and                         benefits, the patient was deemed in satisfactory                         condition to undergo the procedure. The anesthesia                         plan was to use general anesthesia. Immediately prior                         to administration of medications, the patient was                         re-assessed for adequacy to receive sedatives. The                         heart rate, respiratory rate, oxygen saturations,                         blood pressure, adequacy of pulmonary ventilation, and                         response to care were monitored throughout the                         procedure. The physical status of the patient was                         re-assessed after the procedure.                        After obtaining informed consent, the colonoscope was                         passed under direct vision. Throughout the procedure,                         the patient's blood pressure, pulse, and oxygen  saturations were monitored continuously. The                         Colonoscope was introduced through the anus and                         advanced to the the cecum, identified by appendiceal                         orifice and ileocecal valve. The colonoscopy was                         performed without difficulty. The patient tolerated                         the procedure well. The quality of the bowel                         preparation was evaluated using the BBPS [facility] Bowel                         Preparation Scale) with scores of: Right Colon = 3,                         Transverse Colon = 3 and Left  Colon = 3 (entire mucosa                         seen well with no residual staining, small fragments                         of stool or opaque liquid). The total BBPS score                         equals 9. The ileocecal valve, appendiceal orifice,                         and rectum were photographed. Findings:      The perianal and digital rectal examinations were normal. Pertinent       negatives include normal sphincter tone and no palpable rectal lesions.      Multiple medium-mouthed diverticula were found in the entire colon.       There was no evidence of diverticular bleeding.      Non-bleeding external and internal hemorrhoids were found during       retroflexion. The hemorrhoids were medium-sized. Impression:            - Severe diverticulosis in the entire examined colon.                         There was no evidence of diverticular bleeding.                        - Non-bleeding external and internal hemorrhoids.                        - No specimens collected. Recommendation:        - Discharge patient to home (with escort).                        -  Resume previous diet today.                        - Continue present medications.                        - Repeat colonoscopy in 5 years for surveillance. Procedure Code(s):     --- Professional ---                        H9894, Colorectal cancer screening; colonoscopy on                         individual at high risk Diagnosis Code(s):     --- Professional ---                        Z86.010, Personal history of colonic polyps                        K64.8, Other hemorrhoids                        K57.30, Diverticulosis of large intestine without                         perforation or abscess without bleeding CPT copyright 2022 American Medical Association. All rights reserved. The codes documented in this report are preliminary and upon coder review may  be revised to meet current compliance requirements. Dr. Corinn [name] Corinn Jess [name] MD, MD 01/21/2024 8:59:32 AM This report has been signed electronically. Number of Addenda: 0 Note Initiated On: 01/21/2024 8:39 AM Scope Withdrawal Time: 0 hours 6 minutes 29 seconds  Total Procedure Duration: 0 hours 8 minutes 21 seconds  Estimated Blood Loss:  Estimated blood loss: none.      [facility]

## 2024-01-21 NOTE — Transfer of Care (Signed)
 Immediate Anesthesia Transfer of Care Note  Patient: Danielle Duarte  Procedure(s) Performed: COLONOSCOPY  Patient Location: PACU  Anesthesia Type: General  Level of Consciousness: awake, alert  and patient cooperative  Airway and Oxygen Therapy: Patient Spontanous Breathing   Post-op Assessment: Post-op Vital signs reviewed, Patient's Cardiovascular Status Stable, Respiratory Function Stable, Patent Airway and No signs of Nausea or vomiting  Post-op Vital Signs: Reviewed and stable  Complications: No notable events documented.

## 2024-01-21 NOTE — Anesthesia Preprocedure Evaluation (Addendum)
 Anesthesia Evaluation  Patient identified by MRN, date of birth, ID band Patient awake    Reviewed: Allergy & Precautions, H&P , NPO status , Patient's Chart, lab work & pertinent test results  History of Anesthesia Complications (+) PONV and history of anesthetic complications  Airway Mallampati: IV  TM Distance: <3 FB Neck ROM: Full  Mouth opening: Limited Mouth Opening Comment: Very short TMD, shorter, thick neck, Mallampati IV, would expect difficult, anterior airway if intubation were needed  Discussed possible sleep apnea. Patient snores, has awakened herself, some daytime tiredness, discussed risks untreated sleep apnea, patient has appointment in a few days with PCP, will discuss testing  Dental no notable dental hx. (+) Caps No caps in front teeth:   Pulmonary asthma , former smoker   Pulmonary exam normal breath sounds clear to auscultation       Cardiovascular negative cardio ROS Normal cardiovascular exam Rhythm:Regular Rate:Normal     Neuro/Psych negative neurological ROS  negative psych ROS   GI/Hepatic negative GI ROS, Neg liver ROS,,,  Endo/Other  negative endocrine ROSHypothyroidism    Renal/GU negative Renal ROS  negative genitourinary   Musculoskeletal negative musculoskeletal ROS (+) Arthritis ,    Abdominal   Peds negative pediatric ROS (+)  Hematology negative hematology ROS (+)   Anesthesia Other Findings Asthma Arthritis Paroxysmal supraventricular tachycardia    PSVT 10-26-215 Hypothyroidism PONV  Reproductive/Obstetrics negative OB ROS                              Anesthesia Physical Anesthesia Plan  ASA: 3  Anesthesia Plan: General   Post-op Pain Management:    Induction: Intravenous  PONV Risk Score and Plan:   Airway Management Planned: Natural Airway and Nasal Cannula  Additional Equipment:   Intra-op Plan:   Post-operative Plan:    Informed Consent: I have reviewed the patients History and Physical, chart, labs and discussed the procedure including the risks, benefits and alternatives for the proposed anesthesia with the patient or authorized representative who has indicated his/her understanding and acceptance.     Dental Advisory Given  Plan Discussed with: Anesthesiologist, CRNA and Surgeon  Anesthesia Plan Comments: (Patient consented for risks of anesthesia including but not limited to:  - adverse reactions to medications - risk of airway placement if required - damage to eyes, teeth, lips or other oral mucosa - nerve damage due to positioning  - sore throat or hoarseness - Damage to heart, brain, nerves, lungs, other parts of body or loss of life  Patient voiced understanding and assent.)         Anesthesia Quick Evaluation

## 2024-01-21 NOTE — Anesthesia Postprocedure Evaluation (Signed)
 Anesthesia Post Note  Patient: Danielle Duarte  Procedure(s) Performed: COLONOSCOPY  Patient location during evaluation: PACU Anesthesia Type: General Level of consciousness: awake and alert Pain management: pain level controlled Vital Signs Assessment: post-procedure vital signs reviewed and stable Respiratory status: spontaneous breathing, nonlabored ventilation, respiratory function stable and patient connected to nasal cannula oxygen Cardiovascular status: blood pressure returned to baseline and stable Postop Assessment: no apparent nausea or vomiting Anesthetic complications: no   No notable events documented.   Last Vitals:  Vitals:   01/21/24 0900 01/21/24 0907  BP: (!) 142/90 (!) 134/90  Pulse: 73 73  Resp: 17 (!) 26  Temp:    SpO2: 95% 93%    Last Pain:  Vitals:   01/21/24 0857  TempSrc:   PainSc: 0-No pain                 [place] [facility]

## 2024-01-28 ENCOUNTER — Telehealth: Payer: Self-pay | Admitting: Obstetrics & Gynecology

## 2024-01-28 NOTE — Telephone Encounter (Signed)
 Pt is calling wanting to know what the additional charge for patient visit was for when she had her Annual Exam with Dr. Herchel 12/20/2023.  Pt stated that the annual was coded correctly but the additional charge she did not understand it and her insurance told her to call the office for clarification on it.   Pt would like to have a call back.

## 2024-01-30 ENCOUNTER — Other Ambulatory Visit: Payer: Self-pay | Admitting: Internal Medicine

## 2024-01-30 DIAGNOSIS — R9389 Abnormal findings on diagnostic imaging of other specified body structures: Secondary | ICD-10-CM

## 2024-01-30 DIAGNOSIS — R053 Chronic cough: Secondary | ICD-10-CM

## 2024-01-30 DIAGNOSIS — J9801 Acute bronchospasm: Secondary | ICD-10-CM

## 2024-01-31 ENCOUNTER — Ambulatory Visit
Admission: RE | Admit: 2024-01-31 | Discharge: 2024-01-31 | Disposition: A | Discharge status: Home / Self Care | Source: Ambulatory Visit | Attending: Internal Medicine | Admitting: Internal Medicine

## 2024-01-31 ENCOUNTER — Ambulatory Visit: Attending: Obstetrics and Gynecology

## 2024-01-31 ENCOUNTER — Other Ambulatory Visit: Payer: Self-pay

## 2024-01-31 DIAGNOSIS — N3281 Overactive bladder: Secondary | ICD-10-CM | POA: Insufficient documentation

## 2024-01-31 DIAGNOSIS — N393 Stress incontinence (female) (male): Secondary | ICD-10-CM | POA: Insufficient documentation

## 2024-01-31 DIAGNOSIS — J9801 Acute bronchospasm: Secondary | ICD-10-CM | POA: Insufficient documentation

## 2024-01-31 DIAGNOSIS — R053 Chronic cough: Secondary | ICD-10-CM | POA: Insufficient documentation

## 2024-01-31 DIAGNOSIS — R2689 Other abnormalities of gait and mobility: Secondary | ICD-10-CM | POA: Insufficient documentation

## 2024-01-31 DIAGNOSIS — M6281 Muscle weakness (generalized): Secondary | ICD-10-CM | POA: Diagnosis present

## 2024-01-31 DIAGNOSIS — R9389 Abnormal findings on diagnostic imaging of other specified body structures: Secondary | ICD-10-CM | POA: Insufficient documentation

## 2024-01-31 MED ORDER — IOHEXOL 300 MG/ML  SOLN
75.0000 mL | Freq: Once | INTRAMUSCULAR | Status: AC | PRN
  Administered 2024-01-31: 75 mL via INTRAVENOUS

## 2024-01-31 NOTE — Patient Instructions (Signed)
 TOILET POSTURE: Urination: feet flat, lean forward with forearms on legs to fully empty bladder. Bowel movement: place feet flat on Squatty Potty or stool so knees are higher than hips, lean forward to relax pelvic floor in order to avoid strain.  SHOES: wear supportive shoes, and sandals with straps.  POSTURE: try not to cross legs at knees or ankles. Try the figure four stretch instead.  WATER : start with water  first thing in the morning.   PELVIC TILTS: try to stand in neutral, not tucking your tail and not arching back, but in the middle.  Bladder diary:   ~Urinating every 2-3 hours ~Urine stream for at least 8 seconds. ~Urine color should be light, lemonade  ~If you have the urge, perform deep breathing for 5 reps and then 5-10 reps of pelvic floor contraction.  ~When do you feel the urge?

## 2024-01-31 NOTE — Therapy (Signed)
 OUTPATIENT PHYSICAL THERAPY FEMALE PELVIC EVALUATION   Patient Name: Danielle Duarte MRN: [id number] DOB:Jan 20, 1961, 63 y.o., female Today's Date: 01/31/2024  END OF SESSION:  PT End of Session - 01/31/24 1333     Visit Number 1    Number of Visits 9    Date for Recertification  03/31/24    Authorization Type [employer]    Progress Note Due on Visit 10    PT Start Time 1315    PT Stop Time 1405    PT Time Calculation (min) 50 min    Activity Tolerance Patient tolerated treatment well    Behavior During Therapy [facility] for tasks assessed/performed          Past Medical History:  Diagnosis Date   Abnormal Pap smear    Allergic rhinitis    Allergic rhinitis with postnasal drip    Arthritis    right knee   Asthma    Dysplastic nevus 03/24/2019   spinal mid upper back, mod atypia    Fibroids    H/O total hip arthroplasty, left 04/19/2015   Hx of total knee arthroplasty, right 08/26/2018   Hypothyroidism    Normal cytology on  Pap smear with positive HPV test 06/12/2012   Repeat cotesting in one year    Paroxysmal supraventricular tachycardia 01/05/2014   PONV (postoperative nausea and vomiting)    Refractory chronic cough    Past Surgical History:  Procedure Laterality Date   APPENDECTOMY  12/1999   COLONOSCOPY  10/10/2010   COLONOSCOPY N/A 01/21/2024   Procedure: COLONOSCOPY;  Surgeon: Unk [name], MD;  Location: [facility];  Service: Endoscopy;  Laterality: N/A;   COLONOSCOPY WITH PROPOFOL  N/A 07/26/2020   Procedure: COLONOSCOPY WITH PROPOFOL ;  Surgeon: Unk [name], MD;  Location: [facility];  Service: Gastroenterology;  Laterality: N/A;  Patient tested positive for COVID on 06/11/20.  She will email office a copy of these results.   COLPOSCOPY  03/01/2010   HERNIA REPAIR  01/2013   right abdominal wall, with mesh   LAPAROSCOPIC LYSIS OF ADHESIONS  02/28/2012   Procedure: LAPAROSCOPIC LYSIS OF ADHESIONS;  Surgeon: [name],  MD;  Location: [facility];  Service: Gynecology;  Laterality: N/A;   LAPAROSCOPY  02/28/2012   Procedure: LAPAROSCOPY OPERATIVE;  Surgeon: [name], MD;  Location: [facility];  Service: Gynecology;  Laterality: N/A;   R knee meniscus repair Right 2007   SALPINGOOPHORECTOMY  02/28/2012   Procedure: SALPINGO OOPHORECTOMY;  Surgeon: [name], MD;  Location: [facility];  Service: Gynecology;  Laterality: Left;   TOTAL HIP ARTHROPLASTY  04/19/2015   Left   TOTAL KNEE ARTHROPLASTY Right 08/26/2018   Dr. Burnard at [facility]   Patient Active Problem List   Diagnosis Date Noted   SUI (stress urinary incontinence, female) 01/16/2024   Overactive bladder 01/16/2024   Family history of osteoporosis 05/11/2020   ASCUS with positive high risk HPV cervical pap on 04/20/2020 04/29/2020   History of PSVT (paroxysmal supraventricular tachycardia) 08/26/2018   Immune to varicella 04/09/2018   Vaginal dryness, menopausal 10/25/2017   Varicose veins of leg with swelling, 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/18/2017   Inclusion cyst of vulva 08/15/2016   Status post left hip replacement 04/19/2015   Allergic rhinitis 03/30/2015   Allergic asthma 03/30/2015   Direct hernia 03/30/2015   Globus sensation 03/30/2015   HLD (hyperlipidemia) 03/30/2015   BMI 34.0-34.9,adult 03/30/2015   Beat, premature ventricular 03/30/2015   Nail deformity 03/30/2015   Degenerative arthritis  of hip 01/14/2015   Arthritis of knee, degenerative 01/14/2015   Hypothyroid 12/07/2014   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 Sadie  REFERRING PROVIDER: Rosaline Caper, MD  REFERRING DIAG: SUI and overactive bladder  THERAPY DIAG:  SUI (stress urinary incontinence, female) - Plan: PT plan of care cert/re-cert  Muscle weakness (generalized) - Plan: PT plan of care cert/re-cert  Other abnormalities of gait and mobility - Plan: PT plan of care cert/re-cert  Overactive bladder - Plan: PT plan of care  cert/re-cert  Rationale for Evaluation and Treatment: Rehabilitation  ONSET DATE: 01/16/24 referral date but going on for years  SUBJECTIVE:                                                                                                                                                                                           SUBJECTIVE STATEMENT: URINARY FUNCTION: gets up three times/night, has recorded 24x/day one day. Pt voids at least ten times a day. Pt denied pain with voiding. Not sure if she's fully emptying bladder. 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 Mixed UI.  BOWEL FUNCTION: about twice a day. 75% of the time stool is normal. She knows food that triggers her: dairy. Pt denied pain with BM. She might have a hemorrhoids. Pt does not take fiber, laxatives, stool softeners.  CORE STABILITY: appendix, ovary and hernia repairs via abdominal. Core feels weak. No chronic back pain. Denied any injuries to coccyx or hips.  SEXUAL FUNCTION: not sexually active (no partner) but hx of very painful periods prior to children. No issues with climaxing. OBGYN exams are not painful.  Fluid intake: drinks mostly water  (likes ice water ), sweet tea or soda/day, sometimes coffee or juice, beer or wine rarely   FUNCTIONAL LIMITATIONS: leak bending forward, coughing, sneezing and she's afraid of leaking when she goes out  PERTINENT HISTORY:  Medications for current condition: has not started yet Surgeries: not for this Other: R knee arthritis, asthma, hx of ovarian fibroids, hypothyroidism, HPV, paroxysmal supraventricular tachycardia, appendectomy, R ab wall hernia repair, oophorectomy, R TKA and L THA Sexual abuse: No  PAIN:  Are you having pain? No NPRS scale: 0/10  PRECAUTIONS: None  RED FLAGS: None   WEIGHT BEARING RESTRICTIONS: No  FALLS:  Has patient fallen in last 6 months? 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  ACTIVITY LEVEL : trying to  get back into the gym (but was sick with COVID for six weeks this fall), likes to walking and water  aerobics  PLOF: Independent  PATIENT GOALS: not wear  a pad (1-2/day a pantyliner now but pads if she's sick or working, more if leaking)   BOWEL MOVEMENT: Pain with bowel movement: No Type of bowel movement:Frequency twice a day, dairy does cause diarrhea Fully empty rectum: Yes:   Leakage: No                                                  Caused by:  Bowel urgency: no Pads: No Fiber supplement/laxative No  URINATION: Pain with urination: No Fully empty bladder: No                                         Post-void dribble: No Stream: Strong Urgency: Yes  Frequency:during the day 10x/day and up to 24x/day                                                        Nocturia: Yes: three/night   Leakage: Urge to void, Walking to the bathroom, Coughing, Sneezing, Laughing, and Lifting Pads/briefs: Yes, 1-2 pantyliners per day or 1-2 pads if sick 2/2 SUI  INTERCOURSE:  Not currently sexually active as she does not have a partner at this time  PREGNANCY: Number of pregnancies: 2 Vaginal deliveries 2 Tearing No Episiotomy Yes  C-section deliveries 0 Currently pregnant No  PROLAPSE: None   OBJECTIVE:  Note: Objective measures were completed at Evaluation unless otherwise noted.    COGNITION: Overall cognitive status: Within functional limits for tasks assessed     SENSATION: Light touch: Appears intact   FUNCTIONAL TESTS:   Single leg stance:  Rt: incr. Postural sway  Lt: incr. Postural sway (worse than R) Sit-up test: Squat: Bed mobility:  GAIT: Assistive device utilized: None Comments: decr. Trunk rot and stride length  POSTURE: increased lumbar lordosis, increased thoracic kyphosis, and anterior pelvic tilt   LUMBARAROM/PROM:  A/PROM A/PROM  Eval (% available)  Flexion   Extension   Right lateral flexion   Left lateral flexion   Right rotation    Left rotation    (Blank rows = not tested)  LOWER EXTREMITY ROM:  Active ROM Right eval Left eval  Hip flexion    Hip extension    Hip abduction    Hip adduction    Hip internal rotation    Hip external rotation    Knee flexion    Knee extension    Ankle dorsiflexion    Ankle plantarflexion    Ankle inversion    Ankle eversion     (Blank rows = not tested)  LOWER EXTREMITY MMT:  MMT Right eval Left eval  Hip flexion    Hip extension    Hip abduction    Hip adduction    Hip internal rotation    Hip external rotation    Knee flexion    Knee extension    Ankle dorsiflexion    Ankle plantarflexion    Ankle inversion    Ankle eversion     (Blank rows = not tested) PALPATION:  General: no TTP over cx-lx spine in standing.   PELVIC MMT:  MMT eval  Vaginal   Internal Anal Sphincter   External Anal Sphincter   Puborectalis   (Blank rows = not tested)        TONE: limited by time constraints   PROLAPSE: limited by time constraints   TODAY'S TREATMENT:                                                                                                                              DATE: 01/31/24  EVAL   SELF CARE:  PATIENT EDUCATION:  Education details: PT educated pt on main functions of the pelvic floor, IAP, breath and PFM relationship. PT discussed POC, frequency and duration. PT provided the following education: TOILET POSTURE: Urination: feet flat, lean forward with forearms on legs to fully empty bladder. Bowel movement: place feet flat on Squatty Potty or stool so knees are higher than hips, lean forward to relax pelvic floor in order to avoid strain.  SHOES: wear supportive shoes, and sandals with straps.  POSTURE: try not to cross legs at knees or ankles. Try the figure four stretch instead.  WATER: start with water first thing in the morning.   PELVIC TILTS: try to stand in neutral, not tucking your tail and not arching back, but in the  middle. Bladder diary:   ~Urinating every 2-3 hours ~Urine stream for at least 8 seconds. ~Urine color should be light, lemonade  ~If you have the urge, perform deep breathing for 5 reps and then 5-10 reps of pelvic floor contraction.  ~When do you feel the urge?  Person educated: Patient Education method: Explanation, Demonstration, and Handouts Education comprehension: verbalized understanding, returned demonstration, and needs further education  HOME EXERCISE PROGRAM: Not yet established  ASSESSMENT:  CLINICAL IMPRESSION: Patient is a pleasant 63 y.o. female who was seen today for physical therapy evaluation and treatment for mixed urinary incontinence.  Pt's PMH is significant for the following: R knee arthritis, asthma, hx of ovarian fibroids, hypothyroidism, HPV, paroxysmal supraventricular tachycardia, appendectomy, R ab wall hernia repair, oophorectomy, R TKA and L THA. The following impairments were noted upon exam: limited ROM, postural dysfunction, decr. Strength likely 2/2 subjective reports and gait deviations, nocturia, mixed UI. Pt would benefit from skilled PT to improve safety and decr. Pain during all ADLs.   OBJECTIVE IMPAIRMENTS: Abnormal gait, decreased balance, decreased coordination, decreased endurance, decreased mobility, decreased ROM, decreased strength, hypomobility, increased fascial restrictions, and postural dysfunction.   ACTIVITY LIMITATIONS: carrying, lifting, bending, standing, squatting, sleeping, transfers, continence, toileting, and locomotion level  PARTICIPATION LIMITATIONS: meal prep, cleaning, laundry, shopping, community activity, and occupation  PERSONAL FACTORS: Age, Past/current experiences, Time since onset of injury/illness/exacerbation, and 3+ comorbidities: see above are also affecting patient's functional outcome.   REHAB POTENTIAL: Good  CLINICAL DECISION MAKING: Stable/uncomplicated  EVALUATION COMPLEXITY: Low   GOALS: Goals  reviewed with patient? Yes  SHORT TERM GOALS: Target date: for all STGs: 02/28/24  Pt will be IND in HEP to  improve pain, strength, coordination. Baseline: no HEP (was going to the gym and walking but then was sick with COVID for 6 weeks) Goal status: INITIAL  2.  Finish exam and write goals as indicated. Baseline: limited by time constraints Goal status: INITIAL  3.  Pt will demo proper toileting posture to fully empty bladder and reduce straining during bowel movement. Baseline: unable to demo Goal status: INITIAL  4.  Pt will demonstrated improved relaxation and contraction of PFM with coordination of breath to reduce urinary leakage to </=four/week. Baseline: daily (1-2 pantyliners or pads) Goal status: INITIAL  LONG TERM GOALS: Target date: for all the LTGs: 03/27/24  Pt will demonstrated improved relaxation and contraction of PFM with coordination of breath to reduce urinary leakage to </=once/week. Baseline: daily (1-2 pantyliners or pads) Goal status: INITIAL  2.  Pt will report going back to the gym for classes to maximize gains made in PT. Baseline: no HEP Goal status: INITIAL  3.  Pt will demonstrate improved bladder behaviors and improved coordination of PFM with breath to decr. Nocturia for </=1/night. Baseline: 3x/night Goal status: INITIAL  4.  Pt will demonstrate improved bladder behaviors and improved coordination of PFM with breath to decr. Voiding frequency to once every 2-3 hours. Baseline: avg: 10x/day and up to 24x/day Goal status: INITIAL   PLAN: finish exam (palpation, ROM, MMT, DR) Establish HEP.   PT FREQUENCY: 1x/week  PT DURATION: 8 weeks  PLANNED INTERVENTIONS: 97164- PT Re-evaluation, 97110-Therapeutic exercises, 97530- Therapeutic activity, 97112- Neuromuscular re-education, 97535- Self Care, [id number]- Manual therapy, [phone]- Gait training, [phone] (1-2 muscles), [phone] (3+ muscles)- Dry Needling, Patient/Family education, Balance training, Taping,  Joint mobilization, Spinal mobilization, Scar mobilization, Cryotherapy, Moist heat, and Biofeedback    Malcolm Quast L, PT 01/31/2024, 5:04 PM  Delon Pinal, PT,DPT 01/31/24 5:04 PM Phone: [phone] Fax: [phone]

## 2024-02-05 ENCOUNTER — Ambulatory Visit

## 2024-02-05 ENCOUNTER — Other Ambulatory Visit: Payer: Self-pay

## 2024-02-05 DIAGNOSIS — R2689 Other abnormalities of gait and mobility: Secondary | ICD-10-CM

## 2024-02-05 DIAGNOSIS — M6281 Muscle weakness (generalized): Secondary | ICD-10-CM

## 2024-02-05 DIAGNOSIS — N393 Stress incontinence (female) (male): Secondary | ICD-10-CM | POA: Diagnosis not present

## 2024-02-05 DIAGNOSIS — N3281 Overactive bladder: Secondary | ICD-10-CM

## 2024-02-05 NOTE — Therapy (Addendum)
 OUTPATIENT PHYSICAL THERAPY FEMALE PELVIC TREATMENT    Patient Name: SHAKYIA BOSSO MRN: [id number] DOB:06-07-1960, 63 y.o., female Today's Date: 02/05/2024  END OF SESSION:  PT End of Session - 02/05/24 0940     Visit Number 2    Number of Visits 9    Date for Recertification  03/31/24    Authorization Type [employer]    Progress Note Due on Visit 10    PT Start Time [phone]    PT Stop Time 1015    PT Time Calculation (min) 38 min    Activity Tolerance Patient tolerated treatment well    Behavior During Therapy [facility] for tasks assessed/performed          Past Medical History:  Diagnosis Date   Abnormal Pap smear    Allergic rhinitis    Allergic rhinitis with postnasal drip    Arthritis    right knee   Asthma    Dysplastic nevus 03/24/2019   spinal mid upper back, mod atypia    Fibroids    H/O total hip arthroplasty, left 04/19/2015   Hx of total knee arthroplasty, right 08/26/2018   Hypothyroidism    Normal cytology on  Pap smear with positive HPV test 06/12/2012   Repeat cotesting in one year    Paroxysmal supraventricular tachycardia 01/05/2014   PONV (postoperative nausea and vomiting)    Refractory chronic cough    Past Surgical History:  Procedure Laterality Date   APPENDECTOMY  12/1999   COLONOSCOPY  10/10/2010   COLONOSCOPY N/A 01/21/2024   Procedure: COLONOSCOPY;  Surgeon: Unk [name], MD;  Location: [facility];  Service: Endoscopy;  Laterality: N/A;   COLONOSCOPY WITH PROPOFOL  N/A 07/26/2020   Procedure: COLONOSCOPY WITH PROPOFOL ;  Surgeon: Unk [name], MD;  Location: [facility];  Service: Gastroenterology;  Laterality: N/A;  Patient tested positive for COVID on 06/11/20.  She will email office a copy of these results.   COLPOSCOPY  03/01/2010   HERNIA REPAIR  01/2013   right abdominal wall, with mesh   LAPAROSCOPIC LYSIS OF ADHESIONS  02/28/2012   Procedure: LAPAROSCOPIC LYSIS OF ADHESIONS;  Surgeon: [name],  MD;  Location: [facility];  Service: Gynecology;  Laterality: N/A;   LAPAROSCOPY  02/28/2012   Procedure: LAPAROSCOPY OPERATIVE;  Surgeon: [name], MD;  Location: [facility];  Service: Gynecology;  Laterality: N/A;   R knee meniscus repair Right 2007   SALPINGOOPHORECTOMY  02/28/2012   Procedure: SALPINGO OOPHORECTOMY;  Surgeon: [name], MD;  Location: [facility];  Service: Gynecology;  Laterality: Left;   TOTAL HIP ARTHROPLASTY  04/19/2015   Left   TOTAL KNEE ARTHROPLASTY Right 08/26/2018   Dr. Burnard at [facility]   Patient Active Problem List   Diagnosis Date Noted   SUI (stress urinary incontinence, female) 01/16/2024   Overactive bladder 01/16/2024   Family history of osteoporosis 05/11/2020   ASCUS with positive high risk HPV cervical pap on 04/20/2020 04/29/2020   History of PSVT (paroxysmal supraventricular tachycardia) 08/26/2018   Immune to varicella 04/09/2018   Vaginal dryness, menopausal 10/25/2017   Varicose veins of leg with swelling, 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/18/2017   Inclusion cyst of vulva 08/15/2016   Status post left hip replacement 04/19/2015   Allergic rhinitis 03/30/2015   Allergic asthma 03/30/2015   Direct hernia 03/30/2015   Globus sensation 03/30/2015   HLD (hyperlipidemia) 03/30/2015   BMI 34.0-34.9,adult 03/30/2015   Beat, premature ventricular 03/30/2015   Nail deformity 03/30/2015   Degenerative  arthritis of hip 01/14/2015   Arthritis of knee, degenerative 01/14/2015   Hypothyroid 12/07/2014   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 Sadie  REFERRING PROVIDER: Rosaline Caper, MD  REFERRING DIAG: SUI and overactive bladder  THERAPY DIAG:  SUI (stress urinary incontinence, female)  Muscle weakness (generalized)  Other abnormalities of gait and mobility  Overactive bladder  Rationale for Evaluation and Treatment: Rehabilitation  ONSET DATE: 01/16/24 referral date but going on for years  SUBJECTIVE:                                                                                                                                                                                            SUBJECTIVE STATEMENT: She took meds for overactive bladder but her mouth was too dry and she did not like the side effects. She had a craft show this weekend which had her lifting a lot and not on a normal routine because of it. Her squatty potty arrived and drinking room temp water . Bladder diary: getting 2-3x/night, voiding 7-9x/day. She has started proper toileting posture and feels that it is helping. She will be tested for sleep apnea in 04/2024 as schedule is backed up but she'll check for a sooner appt.   Eval: URINARY FUNCTION: gets up three times/night, has recorded 24x/day one day. Pt voids at least ten times a day. Pt denied pain with voiding. Not sure if she's fully emptying bladder. 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 Mixed UI.  BOWEL FUNCTION: about twice a day. 75% of the time stool is normal. She knows food that triggers her: dairy. Pt denied pain with BM. She might have a hemorrhoids. Pt does not take fiber, laxatives, stool softeners.  CORE STABILITY: appendix, ovary and hernia repairs via abdominal. Core feels weak. No chronic back pain. Denied any injuries to coccyx or hips.  SEXUAL FUNCTION: not sexually active (no partner) but hx of very painful periods prior to children. No issues with climaxing. OBGYN exams are not painful.  Fluid intake: drinks mostly water  (likes ice water ), sweet tea or soda/day, sometimes coffee or juice, beer or wine rarely   FUNCTIONAL LIMITATIONS: leak bending forward, coughing, sneezing and she's afraid of leaking when she goes out  PERTINENT HISTORY:  Medications for current condition: has not started yet Surgeries: not for this Other: R knee arthritis, asthma, hx of ovarian fibroids, hypothyroidism, HPV, paroxysmal supraventricular tachycardia, appendectomy, R ab  wall hernia repair, oophorectomy, R TKA and L THA Sexual abuse: No  PAIN:  Are you having pain? No 02/05/24 NPRS scale: 0/10  PRECAUTIONS: None  RED FLAGS: None   WEIGHT BEARING RESTRICTIONS: No  FALLS:  Has patient fallen in last 6 months? 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  ACTIVITY LEVEL : trying to get back into the gym (but was sick with COVID for six weeks this fall), likes to walking and water  aerobics  PLOF: Independent  PATIENT GOALS: not wear a pad (1-2/day a pantyliner now but pads if she's sick or working, more if leaking)   BOWEL MOVEMENT: Pain with bowel movement: No Type of bowel movement:Frequency twice a day, dairy does cause diarrhea Fully empty rectum: Yes:   Leakage: No                                                  Caused by:  Bowel urgency: no Pads: No Fiber supplement/laxative No  URINATION: Pain with urination: No Fully empty bladder: No                                         Post-void dribble: No Stream: Strong Urgency: Yes  Frequency:during the day 10x/day and up to 24x/day                                                        Nocturia: Yes: three/night   Leakage: Urge to void, Walking to the bathroom, Coughing, Sneezing, Laughing, and Lifting Pads/briefs: Yes, 1-2 pantyliners per day or 1-2 pads if sick 2/2 SUI  INTERCOURSE:  Not currently sexually active as she does not have a partner at this time  PREGNANCY: Number of pregnancies: 2 Vaginal deliveries 2 Tearing No Episiotomy Yes  C-section deliveries 0 Currently pregnant No  PROLAPSE: None   OBJECTIVE:  Note: Objective measures were completed at Evaluation unless otherwise noted.    COGNITION: Overall cognitive status: Within functional limits for tasks assessed     SENSATION: Light touch: Appears intact   FUNCTIONAL TESTS:   Single leg stance:  Rt: incr. Postural sway  Lt: incr. Postural sway (worse than R) Sit-up  test: Squat: Bed mobility:  GAIT: Assistive device utilized: None Comments: decr. Trunk rot and stride length  POSTURE: increased lumbar lordosis, increased thoracic kyphosis, and anterior pelvic tilt   LUMBARAROM/PROM: WNL except for decr. B trunk rot and ext.(With concordant LBP)  A/PROM A/PROM  Eval (% available)  Flexion   Extension   Right lateral flexion   Left lateral flexion   Right rotation   Left rotation    (Blank rows = not tested)  LOWER EXTREMITY ROM: B hip ER/IR limited otherwise WNL  Active ROM Right eval Left eval  Hip flexion    Hip extension    Hip abduction    Hip adduction    Hip internal rotation    Hip external rotation    Knee flexion    Knee extension    Ankle dorsiflexion    Ankle plantarflexion    Ankle inversion    Ankle eversion     (Blank rows = not tested)  LOWER EXTREMITY MMT:  MMT Right eval Left eval  Hip flexion 4 4  Hip extension    Hip abduction 2 2  Hip adduction 3 3  Hip internal rotation 3+ 3+  Hip external rotation 3+ 3+  Knee flexion 4- 4  Knee extension 4+ 4+  Ankle dorsiflexion 5 5  Ankle plantarflexion    Ankle inversion    Ankle eversion     (Blank rows = not tested) PALPATION:  General: no TTP over cx-lx spine in standing. 11/25: no TTP, DR 1.5-2 finger's width at and sup. To umbilicus, incr. Infrasternal angle, incr. Abdominal habitus, limited B hip (IR, ER, more so on R side), hypomobility of tx spine, pt noted to contract glutes during PFM contraction.   PELVIC MMT:   MMT eval  Vaginal   Internal Anal Sphincter   External Anal Sphincter   Puborectalis   (Blank rows = not tested)        TONE: Decr. tone  PROLAPSE: None reported.   TODAY'S TREATMENT:                                                                                                                              DATE: 02/05/24   Physical function test: PT completed exam (palpation, MMT, DR, ROM). See above for  details.  NMR: Access Code: [email or web address] URL: [email or web address] Date: 02/05/2024 Prepared by: Delon Pinal  Exercises - Supine Angels  - 1 x daily - 7 x weekly - 1 sets - 10 reps - Sidelying Diaphragmatic Breathing  - 1 x daily - 7 x weekly - 1 sets - 5 reps - Sidelying Open Book  - 1 x daily - 7 x weekly - 1 sets - 10 reps Cues and demo for proper technique. S for safety. No pain reported. Performed seated or in standing.  SELF CARE:  PATIENT EDUCATION:  Education details: PT educated pt exam findings and HEP to down regulate CNS and improve posture to decr. IAP on PFM. Person educated: Patient Education method: Explanation, Demonstration, and Handouts Education comprehension: verbalized understanding, returned demonstration, and needs further education  HOME EXERCISE PROGRAM: [email or web address]  ASSESSMENT:  CLINICAL IMPRESSION: Skilled session focused on completing exam (difficulty coordinating breath with PFM contraction without glute compensation and cues for breath, decr. Lat/post Rib translation with inhale, TTP -none and hypomobility of tx spine and limited B hip IR/ER, decr. Hip strength). The following impairments were noted upon exam: limited ROM, postural dysfunction, decr. Strength likely 2/2 subjective reports and gait deviations, nocturia, mixed UI. Pt would continue to benefit from skilled PT to improve safety and decr. Pain during all ADLs.   OBJECTIVE IMPAIRMENTS: Abnormal gait, decreased balance, decreased coordination, decreased endurance, decreased mobility, decreased ROM, decreased strength, hypomobility, increased fascial restrictions, and postural dysfunction.   ACTIVITY LIMITATIONS: carrying, lifting, bending, standing, squatting, sleeping, transfers, continence, toileting, and locomotion level  PARTICIPATION LIMITATIONS: meal prep, cleaning, laundry, shopping, community activity, and occupation  PERSONAL FACTORS: Age, Past/current experiences,  Time since onset of injury/illness/exacerbation,  and 3+ comorbidities: see above are also affecting patient's functional outcome.   REHAB POTENTIAL: Good  CLINICAL DECISION MAKING: Stable/uncomplicated  EVALUATION COMPLEXITY: Low   GOALS: Goals reviewed with patient? Yes  SHORT TERM GOALS: Target date: for all STGs: 02/28/24  Pt will be IND in HEP to improve pain, strength, coordination. Baseline: no HEP (was going to the gym and walking but then was sick with COVID for 6 weeks) Goal status: INITIAL  2.  Finish exam and write goals as indicated. Baseline: limited by time constraints Goal status: MET  3.  Pt will demo proper toileting posture to fully empty bladder and reduce straining during bowel movement. Baseline: unable to demo Goal status: INITIAL  4.  Pt will demonstrated improved relaxation and contraction of PFM with coordination of breath to reduce urinary leakage to </=four/week. Baseline: daily (1-2 pantyliners or pads) Goal status: INITIAL  LONG TERM GOALS: Target date: for all the LTGs: 03/27/24  Pt will demonstrated improved relaxation and contraction of PFM with coordination of breath to reduce urinary leakage to </=once/week. Baseline: daily (1-2 pantyliners or pads) Goal status: INITIAL  2.  Pt will report going back to the gym for classes to maximize gains made in PT. Baseline: no HEP Goal status: INITIAL  3.  Pt will demonstrate improved bladder behaviors and improved coordination of PFM with breath to decr. Nocturia for </=1/night. Baseline: 3x/night Goal status: INITIAL  4.  Pt will demonstrate improved bladder behaviors and improved coordination of PFM with breath to decr. Voiding frequency to once every 2-3 hours. Baseline: avg: 10x/day and up to 24x/day Goal status: INITIAL   PLAN: review and add strengthening HEP.   PT FREQUENCY: 1x/week  PT DURATION: 8 weeks  PLANNED INTERVENTIONS: 97164- PT Re-evaluation, 97110-Therapeutic exercises,  97530- Therapeutic activity, 97112- Neuromuscular re-education, 97535- Self Care, [id number]- Manual therapy, [phone]- Gait training, [phone] (1-2 muscles), [phone] (3+ muscles)- Dry Needling, Patient/Family education, Balance training, Taping, Joint mobilization, Spinal mobilization, Scar mobilization, Cryotherapy, Moist heat, and Biofeedback    Josephine Rudnick L, PT 02/05/2024, 9:40 AM  Delon Pinal, PT,DPT 02/05/24 9:40 AM Phone: [phone] Fax: [phone]

## 2024-02-05 NOTE — Patient Instructions (Signed)
Bladder diary:  ~Urinating every 2-3 hours ~Urine stream for at least 8 seconds. ~Urine color should be light, lemonade  ~If you have the urge, perform deep breathing for 5 reps and then 5-10 reps of pelvic floor contraction.  ~When do you feel the urge?

## 2024-02-11 ENCOUNTER — Encounter: Payer: Self-pay | Admitting: Obstetrics and Gynecology

## 2024-02-12 ENCOUNTER — Other Ambulatory Visit: Payer: Self-pay

## 2024-02-12 ENCOUNTER — Ambulatory Visit

## 2024-02-12 DIAGNOSIS — R2689 Other abnormalities of gait and mobility: Secondary | ICD-10-CM | POA: Insufficient documentation

## 2024-02-12 DIAGNOSIS — M6281 Muscle weakness (generalized): Secondary | ICD-10-CM | POA: Diagnosis present

## 2024-02-12 DIAGNOSIS — N3281 Overactive bladder: Secondary | ICD-10-CM | POA: Diagnosis present

## 2024-02-12 DIAGNOSIS — N393 Stress incontinence (female) (male): Secondary | ICD-10-CM | POA: Insufficient documentation

## 2024-02-12 NOTE — Therapy (Addendum)
 OUTPATIENT PHYSICAL THERAPY FEMALE PELVIC TREATMENT    Patient Name: Danielle Duarte MRN: [id number] DOB:1960-09-28, 63 y.o., female Today's Date: 02/12/2024  END OF SESSION:  PT End of Session - 02/12/24 1405     Visit Number 3    Number of Visits 9    Date for Recertification  03/31/24    Authorization Type [employer]    Progress Note Due on Visit 10    PT Start Time 1401    PT Stop Time 1440    PT Time Calculation (min) 39 min    Activity Tolerance Patient tolerated treatment well    Behavior During Therapy WFL for tasks assessed/performed          Past Medical History:  Diagnosis Date   Abnormal Pap smear    Allergic rhinitis    Allergic rhinitis with postnasal drip    Arthritis    right knee   Asthma    Dysplastic nevus 03/24/2019   spinal mid upper back, mod atypia    Fibroids    H/O total hip arthroplasty, left 04/19/2015   Hx of total knee arthroplasty, right 08/26/2018   Hypothyroidism    Normal cytology on  Pap smear with positive HPV test 06/12/2012   Repeat cotesting in one year    Paroxysmal supraventricular tachycardia 01/05/2014   PONV (postoperative nausea and vomiting)    Refractory chronic cough    Past Surgical History:  Procedure Laterality Date   APPENDECTOMY  12/1999   COLONOSCOPY  10/10/2010   COLONOSCOPY N/A 01/21/2024   Procedure: COLONOSCOPY;  Surgeon: Unk [name], MD;  Location: [facility];  Service: Endoscopy;  Laterality: N/A;   COLONOSCOPY WITH PROPOFOL  N/A 07/26/2020   Procedure: COLONOSCOPY WITH PROPOFOL ;  Surgeon: Unk [name], MD;  Location: [facility];  Service: Gastroenterology;  Laterality: N/A;  Patient tested positive for COVID on 06/11/20.  She will email office a copy of these results.   COLPOSCOPY  03/01/2010   HERNIA REPAIR  01/2013   right abdominal wall, with mesh   LAPAROSCOPIC LYSIS OF ADHESIONS  02/28/2012   Procedure: LAPAROSCOPIC LYSIS OF ADHESIONS;  Surgeon: [name],  MD;  Location: [facility];  Service: Gynecology;  Laterality: N/A;   LAPAROSCOPY  02/28/2012   Procedure: LAPAROSCOPY OPERATIVE;  Surgeon: [name], MD;  Location: [facility];  Service: Gynecology;  Laterality: N/A;   R knee meniscus repair Right 2007   SALPINGOOPHORECTOMY  02/28/2012   Procedure: SALPINGO OOPHORECTOMY;  Surgeon: [name], MD;  Location: [facility];  Service: Gynecology;  Laterality: Left;   TOTAL HIP ARTHROPLASTY  04/19/2015   Left   TOTAL KNEE ARTHROPLASTY Right 08/26/2018   Dr. Burnard at [facility]   Patient Active Problem List   Diagnosis Date Noted   SUI (stress urinary incontinence, female) 01/16/2024   Overactive bladder 01/16/2024   Family history of osteoporosis 05/11/2020   ASCUS with positive high risk HPV cervical pap on 04/20/2020 04/29/2020   History of PSVT (paroxysmal supraventricular tachycardia) 08/26/2018   Immune to varicella 04/09/2018   Vaginal dryness, menopausal 10/25/2017   Varicose veins of leg with swelling, 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/18/2017   Inclusion cyst of vulva 08/15/2016   Status post left hip replacement 04/19/2015   Allergic rhinitis 03/30/2015   Allergic asthma 03/30/2015   Direct hernia 03/30/2015   Globus sensation 03/30/2015   HLD (hyperlipidemia) 03/30/2015   BMI 34.0-34.9,adult 03/30/2015   Beat, premature ventricular 03/30/2015   Nail deformity 03/30/2015   Degenerative  arthritis of hip 01/14/2015   Arthritis of knee, degenerative 01/14/2015   Hypothyroid 12/07/2014   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 Sadie  REFERRING PROVIDER: Rosaline Caper, MD  REFERRING DIAG: SUI and overactive bladder  THERAPY DIAG:  SUI (stress urinary incontinence, female)  Muscle weakness (generalized)  Other abnormalities of gait and mobility  Overactive bladder  Rationale for Evaluation and Treatment: Rehabilitation  ONSET DATE: 01/16/24 referral date but going on for years  SUBJECTIVE:                                                                                                                                                                                            SUBJECTIVE STATEMENT: Did have a coughing spell and had to change light pad because she leaked. She has appt with pulmonologist on Thursday re: CT scan showing spot on lungs. She did a lot of standing and cooking over Thanksgiving, so she didn't do HEP as much.    Eval: URINARY FUNCTION: gets up three times/night, has recorded 24x/day one day. Pt voids at least ten times a day. Pt denied pain with voiding. Not sure if she's fully emptying bladder. 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 Mixed UI.  BOWEL FUNCTION: about twice a day. 75% of the time stool is normal. She knows food that triggers her: dairy. Pt denied pain with BM. She might have a hemorrhoids. Pt does not take fiber, laxatives, stool softeners.  CORE STABILITY: appendix, ovary and hernia repairs via abdominal. Core feels weak. No chronic back pain. Denied any injuries to coccyx or hips.  SEXUAL FUNCTION: not sexually active (no partner) but hx of very painful periods prior to children. No issues with climaxing. OBGYN exams are not painful.  Fluid intake: drinks mostly water  (likes ice water ), sweet tea or soda/day, sometimes coffee or juice, beer or wine rarely   FUNCTIONAL LIMITATIONS: leak bending forward, coughing, sneezing and she's afraid of leaking when she goes out  PERTINENT HISTORY:  Medications for current condition: has not started yet Surgeries: not for this Other: R knee arthritis, asthma, hx of ovarian fibroids, hypothyroidism, HPV, paroxysmal supraventricular tachycardia, appendectomy, R ab wall hernia repair, oophorectomy, R TKA and L THA Sexual abuse: No  PAIN:  Are you having pain? No 02/12/24 NPRS scale: 0/10  PRECAUTIONS: None  RED FLAGS: None   WEIGHT BEARING RESTRICTIONS: No  FALLS:  Has patient fallen in last 6 months?  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  ACTIVITY LEVEL : trying to get back into the gym (but  was sick with COVID for six weeks this fall), likes to walking and water  aerobics  PLOF: Independent  PATIENT GOALS: not wear a pad (1-2/day a pantyliner now but pads if she's sick or working, more if leaking)   BOWEL MOVEMENT: Pain with bowel movement: No Type of bowel movement:Frequency twice a day, dairy does cause diarrhea Fully empty rectum: Yes:   Leakage: No                                                  Caused by:  Bowel urgency: no Pads: No Fiber supplement/laxative No  URINATION: Pain with urination: No Fully empty bladder: No                                         Post-void dribble: No Stream: Strong Urgency: Yes  Frequency:during the day 10x/day and up to 24x/day                                                        Nocturia: Yes: three/night   Leakage: Urge to void, Walking to the bathroom, Coughing, Sneezing, Laughing, and Lifting Pads/briefs: Yes, 1-2 pantyliners per day or 1-2 pads if sick 2/2 SUI  INTERCOURSE:  Not currently sexually active as she does not have a partner at this time  PREGNANCY: Number of pregnancies: 2 Vaginal deliveries 2 Tearing No Episiotomy Yes  C-section deliveries 0 Currently pregnant No  PROLAPSE: None   OBJECTIVE:  Note: Objective measures were completed at Evaluation unless otherwise noted.    COGNITION: Overall cognitive status: Within functional limits for tasks assessed     SENSATION: Light touch: Appears intact   FUNCTIONAL TESTS:   Single leg stance:  Rt: incr. Postural sway  Lt: incr. Postural sway (worse than R) Sit-up test: Squat: Bed mobility:  GAIT: Assistive device utilized: None Comments: decr. Trunk rot and stride length  POSTURE: increased lumbar lordosis, increased thoracic kyphosis, and anterior pelvic tilt   LUMBARAROM/PROM: WNL except for decr. B trunk  rot and ext.(With concordant LBP)  A/PROM A/PROM  Eval (% available)  Flexion   Extension   Right lateral flexion   Left lateral flexion   Right rotation   Left rotation    (Blank rows = not tested)  LOWER EXTREMITY ROM: B hip ER/IR limited otherwise WNL  Active ROM Right eval Left eval  Hip flexion    Hip extension    Hip abduction    Hip adduction    Hip internal rotation    Hip external rotation    Knee flexion    Knee extension    Ankle dorsiflexion    Ankle plantarflexion    Ankle inversion    Ankle eversion     (Blank rows = not tested)  LOWER EXTREMITY MMT:  MMT Right eval Left eval  Hip flexion 4 4  Hip extension    Hip abduction 2 2  Hip adduction 3 3  Hip internal rotation 3+ 3+  Hip external rotation 3+ 3+  Knee flexion 4- 4  Knee extension 4+ 4+  Ankle  dorsiflexion 5 5  Ankle plantarflexion    Ankle inversion    Ankle eversion     (Blank rows = not tested) PALPATION:  General: no TTP over cx-lx spine in standing. 11/25: no TTP, DR 1.5-2 finger's width at and sup. To umbilicus, incr. Infrasternal angle, incr. Abdominal habitus, limited B hip (IR, ER, more so on R side), hypomobility of tx spine, pt noted to contract glutes during PFM contraction.   PELVIC MMT:   MMT eval  Vaginal   Internal Anal Sphincter   External Anal Sphincter   Puborectalis   (Blank rows = not tested)        TONE: Decr. tone  PROLAPSE: None reported.   TODAY'S TREATMENT:                                                                                                                              DATE: 02/12/24    NMR: Access Code: [email or web address] URL: [email or web address] Date: 02/12/2024 Prepared by: Delon Pinal  Exercises: performed 5 reps of previous HEP to ensure proper technique. - Supine Angels  - 1 x daily - 7 x weekly - 1 sets - 10 reps - Sidelying Diaphragmatic Breathing  - 1 x daily - 7 x weekly - 1 sets - 5 reps - Sidelying Open  Book  - 1 x daily - 7 x weekly - 1 sets - 10 reps NEW: - Seated Pelvic Floor Contraction  - 1 x daily - 7 x weekly - 1 sets - 5 reps performed in supine but pt held breath and activated glutes. - Sidelying Pelvic Floor Contraction with Self-Palpation  - 1-2 x daily - 7 x weekly - 1 sets - 10 reps - Standing Marching  - 1 x daily - 7 x weekly - 3 sets - 10 reps with abdominal activation - Standing Hip Abduction with Counter Support  - 1 x daily - 3 x weekly - 3 sets - 10 reps Cues and demo for proper technique. S for safety. No pain reported.    SELF CARE:  PATIENT EDUCATION:  Education details: PT educated pt on new HEP and to cease PFM contractions if leakage incr. And the benefits of internal muscle assessment next session, pt agreeable. Person educated: Patient Education method: Explanation, Demonstration, and Handouts Education comprehension: verbalized understanding, returned demonstration, and needs further education  HOME EXERCISE PROGRAM: [email or web address]  ASSESSMENT:  CLINICAL IMPRESSION: Skilled session focused on progressing HEP to improve PFM, hip and LE strength in order to decr. PFM tension and leakage. Pt required max cues during PFM contraction but progressed to IND performing in seated vs. Supine. The following impairments were noted upon exam: limited ROM, postural dysfunction, decr. Strength likely 2/2 subjective reports and gait deviations, nocturia, mixed UI. Pt would continue to benefit from skilled PT to improve safety and decr. Pain during all ADLs.   OBJECTIVE IMPAIRMENTS: Abnormal gait, decreased balance, decreased coordination, decreased endurance, decreased mobility, decreased ROM,  decreased strength, hypomobility, increased fascial restrictions, and postural dysfunction.   ACTIVITY LIMITATIONS: carrying, lifting, bending, standing, squatting, sleeping, transfers, continence, toileting, and locomotion level  PARTICIPATION LIMITATIONS: meal prep, cleaning, laundry,  shopping, community activity, and occupation  PERSONAL FACTORS: Age, Past/current experiences, Time since onset of injury/illness/exacerbation, and 3+ comorbidities: see above are also affecting patient's functional outcome.   REHAB POTENTIAL: Good  CLINICAL DECISION MAKING: Stable/uncomplicated  EVALUATION COMPLEXITY: Low   GOALS: Goals reviewed with patient? Yes  SHORT TERM GOALS: Target date: for all STGs: 02/28/24  Pt will be IND in HEP to improve pain, strength, coordination. Baseline: no HEP (was going to the gym and walking but then was sick with COVID for 6 weeks) Goal status: INITIAL  2.  Finish exam and write goals as indicated. Baseline: limited by time constraints Goal status: MET  3.  Pt will demo proper toileting posture to fully empty bladder and reduce straining during bowel movement. Baseline: unable to demo Goal status: INITIAL  4.  Pt will demonstrated improved relaxation and contraction of PFM with coordination of breath to reduce urinary leakage to </=four/week. Baseline: daily (1-2 pantyliners or pads) Goal status: INITIAL  LONG TERM GOALS: Target date: for all the LTGs: 03/27/24  Pt will demonstrated improved relaxation and contraction of PFM with coordination of breath to reduce urinary leakage to </=once/week. Baseline: daily (1-2 pantyliners or pads) Goal status: INITIAL  2.  Pt will report going back to the gym for classes to maximize gains made in PT. Baseline: no HEP Goal status: INITIAL  3.  Pt will demonstrate improved bladder behaviors and improved coordination of PFM with breath to decr. Nocturia for </=1/night. Baseline: 3x/night Goal status: INITIAL  4.  Pt will demonstrate improved bladder behaviors and improved coordination of PFM with breath to decr. Voiding frequency to once every 2-3 hours. Baseline: avg: 10x/day and up to 24x/day Goal status: INITIAL   PLAN: check STGs, internal muscle assessment, review and add strengthening  HEP.   PT FREQUENCY: 1x/week  PT DURATION: 8 weeks  PLANNED INTERVENTIONS: 97164- PT Re-evaluation, 97110-Therapeutic exercises, 97530- Therapeutic activity, 97112- Neuromuscular re-education, 97535- Self Care, [id number]- Manual therapy, [phone]- Gait training, [phone] (1-2 muscles), [id number] (3+ muscles)- Dry Needling, Patient/Family education, Balance training, Taping, Joint mobilization, Spinal mobilization, Scar mobilization, Cryotherapy, Moist heat, and Biofeedback    Ryson Bacha L, PT 02/12/2024, 2:08 PM  Delon Pinal, PT,DPT 02/12/24 2:08 PM Phone: [phone] Fax: [phone]

## 2024-02-19 ENCOUNTER — Other Ambulatory Visit: Payer: Self-pay | Admitting: Internal Medicine

## 2024-02-19 DIAGNOSIS — Z8679 Personal history of other diseases of the circulatory system: Secondary | ICD-10-CM

## 2024-02-19 DIAGNOSIS — R0602 Shortness of breath: Secondary | ICD-10-CM

## 2024-02-22 ENCOUNTER — Telehealth (HOSPITAL_COMMUNITY): Payer: Self-pay | Admitting: *Deleted

## 2024-02-22 ENCOUNTER — Encounter (HOSPITAL_COMMUNITY): Payer: Self-pay

## 2024-02-22 NOTE — Telephone Encounter (Signed)
 Attempted to call patient regarding upcoming cardiac CT appointment. Left message on voicemail with name and callback number Sid Seats RN Navigator Cardiac Imaging [facility] [phone] Office

## 2024-02-25 ENCOUNTER — Ambulatory Visit: Admission: RE | Admit: 2024-02-25 | Discharge: 2024-02-25 | Attending: Internal Medicine | Admitting: Internal Medicine

## 2024-02-25 DIAGNOSIS — I2510 Atherosclerotic heart disease of native coronary artery without angina pectoris: Secondary | ICD-10-CM | POA: Insufficient documentation

## 2024-02-25 DIAGNOSIS — Z8679 Personal history of other diseases of the circulatory system: Secondary | ICD-10-CM | POA: Diagnosis present

## 2024-02-25 DIAGNOSIS — R0602 Shortness of breath: Secondary | ICD-10-CM | POA: Insufficient documentation

## 2024-02-25 MED ORDER — NITROGLYCERIN 0.4 MG SL SUBL
0.8000 mg | SUBLINGUAL_TABLET | Freq: Once | SUBLINGUAL | Status: AC
  Administered 2024-02-25: 15:00:00 0.8 mg via SUBLINGUAL
  Filled 2024-02-25: qty 25

## 2024-02-25 MED ORDER — IOHEXOL 350 MG/ML SOLN
100.0000 mL | Freq: Once | INTRAVENOUS | Status: AC | PRN
  Administered 2024-02-25: 15:00:00 100 mL via INTRAVENOUS

## 2024-02-25 MED ORDER — DILTIAZEM HCL 25 MG/5ML IV SOLN
10.0000 mg | INTRAVENOUS | Status: DC | PRN
  Filled 2024-02-25: qty 5

## 2024-02-25 MED ORDER — METOPROLOL TARTRATE 5 MG/5ML IV SOLN
10.0000 mg | Freq: Once | INTRAVENOUS | Status: DC | PRN
  Filled 2024-02-25: qty 10

## 2024-02-25 NOTE — Progress Notes (Signed)
 Patient tolerated procedure well. Ambulate w/o difficulty. Denies any lightheadedness or being dizzy. Pt denies any pain at this time. Sitting in chair. Pt is encouraged to drink additional water throughout the day and reason explained to patient. Patient verbalized understanding and all questions answered. ABC intact. No further needs at this time. Discharge from procedure area w/o issues.

## 2024-02-26 ENCOUNTER — Ambulatory Visit

## 2024-02-27 ENCOUNTER — Ambulatory Visit: Admitting: Obstetrics and Gynecology

## 2024-03-03 ENCOUNTER — Ambulatory Visit

## 2024-03-10 ENCOUNTER — Other Ambulatory Visit: Payer: Self-pay

## 2024-03-10 ENCOUNTER — Ambulatory Visit

## 2024-03-10 DIAGNOSIS — N3281 Overactive bladder: Secondary | ICD-10-CM

## 2024-03-10 DIAGNOSIS — R2689 Other abnormalities of gait and mobility: Secondary | ICD-10-CM

## 2024-03-10 DIAGNOSIS — M6281 Muscle weakness (generalized): Secondary | ICD-10-CM

## 2024-03-10 DIAGNOSIS — N393 Stress incontinence (female) (male): Secondary | ICD-10-CM

## 2024-03-10 NOTE — Therapy (Signed)
 " OUTPATIENT PHYSICAL THERAPY FEMALE PELVIC TREATMENT    Patient Name: Danielle Duarte MRN: [id number] DOB:10-02-1960, 63 y.o., female Today's Date: 03/10/2024  END OF SESSION:  PT End of Session - 03/10/24 1456     Visit Number 4    Number of Visits 9    Date for Recertification  03/31/24    Authorization Type [employer]    Progress Note Due on Visit 10    PT Start Time 1448    PT Stop Time 1528    PT Time Calculation (min) 40 min    Activity Tolerance Patient tolerated treatment well    Behavior During Therapy [facility] for tasks assessed/performed           Past Medical History:  Diagnosis Date   Abnormal Pap smear    Allergic rhinitis    Allergic rhinitis with postnasal drip    Arthritis    right knee   Asthma    Dysplastic nevus 03/24/2019   spinal mid upper back, mod atypia    Fibroids    H/O total hip arthroplasty, left 04/19/2015   Hx of total knee arthroplasty, right 08/26/2018   Hypothyroidism    Normal cytology on  Pap smear with positive HPV test 06/12/2012   Repeat cotesting in one year    Paroxysmal supraventricular tachycardia 01/05/2014   PONV (postoperative nausea and vomiting)    Refractory chronic cough    Past Surgical History:  Procedure Laterality Date   APPENDECTOMY  12/1999   COLONOSCOPY  10/10/2010   COLONOSCOPY N/A 01/21/2024   Procedure: COLONOSCOPY;  Surgeon: Unk [name], MD;  Location: [facility];  Service: Endoscopy;  Laterality: N/A;   COLONOSCOPY WITH PROPOFOL  N/A 07/26/2020   Procedure: COLONOSCOPY WITH PROPOFOL ;  Surgeon: Unk [name], MD;  Location: [facility];  Service: Gastroenterology;  Laterality: N/A;  Patient tested positive for COVID on 06/11/20.  She will email office a copy of these results.   COLPOSCOPY  03/01/2010   HERNIA REPAIR  01/2013   right abdominal wall, with mesh   LAPAROSCOPIC LYSIS OF ADHESIONS  02/28/2012   Procedure: LAPAROSCOPIC LYSIS OF ADHESIONS;  Surgeon: [name],  MD;  Location: [facility];  Service: Gynecology;  Laterality: N/A;   LAPAROSCOPY  02/28/2012   Procedure: LAPAROSCOPY OPERATIVE;  Surgeon: [name], MD;  Location: [facility];  Service: Gynecology;  Laterality: N/A;   R knee meniscus repair Right 2007   SALPINGOOPHORECTOMY  02/28/2012   Procedure: SALPINGO OOPHORECTOMY;  Surgeon: [name], MD;  Location: [facility];  Service: Gynecology;  Laterality: Left;   TOTAL HIP ARTHROPLASTY  04/19/2015   Left   TOTAL KNEE ARTHROPLASTY Right 08/26/2018   Dr[facility] Burnard [facility]   Patient Active Problem List   Diagnosis Date Noted   SUI (stress urinary incontinence, female) 01/16/2024   Overactive bladder 01/16/2024   Family history of osteoporosis 05/11/2020   ASCUS with positive high risk HPV cervical pap on 04/20/2020 04/29/2020   History of PSVT (paroxysmal supraventricular tachycardia) 08/26/2018   Immune to varicella 04/09/2018   Vaginal dryness, menopausal 10/25/2017   Varicose veins of leg with swelling, 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/18/2017   Inclusion cyst of vulva 08/15/2016   Status post left hip replacement 04/19/2015   Allergic rhinitis 03/30/2015   Allergic asthma 03/30/2015   Direct hernia 03/30/2015   Globus sensation 03/30/2015   HLD (hyperlipidemia) 03/30/2015   BMI 34.0-34.9,adult 03/30/2015   Beat, premature ventricular 03/30/2015   Nail deformity 03/30/2015  Degenerative arthritis of hip 01/14/2015   Arthritis of knee, degenerative 01/14/2015   Hypothyroid 12/07/2014   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 Sadie  REFERRING PROVIDER: Rosaline Caper, MD  REFERRING DIAG: SUI and overactive bladder  THERAPY DIAG:  SUI (stress urinary incontinence, female)  Muscle weakness (generalized)  Other abnormalities of gait and mobility  Overactive bladder  Rationale for Evaluation and Treatment: Rehabilitation  ONSET DATE: 01/16/24 referral date but going on for years  SUBJECTIVE:                                                                                                                                                                                            SUBJECTIVE STATEMENT: She has OSA (testing last week), and had a scan of her heart today-cardiologist said everything looks fine but he wants to incr. Cholesterol meds and needs to walk 30-45 minutes, five days a week and lose 10% of body weight. She will f/u in six months re: nodule on lung. Many appt's and holidays has made it difficult to perform HEP. Will be switching inhaler to Cymibort. Doesn't have a body pillow yet and is waiting for CPAP appt.   Eval: URINARY FUNCTION: gets up three times/night, has recorded 24x/day one day. Pt voids at least ten times a day. Pt denied pain with voiding. Not sure if she's fully emptying bladder. 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 Mixed UI.  BOWEL FUNCTION: about twice a day. 75% of the time stool is normal. She knows food that triggers her: dairy. Pt denied pain with BM. She might have a hemorrhoids. Pt does not take fiber, laxatives, stool softeners.  CORE STABILITY: appendix, ovary and hernia repairs via abdominal. Core feels weak. No chronic back pain. Denied any injuries to coccyx or hips.  SEXUAL FUNCTION: not sexually active (no partner) but hx of very painful periods prior to children. No issues with climaxing. OBGYN exams are not painful.  Fluid intake: drinks mostly water  (likes ice water ), sweet tea or soda/day, sometimes coffee or juice, beer or wine rarely   FUNCTIONAL LIMITATIONS: leak bending forward, coughing, sneezing and she's afraid of leaking when she goes out  PERTINENT HISTORY:  Medications for current condition: has not started yet Surgeries: not for this Other: R knee arthritis, asthma, hx of ovarian fibroids, hypothyroidism, HPV, paroxysmal supraventricular tachycardia, appendectomy, R ab wall hernia repair, oophorectomy, R TKA and L THA Sexual  abuse: No  PAIN:  Are you having pain? Yes 03/10/24 NPRS scale: 2/10 took an advil , diffuse pain in joints  PRECAUTIONS: None  RED FLAGS:  None   WEIGHT BEARING RESTRICTIONS: No  FALLS:  Has patient fallen in last 6 months? 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  ACTIVITY LEVEL : trying to get back into the gym (but was sick with COVID for six weeks this fall), likes to walking and water  aerobics  PLOF: Independent  PATIENT GOALS: not wear a pad (1-2/day a pantyliner now but pads if she's sick or working, more if leaking)   BOWEL MOVEMENT: Pain with bowel movement: No Type of bowel movement:Frequency twice a day, dairy does cause diarrhea Fully empty rectum: Yes:   Leakage: No                                                  Caused by:  Bowel urgency: no Pads: No Fiber supplement/laxative No  URINATION: Pain with urination: No Fully empty bladder: No                                         Post-void dribble: No Stream: Strong Urgency: Yes  Frequency:during the day 10x/day and up to 24x/day                                                        Nocturia: Yes: three/night   Leakage: Urge to void, Walking to the bathroom, Coughing, Sneezing, Laughing, and Lifting Pads/briefs: Yes, 1-2 pantyliners per day or 1-2 pads if sick 2/2 SUI  INTERCOURSE:  Not currently sexually active as she does not have a partner at this time  PREGNANCY: Number of pregnancies: 2 Vaginal deliveries 2 Tearing No Episiotomy Yes  C-section deliveries 0 Currently pregnant No  PROLAPSE: None   OBJECTIVE:  Note: Objective measures were completed at Evaluation unless otherwise noted.    COGNITION: Overall cognitive status: Within functional limits for tasks assessed     SENSATION: Light touch: Appears intact   FUNCTIONAL TESTS:   Single leg stance:  Rt: incr. Postural sway  Lt: incr. Postural sway (worse than R) Sit-up test: Squat: Bed  mobility:  GAIT: Assistive device utilized: None Comments: decr. Trunk rot and stride length  POSTURE: increased lumbar lordosis, increased thoracic kyphosis, and anterior pelvic tilt   LUMBARAROM/PROM: WNL except for decr. B trunk rot and ext.(With concordant LBP)  A/PROM A/PROM  Eval (% available)  Flexion   Extension   Right lateral flexion   Left lateral flexion   Right rotation   Left rotation    (Blank rows = not tested)  LOWER EXTREMITY ROM: B hip ER/IR limited otherwise WNL  Active ROM Right eval Left eval  Hip flexion    Hip extension    Hip abduction    Hip adduction    Hip internal rotation    Hip external rotation    Knee flexion    Knee extension    Ankle dorsiflexion    Ankle plantarflexion    Ankle inversion    Ankle eversion     (Blank rows = not tested)  LOWER EXTREMITY MMT:  MMT Right eval Left eval  Hip  flexion 4 4  Hip extension    Hip abduction 2 2  Hip adduction 3 3  Hip internal rotation 3+ 3+  Hip external rotation 3+ 3+  Knee flexion 4- 4  Knee extension 4+ 4+  Ankle dorsiflexion 5 5  Ankle plantarflexion    Ankle inversion    Ankle eversion     (Blank rows = not tested) PALPATION:  General: no TTP over cx-lx spine in standing. 11/25: no TTP, DR 1.5-2 finger's width at and sup. To umbilicus, incr. Infrasternal angle, incr. Abdominal habitus, limited B hip (IR, ER, more so on R side), hypomobility of tx spine, pt noted to contract glutes during PFM contraction.   PELVIC MMT:   MMT eval  Vaginal   Internal Anal Sphincter   External Anal Sphincter   Puborectalis   (Blank rows = not tested)        TONE: Decr. tone  PROLAPSE: None reported.   TODAY'S TREATMENT:                                                                                                                              DATE: 03/10/24    NMR: Access Code: [email or web address] URL: [email or web address] Date: 03/10/2024 Prepared by: Delon Pinal  Exercises - Seated Pelvic Floor Contraction  - 1 x daily - 7 x weekly - 1 sets - 10 reps and sidelying for PT to palpate and glutes noted to compensate but decr. With cues in seated position. - Standing Marching  - 1 x daily - 7 x weekly - 3 sets - 10 reps - Standing Hip Abduction with Counter Support  - 1 x daily - 3 x weekly - 3 sets - 10 reps - Mini Squat with Counter Support  - 1 x daily - 3 x weekly - 3 sets - 10 reps - Heel Raises with Counter Support  - 1 x daily - 3 x weekly - 3 sets - 10 reps with and without knees ext. Cues and demo for proper technique. S for safety. No pain reported. Hand support prn.    SELF CARE:  PATIENT EDUCATION:  Education details: PT educated pt on progressed HEP and STG progress. Person educated: Patient Education method: Explanation, Demonstration, and Handouts Education comprehension: verbalized understanding, returned demonstration, and needs further education  HOME EXERCISE PROGRAM: [email or web address]  ASSESSMENT:  CLINICAL IMPRESSION: Skilled session focused on assessing STGs, all met except for HEP (partially met) 2/2 pt not as consistent 2/2 holidays and MD appt's. Pt demonstrated progress as she met most STGs and tolerated progressed strengthening of PFM and LEs/hips. Pt required minimal cues today as well. The following impairments were noted upon exam: limited ROM, postural dysfunction, decr. Strength likely 2/2 subjective reports and gait deviations, nocturia, mixed UI. Pt would continue to benefit from skilled PT to improve safety and decr. Pain during all ADLs.   OBJECTIVE IMPAIRMENTS: Abnormal gait, decreased balance, decreased coordination, decreased endurance, decreased mobility, decreased  ROM, decreased strength, hypomobility, increased fascial restrictions, and postural dysfunction.   ACTIVITY LIMITATIONS: carrying, lifting, bending, standing, squatting, sleeping, transfers, continence, toileting, and locomotion level  PARTICIPATION  LIMITATIONS: meal prep, cleaning, laundry, shopping, community activity, and occupation  PERSONAL FACTORS: Age, Past/current experiences, Time since onset of injury/illness/exacerbation, and 3+ comorbidities: see above are also affecting patient's functional outcome.   REHAB POTENTIAL: Good  CLINICAL DECISION MAKING: Stable/uncomplicated  EVALUATION COMPLEXITY: Low   GOALS: Goals reviewed with patient? Yes  SHORT TERM GOALS: Target date: for all STGs: 02/28/24  Pt will be IND in HEP to improve pain, strength, coordination. Baseline: no HEP (was going to the gym and walking but then was sick with COVID for 6 weeks); 12/29: not as consistent 2/2 appt's and holidays Goal status: PARTIALLY MET  2.  Finish exam and write goals as indicated. Baseline: limited by time constraints Goal status: MET  3.  Pt will demo proper toileting posture to fully empty bladder and reduce straining during bowel movement. Baseline: unable to demo 12/29: able to demo and feels like it has helped not going as frequent Goal status: MET  4.  Pt will demonstrated improved relaxation and contraction of PFM with coordination of breath to reduce urinary leakage to </=four/week. Baseline: daily (1-2 pantyliners or pads). 12/29: maybe once a week now, with SUI (coughing, bending forward) Goal status: MET   LONG TERM GOALS: Target date: for all the LTGs: 03/27/24  Pt will demonstrated improved relaxation and contraction of PFM with coordination of breath to reduce urinary leakage to </=once/week. Baseline: daily (1-2 pantyliners or pads) Goal status: INITIAL  2.  Pt will report going back to the gym for classes to maximize gains made in PT. Baseline: no HEP Goal status: INITIAL  3.  Pt will demonstrate improved bladder behaviors and improved coordination of PFM with breath to decr. Nocturia for </=1/night. Baseline: 3x/night Goal status: INITIAL  4.  Pt will demonstrate improved bladder behaviors and  improved coordination of PFM with breath to decr. Voiding frequency to once every 2-3 hours. Baseline: avg: 10x/day and up to 24x/day Goal status: INITIAL   PLAN: internal muscle assessment, review and add strengthening HEP.   PT FREQUENCY: 1x/week  PT DURATION: 8 weeks  PLANNED INTERVENTIONS: 97164- PT Re-evaluation, 97110-Therapeutic exercises, 97530- Therapeutic activity, 97112- Neuromuscular re-education, 97535- Self Care, [id number]- Manual therapy, [phone]- Gait training, [phone] (1-2 muscles), [phone] (3+ muscles)- Dry Needling, Patient/Family education, Balance training, Taping, Joint mobilization, Spinal mobilization, Scar mobilization, Cryotherapy, Moist heat, and Biofeedback    Holdan Stucke L, PT 03/10/2024, 2:57 PM  Delon Pinal, PT,DPT 03/10/2024 2:57 PM Phone: [phone] Fax: [phone]  "

## 2024-03-17 ENCOUNTER — Other Ambulatory Visit: Payer: Self-pay

## 2024-03-17 ENCOUNTER — Ambulatory Visit: Attending: Obstetrics and Gynecology

## 2024-03-17 ENCOUNTER — Ambulatory Visit

## 2024-03-17 DIAGNOSIS — M6281 Muscle weakness (generalized): Secondary | ICD-10-CM | POA: Insufficient documentation

## 2024-03-17 DIAGNOSIS — N3281 Overactive bladder: Secondary | ICD-10-CM | POA: Insufficient documentation

## 2024-03-17 DIAGNOSIS — R2689 Other abnormalities of gait and mobility: Secondary | ICD-10-CM | POA: Insufficient documentation

## 2024-03-17 DIAGNOSIS — N393 Stress incontinence (female) (male): Secondary | ICD-10-CM | POA: Insufficient documentation

## 2024-03-17 NOTE — Therapy (Signed)
 " OUTPATIENT PHYSICAL THERAPY FEMALE PELVIC TREATMENT    Patient Name: Danielle Duarte MRN: [id number] DOB:09-Dec-1960, 64 y.o., female Today's Date: 03/17/2024  END OF SESSION:  PT End of Session - 03/17/24 1056     Visit Number 5    Number of Visits 9    Date for Recertification  03/31/24    Authorization Type [employer]    Progress Note Due on Visit 10    PT Start Time 1052    PT Stop Time 1132    PT Time Calculation (min) 40 min    Activity Tolerance Patient tolerated treatment well    Behavior During Therapy [facility] for tasks assessed/performed           Past Medical History:  Diagnosis Date   Abnormal Pap smear    Allergic rhinitis    Allergic rhinitis with postnasal drip    Arthritis    right knee   Asthma    Dysplastic nevus 03/24/2019   spinal mid upper back, mod atypia    Fibroids    H/O total hip arthroplasty, left 04/19/2015   Hx of total knee arthroplasty, right 08/26/2018   Hypothyroidism    Normal cytology on  Pap smear with positive HPV test 06/12/2012   Repeat cotesting in one year    Paroxysmal supraventricular tachycardia 01/05/2014   PONV (postoperative nausea and vomiting)    Refractory chronic cough    Past Surgical History:  Procedure Laterality Date   APPENDECTOMY  12/1999   COLONOSCOPY  10/10/2010   COLONOSCOPY N/A 01/21/2024   Procedure: COLONOSCOPY;  Surgeon: Unk [name], MD;  Location: [facility];  Service: Endoscopy;  Laterality: N/A;   COLONOSCOPY WITH PROPOFOL  N/A 07/26/2020   Procedure: COLONOSCOPY WITH PROPOFOL ;  Surgeon: Unk [name], MD;  Location: [facility];  Service: Gastroenterology;  Laterality: N/A;  Patient tested positive for COVID on 06/11/20.  She will email office a copy of these results.   COLPOSCOPY  03/01/2010   HERNIA REPAIR  01/2013   right abdominal wall, with mesh   LAPAROSCOPIC LYSIS OF ADHESIONS  02/28/2012   Procedure: LAPAROSCOPIC LYSIS OF ADHESIONS;  Surgeon: [name],  MD;  Location: [facility];  Service: Gynecology;  Laterality: N/A;   LAPAROSCOPY  02/28/2012   Procedure: LAPAROSCOPY OPERATIVE;  Surgeon: [name], MD;  Location: [facility];  Service: Gynecology;  Laterality: N/A;   R knee meniscus repair Right 2007   SALPINGOOPHORECTOMY  02/28/2012   Procedure: SALPINGO OOPHORECTOMY;  Surgeon: [name], MD;  Location: [facility];  Service: Gynecology;  Laterality: Left;   TOTAL HIP ARTHROPLASTY  04/19/2015   Left   TOTAL KNEE ARTHROPLASTY Right 08/26/2018   Dr. Burnard at [facility]   Patient Active Problem List   Diagnosis Date Noted   SUI (stress urinary incontinence, female) 01/16/2024   Overactive bladder 01/16/2024   Family history of osteoporosis 05/11/2020   ASCUS with positive high risk HPV cervical pap on 04/20/2020 04/29/2020   History of PSVT (paroxysmal supraventricular tachycardia) 08/26/2018   Immune to varicella 04/09/2018   Vaginal dryness, menopausal 10/25/2017   Varicose veins of leg with swelling, 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/18/2017   Inclusion cyst of vulva 08/15/2016   Status post left hip replacement 04/19/2015   Allergic rhinitis 03/30/2015   Allergic asthma 03/30/2015   Direct hernia 03/30/2015   Globus sensation 03/30/2015   HLD (hyperlipidemia) 03/30/2015   BMI 34.0-34.9,adult 03/30/2015   Beat, premature ventricular 03/30/2015   Nail deformity 03/30/2015  Degenerative arthritis of hip 01/14/2015   Arthritis of knee, degenerative 01/14/2015   Hypothyroid 12/07/2014   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 Sadie  REFERRING PROVIDER: Rosaline Caper, MD  REFERRING DIAG: SUI and overactive bladder  THERAPY DIAG:  SUI (stress urinary incontinence, female)  Muscle weakness (generalized)  Other abnormalities of gait and mobility  Overactive bladder  Rationale for Evaluation and Treatment: Rehabilitation  ONSET DATE: 01/16/24 referral date but going on for years  SUBJECTIVE:                                                                                                                                                                                            SUBJECTIVE STATEMENT: She would like to spread her appt's out to every other week. Pt reported leakage has been about the same since last week. 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    Eval: URINARY FUNCTION: gets up three times/night, has recorded 24x/day one day. Pt voids at least ten times a day. Pt denied pain with voiding. Not sure if she's fully emptying bladder. 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 Mixed UI.  BOWEL FUNCTION: about twice a day. 75% of the time stool is normal. She knows food that triggers her: dairy. Pt denied pain with BM. She might have a hemorrhoids. Pt does not take fiber, laxatives, stool softeners.  CORE STABILITY: appendix, ovary and hernia repairs via abdominal. Core feels weak. No chronic back pain. Denied any injuries to coccyx or hips.  SEXUAL FUNCTION: not sexually active (no partner) but hx of very painful periods prior to children. No issues with climaxing. OBGYN exams are not painful.  Fluid intake: drinks mostly water  (likes ice water ), sweet tea or soda/day, sometimes coffee or juice, beer or wine rarely   FUNCTIONAL LIMITATIONS: leak bending forward, coughing, sneezing and she's afraid of leaking when she goes out  PERTINENT HISTORY:  Medications for current condition: has not started yet Surgeries: not for this Other: R knee arthritis, asthma, hx of ovarian fibroids, hypothyroidism, HPV, paroxysmal supraventricular tachycardia, appendectomy, R ab wall hernia repair, oophorectomy, R TKA and L THA Sexual abuse: No  PAIN:  Are you having pain? No 03/18/23 NPRS scale: 0/10   PRECAUTIONS: None  RED FLAGS: None   WEIGHT BEARING RESTRICTIONS: No  FALLS:  Has patient fallen in last 6 months? 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  ACTIVITY LEVEL : trying to get back into the gym (but was sick with COVID for six weeks this fall), likes to  walking and water  aerobics  PLOF: Independent  PATIENT GOALS: not wear a pad (1-2/day a pantyliner now but pads if she's sick or working, more if leaking)   BOWEL MOVEMENT: Pain with bowel movement: No Type of bowel movement:Frequency twice a day, dairy does cause diarrhea Fully empty rectum: Yes:   Leakage: No                                                  Caused by:  Bowel urgency: no Pads: No Fiber supplement/laxative No  URINATION: Pain with urination: No Fully empty bladder: No                                         Post-void dribble: No Stream: Strong Urgency: Yes  Frequency:during the day 10x/day and up to 24x/day                                                        Nocturia: Yes: three/night   Leakage: Urge to void, Walking to the bathroom, Coughing, Sneezing, Laughing, and Lifting Pads/briefs: Yes, 1-2 pantyliners per day or 1-2 pads if sick 2/2 SUI  INTERCOURSE:  Not currently sexually active as she does not have a partner at this time  PREGNANCY: Number of pregnancies: 2 Vaginal deliveries 2 Tearing No Episiotomy Yes  C-section deliveries 0 Currently pregnant No  PROLAPSE: None   OBJECTIVE:  Note: Objective measures were completed at Evaluation unless otherwise noted.    COGNITION: Overall cognitive status: Within functional limits for tasks assessed     SENSATION: Light touch: Appears intact   FUNCTIONAL TESTS:   Single leg stance:  Rt: incr. Postural sway  Lt: incr. Postural sway (worse than R) Sit-up test: Squat: Bed mobility:  GAIT: Assistive device utilized: None Comments: decr. Trunk rot and stride length  POSTURE: increased lumbar lordosis, increased thoracic kyphosis, and anterior pelvic tilt   LUMBARAROM/PROM: WNL except for decr. B trunk rot and ext.(With concordant LBP)  A/PROM A/PROM  Eval (%  available)  Flexion   Extension   Right lateral flexion   Left lateral flexion   Right rotation   Left rotation    (Blank rows = not tested)  LOWER EXTREMITY ROM: B hip ER/IR limited otherwise WNL  Active ROM Right eval Left eval  Hip flexion    Hip extension    Hip abduction    Hip adduction    Hip internal rotation    Hip external rotation    Knee flexion    Knee extension    Ankle dorsiflexion    Ankle plantarflexion    Ankle inversion    Ankle eversion     (Blank rows = not tested)  LOWER EXTREMITY MMT:  MMT Right eval Left eval  Hip flexion 4 4  Hip extension    Hip abduction 2 2  Hip adduction 3 3  Hip internal rotation 3+ 3+  Hip external rotation 3+ 3+  Knee flexion 4- 4  Knee extension 4+ 4+  Ankle dorsiflexion 5 5  Ankle plantarflexion    Ankle inversion  Ankle eversion     (Blank rows = not tested) PALPATION:  General: no TTP over cx-lx spine in standing. 11/25: no TTP, DR 1.5-2 finger's width at and sup. To umbilicus, incr. Infrasternal angle, incr. Abdominal habitus, limited B hip (IR, ER, more so on R side), hypomobility of tx spine, pt noted to contract glutes during PFM contraction.   PELVIC MMT:   MMT eval  Vaginal   Internal Anal Sphincter   External Anal Sphincter   Puborectalis   (Blank rows = not tested)        TONE: Decr. tone  PROLAPSE: None reported.   TODAY'S TREATMENT:                                                                                                                              DATE: 03/17/24    NMR: Access Code: [email or web address] URL: [email or web address] Date: 03/17/2024 Prepared by: Delon Pinal  Exercises - Dead Bug  - 1 x daily - 3 x weekly - 4 sets - 5 reps Cues and demo for proper technique. S for safety. No pain reported. Hand support prn.  MANUAL THERAPY: Internal PFM assessment: pt agreeable to internal vaginal assessment. Pt performed diaphragmatic breathing to improve  relaxation of PFM and not to use glutes, B hip add or abdominals (rectus), 4/5 strength but unable to hold for longer than 2 sec and PFM tension continued after contraction until pt utilized breath work to relax PFM. Cues to improve PFM lengthening with inhalation vs. Contraction. L levator ani tension noted with PT performing trigger point release but still present. After manual therapy: no pain reported afterwards.   SELF CARE:  PATIENT EDUCATION:  Education details: PT educated pt on pelvic wand benefits and how to use with pelvic model and provided Slipper Stuff sample.SABRA Pelvic wand: insert thicker end for tense muscles closer to vaginal opening and hold over trigger point until you feel decr. Tension/pain and muscle melt, repeat as needed and use thinner end to reach pelvic muscles closer to tailbone.  Person educated: Patient Education method: Explanation, Demonstration, and Handouts Education comprehension: verbalized understanding, returned demonstration, and needs further education  HOME EXERCISE PROGRAM: [email or web address]  ASSESSMENT:  CLINICAL IMPRESSION: Skilled session focused on internal muscle assessment with decr. Endurance, L levator ani tension, and difficulty relaxing PFM b/t contractions noted. PT educated pt on benefits of pelvic wand but pt hesitant and will think about using it. Pt demonstrated progress as she tolerated progression to core activities: dead bug with some trunk movement noted and coordination cues needed. The following impairments were noted upon exam: limited ROM, postural dysfunction, decr. Strength likely 2/2 subjective reports and gait deviations, nocturia, mixed UI. Pt would continue to benefit from skilled PT to improve safety and decr. Pain during all ADLs. Reducing to every other week per pt request and progress based.   OBJECTIVE IMPAIRMENTS: Abnormal gait, decreased balance, decreased coordination, decreased endurance, decreased mobility, decreased ROM,  decreased  strength, hypomobility, increased fascial restrictions, and postural dysfunction.   ACTIVITY LIMITATIONS: carrying, lifting, bending, standing, squatting, sleeping, transfers, continence, toileting, and locomotion level  PARTICIPATION LIMITATIONS: meal prep, cleaning, laundry, shopping, community activity, and occupation  PERSONAL FACTORS: Age, Past/current experiences, Time since onset of injury/illness/exacerbation, and 3+ comorbidities: see above are also affecting patient's functional outcome.   REHAB POTENTIAL: Good  CLINICAL DECISION MAKING: Stable/uncomplicated  EVALUATION COMPLEXITY: Low   GOALS: Goals reviewed with patient? Yes  SHORT TERM GOALS: Target date: for all STGs: 02/28/24  Pt will be IND in HEP to improve pain, strength, coordination. Baseline: no HEP (was going to the gym and walking but then was sick with COVID for 6 weeks); 12/29: not as consistent 2/2 appt's and holidays Goal status: PARTIALLY MET  2.  Finish exam and write goals as indicated. Baseline: limited by time constraints Goal status: MET  3.  Pt will demo proper toileting posture to fully empty bladder and reduce straining during bowel movement. Baseline: unable to demo 12/29: able to demo and feels like it has helped not going as frequent Goal status: MET  4.  Pt will demonstrated improved relaxation and contraction of PFM with coordination of breath to reduce urinary leakage to </=four/week. Baseline: daily (1-2 pantyliners or pads). 12/29: maybe once a week now, with SUI (coughing, bending forward) Goal status: MET   LONG TERM GOALS: Target date: for all the LTGs: 03/27/24  Pt will demonstrated improved relaxation and contraction of PFM with coordination of breath to reduce urinary leakage to </=once/week. Baseline: daily (1-2 pantyliners or pads) Goal status: INITIAL  2.  Pt will report going back to the gym for classes to maximize gains made in PT. Baseline: no HEP Goal  status: INITIAL  3.  Pt will demonstrate improved bladder behaviors and improved coordination of PFM with breath to decr. Nocturia for </=1/night. Baseline: 3x/night Goal status: INITIAL  4.  Pt will demonstrate improved bladder behaviors and improved coordination of PFM with breath to decr. Voiding frequency to once every 2-3 hours. Baseline: avg: 10x/day and up to 24x/day Goal status: INITIAL   PLAN: continue to progress strengthening HEP. LTGs.   PT FREQUENCY: 1x/week  PT DURATION: 8 weeks  PLANNED INTERVENTIONS: 97164- PT Re-evaluation, 97110-Therapeutic exercises, 97530- Therapeutic activity, 97112- Neuromuscular re-education, 97535- Self Care, [id number]- Manual therapy, [phone]- Gait training, [phone] (1-2 muscles), 20561 (3+ muscles)- Dry Needling, Patient/Family education, Balance training, Taping, Joint mobilization, Spinal mobilization, Scar mobilization, Cryotherapy, Moist heat, and Biofeedback    Nykeem Citro L, PT 03/17/2024, 11:40 AM  Delon Pinal, PT,DPT 03/17/2024 11:40 AM Phone: [phone] Fax: [phone]  "

## 2024-03-24 ENCOUNTER — Ambulatory Visit

## 2024-03-24 ENCOUNTER — Encounter: Payer: Self-pay | Admitting: *Deleted

## 2024-03-27 ENCOUNTER — Ambulatory Visit

## 2024-03-31 ENCOUNTER — Ambulatory Visit

## 2024-04-01 ENCOUNTER — Ambulatory Visit

## 2024-04-07 ENCOUNTER — Ambulatory Visit

## 2024-04-14 ENCOUNTER — Ambulatory Visit

## 2024-04-15 ENCOUNTER — Encounter: Payer: Self-pay | Admitting: *Deleted

## 2024-04-21 ENCOUNTER — Ambulatory Visit

## 2024-04-22 ENCOUNTER — Ambulatory Visit: Admitting: Obstetrics and Gynecology

## 2024-04-24 ENCOUNTER — Ambulatory Visit

## 2024-05-05 ENCOUNTER — Ambulatory Visit: Admitting: Obstetrics and Gynecology
